# Patient Record
Sex: Male | Born: 1940 | Race: White | Hispanic: No | Smoking: Never smoker
Health system: Southern US, Community
[De-identification: ages and names within clinical notes are randomized; demographics above are authoritative.]

## PROBLEM LIST (undated history)

## (undated) DIAGNOSIS — I4891 Unspecified atrial fibrillation: Secondary | ICD-10-CM

## (undated) DIAGNOSIS — G629 Polyneuropathy, unspecified: Secondary | ICD-10-CM

## (undated) DIAGNOSIS — E119 Type 2 diabetes mellitus without complications: Secondary | ICD-10-CM

## (undated) DIAGNOSIS — Z8719 Personal history of other diseases of the digestive system: Secondary | ICD-10-CM

## (undated) DIAGNOSIS — I10 Essential (primary) hypertension: Secondary | ICD-10-CM

## (undated) DIAGNOSIS — I429 Cardiomyopathy, unspecified: Secondary | ICD-10-CM

## (undated) DIAGNOSIS — F419 Anxiety disorder, unspecified: Secondary | ICD-10-CM

## (undated) DIAGNOSIS — Z7901 Long term (current) use of anticoagulants: Secondary | ICD-10-CM

## (undated) HISTORY — DX: Type 2 diabetes mellitus without complications: E11.9

## (undated) HISTORY — DX: Unspecified atrial fibrillation: I48.91

## (undated) HISTORY — DX: Cardiomyopathy, unspecified: I42.9

## (undated) HISTORY — PX: COLONOSCOPY W/ POLYPECTOMY: SHX1380

## (undated) HISTORY — DX: Long term (current) use of anticoagulants: Z79.01

## (undated) HISTORY — DX: Essential (primary) hypertension: I10

## (undated) HISTORY — DX: Personal history of other diseases of the digestive system: Z87.19

## (undated) HISTORY — PX: TUMOR EXCISION: SHX421

## (undated) HISTORY — DX: Anxiety disorder, unspecified: F41.9

---
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
## 1969-03-11 HISTORY — PX: BACK SURGERY: SHX140

## 2008-12-13 ENCOUNTER — Ambulatory Visit (HOSPITAL_COMMUNITY): Admission: RE | Admit: 2008-12-13 | Discharge: 2008-12-13 | Payer: Self-pay | Admitting: Family Medicine

## 2009-03-11 DIAGNOSIS — Z8719 Personal history of other diseases of the digestive system: Secondary | ICD-10-CM

## 2009-03-11 HISTORY — DX: Personal history of other diseases of the digestive system: Z87.19

## 2009-08-18 ENCOUNTER — Ambulatory Visit (HOSPITAL_COMMUNITY): Admission: RE | Admit: 2009-08-18 | Discharge: 2009-08-18 | Payer: Self-pay | Admitting: Family Medicine

## 2009-08-24 ENCOUNTER — Ambulatory Visit (HOSPITAL_COMMUNITY): Admission: RE | Admit: 2009-08-24 | Discharge: 2009-08-24 | Payer: Self-pay | Admitting: Family Medicine

## 2009-09-10 ENCOUNTER — Ambulatory Visit: Payer: Self-pay | Admitting: Cardiovascular Disease

## 2009-09-10 ENCOUNTER — Inpatient Hospital Stay (HOSPITAL_COMMUNITY): Admission: EM | Admit: 2009-09-10 | Discharge: 2009-09-15 | Payer: Self-pay | Admitting: Emergency Medicine

## 2009-09-12 ENCOUNTER — Encounter (INDEPENDENT_AMBULATORY_CARE_PROVIDER_SITE_OTHER): Payer: Self-pay | Admitting: Family Medicine

## 2009-09-15 ENCOUNTER — Encounter (INDEPENDENT_AMBULATORY_CARE_PROVIDER_SITE_OTHER): Payer: Self-pay | Admitting: *Deleted

## 2009-09-15 LAB — CONVERTED CEMR LAB
ALT: 15 units/L
AST: 21 units/L
Alkaline Phosphatase: 63 units/L
BUN: 18 mg/dL
Basophils Absolute: 0.1 10*3/uL
Basophils Relative: 1 %
Brain Natriuretic Peptide: 106
Eosinophils Absolute: 0.3 10*3/uL
Eosinophils Relative: 4 %
GFR calc non Af Amer: >60 mL/min
Glomerular Filtration Rate, Af Am: >60 mL/min/{1.73_m2}
Glucose, Bld: 102 mg/dL
HDL: 35 mg/dL
Hemoglobin: 15.4 g/dL
LDL (calc): 52 mg/dL
MCHC: 32.3 g/dL
MCV: 83.8 fL
Monocytes Absolute: 1.3 10*3/uL
RBC: 5.7 M/uL

## 2009-10-12 ENCOUNTER — Encounter (INDEPENDENT_AMBULATORY_CARE_PROVIDER_SITE_OTHER): Payer: Self-pay | Admitting: *Deleted

## 2009-10-19 ENCOUNTER — Ambulatory Visit: Payer: Self-pay | Admitting: Cardiology

## 2009-10-19 DIAGNOSIS — F411 Generalized anxiety disorder: Secondary | ICD-10-CM | POA: Insufficient documentation

## 2009-10-20 ENCOUNTER — Encounter: Payer: Self-pay | Admitting: Cardiology

## 2009-11-09 ENCOUNTER — Ambulatory Visit: Payer: Self-pay | Admitting: Cardiology

## 2009-11-30 LAB — CONVERTED CEMR LAB: Prothrombin Time: 24.2 s

## 2009-12-01 LAB — CONVERTED CEMR LAB
Calcium: 9.3 mg/dL
Potassium: 4.5 meq/L
Sodium: 142 meq/L

## 2009-12-18 ENCOUNTER — Ambulatory Visit: Payer: Self-pay | Admitting: Cardiology

## 2009-12-18 ENCOUNTER — Encounter (INDEPENDENT_AMBULATORY_CARE_PROVIDER_SITE_OTHER): Payer: Self-pay | Admitting: *Deleted

## 2009-12-18 LAB — CONVERTED CEMR LAB
ALT: 19 units/L (ref 0–53)
AST: 24 units/L (ref 0–37)
Albumin: 4.7 g/dL (ref 3.5–5.2)
Alkaline Phosphatase: 62 units/L (ref 39–117)
Potassium: 4.5 meq/L (ref 3.5–5.3)
Sodium: 142 meq/L (ref 135–145)
Total Protein: 6.9 g/dL (ref 6.0–8.3)

## 2009-12-20 ENCOUNTER — Observation Stay (HOSPITAL_COMMUNITY): Admission: AD | Admit: 2009-12-20 | Discharge: 2009-12-20 | Payer: Self-pay | Admitting: Cardiology

## 2009-12-20 ENCOUNTER — Encounter: Payer: Self-pay | Admitting: Cardiology

## 2009-12-20 ENCOUNTER — Telehealth (INDEPENDENT_AMBULATORY_CARE_PROVIDER_SITE_OTHER): Payer: Self-pay | Admitting: *Deleted

## 2009-12-21 ENCOUNTER — Encounter (INDEPENDENT_AMBULATORY_CARE_PROVIDER_SITE_OTHER): Payer: Self-pay | Admitting: *Deleted

## 2009-12-25 ENCOUNTER — Encounter (INDEPENDENT_AMBULATORY_CARE_PROVIDER_SITE_OTHER): Payer: Self-pay | Admitting: *Deleted

## 2009-12-27 ENCOUNTER — Encounter (INDEPENDENT_AMBULATORY_CARE_PROVIDER_SITE_OTHER): Payer: Self-pay | Admitting: *Deleted

## 2009-12-27 ENCOUNTER — Ambulatory Visit: Payer: Self-pay | Admitting: Cardiology

## 2009-12-28 ENCOUNTER — Encounter (INDEPENDENT_AMBULATORY_CARE_PROVIDER_SITE_OTHER): Payer: Self-pay | Admitting: *Deleted

## 2009-12-29 ENCOUNTER — Telehealth (INDEPENDENT_AMBULATORY_CARE_PROVIDER_SITE_OTHER): Payer: Self-pay | Admitting: *Deleted

## 2010-01-02 ENCOUNTER — Encounter: Payer: Self-pay | Admitting: Cardiology

## 2010-01-02 ENCOUNTER — Encounter (INDEPENDENT_AMBULATORY_CARE_PROVIDER_SITE_OTHER): Payer: Self-pay | Admitting: *Deleted

## 2010-01-02 LAB — CONVERTED CEMR LAB
Eosinophils Absolute: 0.3 10*3/uL
Eosinophils Relative: 0.3 %
Hemoglobin: 15.2 g/dL
Lymphs Abs: 2.9 10*3/uL
MCHC: 32.8 g/dL
RBC: 5.57 M/uL
UIBC: 340 ug/dL

## 2010-01-05 ENCOUNTER — Encounter (INDEPENDENT_AMBULATORY_CARE_PROVIDER_SITE_OTHER): Payer: Self-pay | Admitting: *Deleted

## 2010-01-05 LAB — CONVERTED CEMR LAB
Basophils Absolute: 0 10*3/uL (ref 0.0–0.1)
Hemoglobin: 15.2 g/dL (ref 13.0–17.0)
Lymphocytes Relative: 35 % (ref 12–46)
Lymphs Abs: 2.9 10*3/uL (ref 0.7–4.0)
Monocytes Absolute: 0.9 10*3/uL (ref 0.1–1.0)
Monocytes Relative: 10 % (ref 3–12)
Neutro Abs: 4.2 10*3/uL (ref 1.7–7.7)
RBC: 5.57 M/uL (ref 4.22–5.81)
RDW: 15.2 % (ref 11.5–15.5)
Saturation Ratios: 16 % — ABNORMAL LOW (ref 20–55)
TIBC: 404 ug/dL (ref 215–435)
WBC: 8.3 10*3/uL (ref 4.0–10.5)

## 2010-01-11 ENCOUNTER — Ambulatory Visit: Payer: Self-pay | Admitting: Cardiology

## 2010-01-11 ENCOUNTER — Ambulatory Visit (HOSPITAL_COMMUNITY): Admission: RE | Admit: 2010-01-11 | Discharge: 2010-01-11 | Payer: Self-pay | Admitting: Cardiology

## 2010-01-12 ENCOUNTER — Telehealth (INDEPENDENT_AMBULATORY_CARE_PROVIDER_SITE_OTHER): Payer: Self-pay

## 2010-01-23 ENCOUNTER — Ambulatory Visit: Payer: Self-pay | Admitting: Internal Medicine

## 2010-01-23 ENCOUNTER — Encounter: Payer: Self-pay | Admitting: Cardiology

## 2010-01-25 ENCOUNTER — Encounter (INDEPENDENT_AMBULATORY_CARE_PROVIDER_SITE_OTHER): Payer: Self-pay | Admitting: *Deleted

## 2010-01-25 ENCOUNTER — Ambulatory Visit: Payer: Self-pay | Admitting: Cardiology

## 2010-02-09 ENCOUNTER — Encounter: Payer: Self-pay | Admitting: Cardiology

## 2010-02-09 ENCOUNTER — Ambulatory Visit: Payer: Self-pay | Admitting: Cardiology

## 2010-02-12 LAB — CONVERTED CEMR LAB
ALT: 18 U/L
AST: 21 U/L
Albumin: 4.7 g/dL
Alkaline Phosphatase: 61 U/L
BUN: 12 mg/dL
Basophils Absolute: 0 K/uL
Basophils Relative: 1 %
CO2: 30 meq/L
Calcium: 9.5 mg/dL
Chloride: 101 meq/L
Creatinine, Ser: 0.93 mg/dL
Eosinophils Absolute: 0.2 K/uL
Eosinophils Relative: 3 %
Glucose, Bld: 103 mg/dL — ABNORMAL HIGH
HCT: 48.5 %
Hemoglobin: 15.7 g/dL
Lymphocytes Relative: 28 %
Lymphs Abs: 1.9 K/uL
MCHC: 32.4 g/dL
MCV: 86.3 fL
Monocytes Absolute: 0.6 K/uL
Monocytes Relative: 9 %
Neutro Abs: 3.9 K/uL
Neutrophils Relative %: 59 %
Platelets: 209 K/uL
Potassium: 4.6 meq/L
RBC: 5.62 M/uL
RDW: 14.1 %
Sodium: 141 meq/L
Total Bilirubin: 0.7 mg/dL
Total Protein: 6.6 g/dL
WBC: 6.7 10*3/microliter

## 2010-02-15 ENCOUNTER — Ambulatory Visit: Payer: Self-pay | Admitting: Internal Medicine

## 2010-02-15 ENCOUNTER — Ambulatory Visit (HOSPITAL_COMMUNITY)
Admission: RE | Admit: 2010-02-15 | Discharge: 2010-02-15 | Payer: Self-pay | Source: Home / Self Care | Attending: Internal Medicine | Admitting: Internal Medicine

## 2010-02-22 ENCOUNTER — Inpatient Hospital Stay (HOSPITAL_COMMUNITY)
Admission: EM | Admit: 2010-02-22 | Discharge: 2010-02-24 | Payer: Self-pay | Source: Home / Self Care | Attending: Family Medicine | Admitting: Family Medicine

## 2010-03-07 ENCOUNTER — Encounter (INDEPENDENT_AMBULATORY_CARE_PROVIDER_SITE_OTHER): Payer: Self-pay | Admitting: *Deleted

## 2010-03-14 ENCOUNTER — Ambulatory Visit
Admission: RE | Admit: 2010-03-14 | Discharge: 2010-03-14 | Payer: Self-pay | Source: Home / Self Care | Attending: Cardiology | Admitting: Cardiology

## 2010-03-14 ENCOUNTER — Encounter (INDEPENDENT_AMBULATORY_CARE_PROVIDER_SITE_OTHER): Payer: Self-pay | Admitting: *Deleted

## 2010-03-15 ENCOUNTER — Ambulatory Visit (HOSPITAL_COMMUNITY)
Admission: RE | Admit: 2010-03-15 | Discharge: 2010-03-15 | Payer: Self-pay | Source: Home / Self Care | Attending: Cardiology | Admitting: Cardiology

## 2010-03-15 ENCOUNTER — Encounter: Payer: Self-pay | Admitting: Cardiology

## 2010-04-10 NOTE — Letter (Signed)
Summary: [place] [facility] Work Engineer, agricultural at [employer]  [address], [place] [address]   Phone: [phone]  Fax: [phone]     January 25, 2010 MRN: [id number]   Wayne Middleton [address] [place], [place]  [address]      YOUR LAB WORK IS DUE   February 12, 2010  Please go to [facility], located across the street from [facility] on the second floor.  Hours are Monday - Friday 7am until 7:30pm         Saturday 8am until 12noon    __  DO NOT EAT OR DRINK AFTER MIDNIGHT EVENING PRIOR TO LABWORK  _X_ YOUR LABWORK IS NOT FASTING --YOU MAY EAT PRIOR TO LABWORK

## 2010-04-10 NOTE — Letter (Signed)
Summary: Cardioversion/TEE Catering manager at [place]  [address], [place] [address]   Phone: [phone]  Fax: [phone]    Cardioversion / TEE Cardioversion Instructions  12/18/2009 MRN: [id number]  Wayne Middleton [address] [place], [place]  [address]  Dear Wayne Middleton, You are scheduled for a Cardioversion on December 20, 2009 with Dr. Dietrich Pates.   Please arrive at the [facility] at 8:30 a.m.  on the day of your procedure.  1)   DIET:  A)   Nothing to eat or drink after midnight except your medications with a sip of water.         2)   MAKE SURE YOU TAKE YOUR COUMADIN.    B)   YOU MAY TAKE ALL of your remaining medications with a small amount of water.    C)   START NEW medications:       TAKE AN EXTRA DOSE WARFARIN ON MONDAY NIGHT 12/18/09  5)  Must have a responsible person to drive you home.  6)   Bring a current list of your medications and current insurance cards.   * Special Note:  Every effort is made to have your procedure done on time. Occasionally there are emergencies that present themselves at the hospital that may cause delays. Please be patient if a delay does occur.  * If you have any questions after you get home, please call the office at [phone].

## 2010-04-10 NOTE — Letter (Signed)
Summary: CARDIOVERSION ORDERS  CARDIOVERSION ORDERS   Imported By: Faythe Ghee 12/20/2009 10:49:57  _____________________________________________________________________  External Attachment:    Type:   Image     Comment:   External Document

## 2010-04-10 NOTE — Assessment & Plan Note (Signed)
Summary: 2 WEEK RHYTHM STRIP  Nurse Visit   Vital Signs:  Patient profile:   70 year old male Height:      70 inches Weight:      201 pounds O2 Sat:      98 % on Room air Temp:     97.1 degrees F oral Pulse rate:   76 / minute Pulse rhythm:   irregular BP sitting:   152 / 97  (left arm)  Vitals Entered By: Teressa Lower RN (January 25, 2010 9:26 AM)  O2 Flow:  Room air  Current Medications (verified): 1)  Klor-Con 20 Meq Pack (Potassium Chloride) .... Take 1 Tab Daily 2)  Diazepam 10 Mg Tabs (Diazepam) .... Take Prn 3)  Furosemide 20 Mg Tabs (Furosemide) .... Take 1 Tab Daily 4)  Zolpidem Tartrate 10 Mg Tabs (Zolpidem Tartrate) .... Take 1 Tab At Bedtime 5)  Warfarin Sodium 5 Mg Tabs (Warfarin Sodium) .... Take As Directed Per [facility] 6)  Atenolol 100 Mg Tabs (Atenolol) .... Take 1 Tablet By Mouth Once Daily 7)  Cozaar 100 Mg Tabs (Losartan Potassium) .... Take 1 Tablet By Mouth Once Daily  Allergies (verified): No Known Drug Allergies  Visit Type:  2 week visit Primary Provider:  Dr.McInnis   History of Present Illness: S: 2 week nurse visit B: ov on 01/11/2010, stopped metoprolol and lisinopril, started atenolol  and diovan and cxr A: pt denies complaints, rhythm strip obtained irregular rhythm, started all new meds without problems     pt scheduled for colonsocopy and egd on 02/15/2010 R:  01/26/10 Increase atenolol to 150 mg q.d. Amlodipine 5 mg p.o. q.d. Home BP determinations Blood-pressure check in 2 weeks  [place] Bing, M.D.   I spoke with pt, verbalized understanding of meds and nurse visit   Teressa Lower RN  January 26, 2010 11:28 AM   Prescriptions: AMLODIPINE BESYLATE 5 MG TABS (AMLODIPINE BESYLATE) Take one tablet by mouth daily  #30 x 3   Entered by:   Teressa Lower RN   Authorized by:   Kathlen Brunswick, MD, [facility]   Signed by:   Teressa Lower RN on 01/26/2010   Method used:   [facility]        [phone]  [facility]retail[facility]       [address]       [place], [place]  [address]       Ph: [phone] or [phone]       Fax: [phone]   RxID:   [id number] ATENOLOL 100 MG TABS (ATENOLOL) take 1 1/2 tablets by mouth daily  #45 x 3   Entered by:   Teressa Lower RN   Authorized by:   Kathlen Brunswick, MD, [facility]   Signed by:   Teressa Lower RN on 01/26/2010   Method used:   Electronically to        [facility] [phone]* (retail)       [address]       [place], [place]  [address]       Ph: [phone] or [phone]       Fax: [phone]   RxID:   [id number]

## 2010-04-10 NOTE — Miscellaneous (Signed)
Summary: labs iron,ibc,cbcd,ferritin,01/02/2010  Clinical Lists Changes  Observations: Added new observation of IRON SATUR %: 16 % (01/02/2010 10:11) Added new observation of TIBC: 404 mcg/dL (16/12/9602 54:09) Added new observation of UIBC: 340 mcg/dL (81/19/1478 29:56) Added new observation of IRON: 64 mcg/dL (21/30/8657 84:69) Added new observation of BASOPHIL %: 0.0 % (01/02/2010 10:11) Added new observation of EOS ABSLT: 0.3 K/uL (01/02/2010 10:11) Added new observation of % EOS AUTO: 0.3 % (01/02/2010 10:11) Added new observation of ABSOLUTE MON: 0.9 K/uL (01/02/2010 10:11) Added new observation of MONOCYTE %: 10 % (01/02/2010 10:11) Added new observation of ABS LYMPHOCY: 2.9 K/uL (01/02/2010 10:11) Added new observation of LYMPHS %: 35 % (01/02/2010 10:11) Added new observation of PLATELETK/UL: 184 K/uL (01/02/2010 10:11) Added new observation of RDW: 15.2 % (01/02/2010 10:11) Added new observation of MCHC RBC: 32.8 g/dL (62/95/2841 32:44) Added new observation of MCV: 83.3 fL (01/02/2010 10:11) Added new observation of HCT: 46.4 % (01/02/2010 10:11) Added new observation of HGB: 15.2 g/dL (03/13/7251 66:44) Added new observation of RBC M/UL: 5.57 M/uL (01/02/2010 10:11) Added new observation of WBC COUNT: 8.3 10*3/microliter (01/02/2010 10:11)

## 2010-04-10 NOTE — Letter (Signed)
Summary: BP READING  BP READING   Imported By: Faythe Ghee 02/09/2010 13:01:17  _____________________________________________________________________  External Attachment:    Type:   Image     Comment:   External Document

## 2010-04-10 NOTE — Assessment & Plan Note (Signed)
Summary: 3WK/RM  Nurse Visit   Vital Signs:  Patient profile:   70 year old male Height:      70 inches Weight:      204 pounds O2 Sat:      96 % on Room air Temp:     97.2 degrees F Pulse rate:   96 / minute BP sitting:   146 / 96  (left arm)  Vitals Entered By: Teressa Lower RN (November 09, 2009 9:14 AM)  O2 Flow:  Room air  Visit Type:  3 week nurse visit Primary Provider:  Dr.McInnis   History of Present Illness: S: 3 week nurse visit B: ov on 10/19/09, stopped dig, increased lisinopril 20mg  daily, increased metoprolol 50mg  three times a day started on 10/26/09 A: no c/o, pt has white coat syndrone, did not take meds this am, pt takes valium, averages 2 a week, walked 1 1/2 miles yesterday bp 136/85, hr 89 after walk.bp diary scanned into chart. R:  11/14/09  Continue current medication; followup as planned.  [place] Bing, M.D.   Pt. advised.       Larita Fife Via LPN  November 14, 2009 4:54 PM     Current Medications (verified): 1)  Lisinopril 20 Mg Tabs (Lisinopril) .... Take One Tablet By Mouth Daily 2)  Metoprolol Tartrate 50 Mg Tabs (Metoprolol Tartrate) .... Take 1 Tablet By Mouth Three Times A Day 3)  Klor-Con 20 Meq Pack (Potassium Chloride) .... Take 1 Tab Two Times A Day 4)  Diazepam 10 Mg Tabs (Diazepam) .... Take Prn 5)  Furosemide 40 Mg Tabs (Furosemide) .... Take One Tablet By Mouth Daily. 6)  Zolpidem Tartrate 10 Mg Tabs (Zolpidem Tartrate) .... Take 1 Tab At Bedtime 7)  Warfarin Sodium 5 Mg Tabs (Warfarin Sodium) .... Take As Directed Per [facility]  Allergies (verified): No Known Drug Allergies

## 2010-04-10 NOTE — Assessment & Plan Note (Signed)
Summary: Post-hospital; new-onset atrial fibrillation and CHF   Visit Type:  Initial Office Visit Primary Provider:  Dr.Mcinnis   History of Present Illness: Wayne Middleton is seen in the office following a recent hospital admission for atrial fibrillation and congestive heart failure.  Patient had not previously been evaluated by a cardiologist nor had he undergone any significant cardiac testing.   Echocardiography showed moderately impaired left ventricular systolic function with an apical wall motion abnormality.  A stress nuclear study showed possible scar at the apex and at the base of the inferior wall.  He was treated with digoxin and metoprolol for control of heart rate and diuresis with resolution of his symptoms.  He is walking 1 mile a day, which is decreased from his previous level of 4 miles per day, and doing strenuous yard work without difficulty.  He denies edema, orthopnea, PND, lightheadedness or syncope.  He notes no palpitations.  He reports a rare episode of very localized left chest discomfort that is mild and transient, not associated with exertion nor accompanied by other symptoms.  Hospital records and Dr. Lorenso Courier records were obtained and reviewed.    EKG  Procedure date:  11/04/2009  Findings:      Atrial fibrillation with controlled ventricular response. Indeterminant axis Delayed R-wave progression consistent with previous ASMI Nonspecific ST-T wave abnormality Comparison to prior tracing of 09/04/09: AF now present; no change in evidence for prior ASM I  -  Date:  09/15/2009    SGOT (AST): 21    SGPT (ALT): 15    T. Bilirubin: 1.1    Alk Phos: 63    Total Protein: 6.4    Albumin: 3.8    Cholesterol: 113    LDL-calculated: 52    HDL: 35    Triglycerides: 562    BNP: 106   Current Medications (verified): 1)  Lisinopril 10 Mg Tabs (Lisinopril) .... Take 2 Tablets By Mouth Daily 2)  Metoprolol Tartrate 25 Mg Tabs (Metoprolol Tartrate) .... Take2   Tablets   Three Times A Day Until Gone Start 10/26/09 3)  Klor-Con 20 Meq Pack (Potassium Chloride) .... Take 1 Tab Two Times A Day 4)  Diazepam 10 Mg Tabs (Diazepam) .... Take Prn 5)  Furosemide 20 Mg Tabs (Furosemide) .... Take 2 Tablets By Orbie Hurst Daily 6)  Zolpidem Tartrate 10 Mg Tabs (Zolpidem Tartrate) .... Take 1 Tab At Bedtime 7)  Warfarin Sodium 5 Mg Tabs (Warfarin Sodium) .... Take As Directed Per [facility]mad[facility] Clinic  Allergies (verified): No Known Drug Allergies  Past History:  Family History: Last updated: 11-04-2009 Father died at age 24 as the result of myocardial infarction Mother died at age 32 following a CVA 8 siblings: 3 of 5 brothers deceased due to carcinoma of the lung; one of 3 sisters died at age 21, also with neoplastic disease  Social History: Last updated: 11/04/2009 Retired from [employer] Tobacco Use - No.  Alcohol Use - no Regular Exercise -previously walked 4 miles daily Drug Use - no  PMH, FH, and Social History reviewed and updated.  Past Medical History: Atrial fibrillation; onset after 08/2009; associated with CHF; EF of 35% by echo and 23% by nuclear      techniques; poss. 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  Alcohol Use - no Regular Exercise -previously walked 4 miles daily Drug Use - no  Review of Systems        few palpitations; occasional diarrhea; urinary frequency; arthritic discomfort of the knees.  All other  systems reviewed and are negative.  Vital Signs:  Patient profile:   70 year old male Height:      70 inches Weight:      205 pounds BMI:     29.52 Pulse rate:   85 / minute BP sitting:   160 / 97  (right arm)  Vitals Entered By: Dreama Saa, CNA (October 19, 2009 1:42 PM)  Physical Exam  General:  Well developed; no acute distress; portionate weight and height Neck-No JVD; no carotid bruits: Lungs-No tachypnea, no rales; no rhonchi; no wheezes Cardiovascular-irregular rhythm; normal PMI; normal S1 and S2; minimal systolic murmur Abdomen-BS normal; soft and non-tender without masses or organomegaly:  Musculoskeletal-No deformities, no cyanosis or clubbing: Neurologic-Normal cranial nerves; symmetric strength and tone:  Skin-Warm, no significant lesions: Extremities-Nl distal pulses; no edema:     Impression & Recommendations:  Problem # 1:  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   Problem # 2:  HYPERTENSION (ICD-401.1) Due to continuing hypertension, lisinopril will be increased to 20 mg q.d.   Problem # 3:  ATRIAL FIBRILLATION (ICD-427.31) Digoxin is likely superfluous and will be discontinued.  Beta blocker will be increased to 300 mg q.d. and switched to a long-acting preparation taken once a day.  Blood pressure and heart rate will be checked in 3 weeks.  I will plan to see this nice gentleman again in 2 months.  Patient Instructions: 1)  Your physician recommends that you schedule a follow-up appointment in: 2 months 2)  Your physician has recommended you make the following change in your medication: stop digoxin,increase lisinopril to 20mg  daily increase  metoprolol to 2 tablets by mouth three times a day 3)  start on 10/26/09 4)  You have been referred to nurse visit in 3 weeks bring bp diary to nurse visit 5)  Your physician has requested that you regularly monitor and record your blood pressure  readings at home.  Please use the same machine at the same time of day to check your readings and record them to bring to your follow-up visit.

## 2010-04-10 NOTE — Progress Notes (Signed)
Summary: Requesting different medication  Phone Note Call from Patient Call back at Home Phone [phone]   Caller: pt Reason for Call: Talk to Nurse Summary of Call: S: Pt was given new BP pils that he cannot afford them, would like to know if he could be switched to cozaar or norvasc to be able to afford them. [facility] [place] [phone]. Initial call taken by: Faythe Ghee,  January 12, 2010 10:06 AM  Follow-up for Phone Call        B: on last OV with Dr. Dietrich Pates on 11-3, pt. was advised to stop taking Metoprolol and Lisinopril and to start taking Atenolol 100mg   and Diovan 320mg  once daily.  A: Pt. wants "cheaper medications" if possible. R: Will call with Dr. Marvel Plan recommendatons.  Follow-up by: Larita Fife Via, LPN  Additional Follow-up for Phone Call Additional follow up Details #1::        Atenolol should be a $4 drug. Diovan can be changed to Cozaar 100 mg q.d. Additional Follow-up by: Kathlen Brunswick, MD, [facility],  January 14, 2010 8:58 PM    Additional Follow-up for Phone Call Additional follow up Details #2::    Pt. advised[facility] (see OV from 11-3). Follow-up by: Larita Fife Via LPN,  January 15, 2010 11:23 AM

## 2010-04-10 NOTE — Progress Notes (Signed)
Summary: updated med list  Phone Note Call from Patient Call back at Home Phone [phone]   Caller: pt Reason for Call: Talk to Nurse Summary of Call: pt needs his records updated that he takes metoprolol 50mg  twice a day and a copy sent to him of med list. Initial call taken by: Faythe Ghee,  December 20, 2009 3:04 PM    New/Updated Medications: METOPROLOL TARTRATE 50 MG TABS (METOPROLOL TARTRATE) Take one tablet by mouth twice a day

## 2010-04-10 NOTE — Letter (Signed)
Summary: BP LOG  BP LOG   Imported By: Faythe Ghee 11/09/2009 10:53:56  _____________________________________________________________________  External Attachment:    Type:   Image     Comment:   External Document

## 2010-04-10 NOTE — Assessment & Plan Note (Addendum)
Summary: .RM   Visit Type:  Follow-up Primary Provider:  Dr.McInnis   History of Present Illness: [name] returns to the office as scheduled for continued assessment and treatment of newly diagnosed atrial fibrillation and cardiomyopathy with congestive heart failure.  Since his last office visit, he has done superbly.  He has chosen to limit activity, but is able to walk 2-3 miles without difficulty.  He denies orthopnea, PND, lightheadedness, syncope and peripheral edema.  Furosemide dosage was decreased at a recent office visit with Dr. Renard Matter.  Patient has been monitoring his weight and notes no change.  Current Medications (verified): 1)  Metoprolol Tartrate 100 Mg Tabs (Metoprolol Tartrate) .... Take One Tablet By Mouth Twice A Day 2)  Klor-Con 20 Meq Pack (Potassium Chloride) .... Take 1 Tab Daily 3)  Diazepam 10 Mg Tabs (Diazepam) .... Take Prn 4)  Furosemide 40 Mg Tabs (Furosemide) .... Take One Tablet By Mouth Daily. 5)  Zolpidem Tartrate 10 Mg Tabs (Zolpidem Tartrate) .... Take 1 Tab At Bedtime 6)  Warfarin Sodium 5 Mg Tabs (Warfarin Sodium) .... Take As Directed Per [facility]madin Clinic 7)  Lisinopril 40 Mg Tabs (Lisinopril) .... Take One Tablet By Mouth Daily  Allergies (verified): No Known Drug Allergies  Comments:  Nurse/Medical Assistant: patient stated Dr.Mcinnis has took him off of furosemide 40 mg two times a day and told him to take it only 1 time daily also cut back on his potassium to 1 tab daily had lads done at Dr.Mcinnis last week called for labs   Past History:  PMH, FH, and Social History reviewed and updated.  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  Vital Signs:  Patient profile:   70 year old male Weight:      203 pounds BMI:     29.23 Pulse rate:   99 / minute BP sitting:    146 / 86  (right arm)  Vitals Entered By: Dreama Saa, CNA (December 18, 2009 1:15 PM)  Physical Exam  General:  Well developed; no acute distress; proportionate weight and height; weight of 203 represents a 1 pound decrease since his last visit Neck-No JVD; no carotid bruits: Lungs-No tachypnea, no rales; no rhonchi; no wheezes Cardiovascular-irregular rhythm; normal PMI; normal S1 and S2; minimal systolic murmur Abdomen-BS normal; soft and non-tender without masses or organomegaly:  Musculoskeletal-No deformities, no cyanosis or clubbing: Neurologic-Normal cranial nerves; symmetric strength and tone:  Skin-Warm, no significant lesions: Extremities-Nl distal pulses; no edema:     Impression & Recommendations:  Problem # 1:  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  Nuclear stress test indicated the presence of scarring, which suggests that cardiomyopathy preceded atrial fibrillation and that a tachycardia-mediated myocardial process is less likely.  Ejection fraction on echo was borderline to suggest the need for an AICD.  Medical therapy will be optimized and an echocardiogram repeated before that decision is finalized.  Problem # 2:  HYPERTENSION (ICD-401.1) Blood pressure is on the high side, particularly in light of the patient's cardiomyopathic process.  Metoprolol will be increased to achieve better control of heart rate, and lisinopril dosage will be doubled.  Problem # 3:  ATRIAL FIBRILLATION (ICD-427.31) Cardioversion will be planned for later this week.  Records obtained from Dr. Renard Matter' office indicates stable and therapeutic anticoagulation.  INR today was 2.3.  His  dose of warfarin will be slightly increased, as an INR of 2.5 is more desirable for the day of cardioversion.  Hemoccult testing will be performed in light of his chronic anticoagulation.  I will reassess this nice gentleman 3 weeks following his procedure, if sinus rhythm is successfully  restored.  Other Orders: Hemoccult Cards (Take Home) (Hemoccult Cards) Cardioversion (Cardioversion) T-Comprehensive Metabolic Panel [phone])  Patient Instructions: 1)  Your physician recommends that you schedule a follow-up appointment in: 3 weeks after cardioversion 2)  Your physician has recommended you make the following change in your medication: increase metoprolol to 100mg  two times a day, increase lisinopril to 40mg  daily 3)  Your physician has requested that you have a Cardioversion.  During a TEE, sound waves are used to create images of your heart. It provides your doctor with information about the size and shape of your heart and how well your heart's chambers and valves are working. In this test, a transducer is attached to the end of a flexible tube that is guided down your throat and into your esophagus (the tube leading from your mouth to your stomach) to get a more detailed image of your heart. Once the TEE has determined that a blood clot is not present, the cardioversion begins.  Electrical cardioversion uses a jolt of electricity to your heart either through paddles or wired patches attached to your chest. This is a controlled, usually prescheduled, procedure. This procedure is done at the hospital and you are not awake during the procedure.  You usually go home the day of the procedure. Please see the instruction sheet given to you today for more information. 4)  Your physician has asked that you test your stool for blood. It is necessary to test 3 different stool specimens for accuracy. You will be given 3 hemoccult cards for specimen collection. For each stool specimen, place a small portion of stool sample (from 2 different areas of the stool) into the 2 squares on the card. Close card. Repeat with 2 more stool specimens. Bring the cards back to the office for testing. Prescriptions: LISINOPRIL 40 MG TABS (LISINOPRIL) Take one tablet by mouth daily  #30 x 3   Entered by:    Teressa Lower RN   Authorized by:   Kathlen Brunswick, MD, [facility]   Signed by:   Teressa Lower RN on 12/18/2009   Method used:   Electronically to        [facility] [phone][facility] (retail)       [address]       [place], [place]  [address]       Ph: [phone] or [phone]       Fax: [phone]   RxID:   [id number] METOPROLOL TARTRATE 100 MG TABS (METOPROLOL TARTRATE) Take one tablet by mouth twice a day  #60 x 3   Entered by:   Teressa Lower RN   Authorized by:   Kathlen Brunswick, MD, [facility]   Signed by:   Teressa Lower RN on 12/18/2009   Method used:   Electronically to        [facility] [phone]* (retail)       [address]       [place], [place]  [address]       Ph: [phone] or [phone]       Fax: [phone]   RxID:  [id number]   Appended Document: Records - Dr. Renard Matter    Clinical Lists Changes  Problems: Assessed CARDIOMYOPATHY as comment only - CT Scan of the chest reviewed with the radiologist, Dr. Tyron Russell.  There is minimal calcification in the thoracic aorta and no appreciable calcification of the coronary arteries.  This reduces the likelihood of an ischemic cardiomyopathy and it decreases the likelihood that cardiac catheterization with coronary angiography will be of benefit. 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 Tyron Russell.  There is minimal calcification in the thoracic aorta and no appreciable calcification of the coronary arteries.  This reduces the likelihood of an ischemic cardiomyopathy and it decreases the likelihood that cardiac catheterization with coronary angiography will be of benefit.

## 2010-04-10 NOTE — Letter (Signed)
Summary: Generic Letter  Architectural technologist at [facility]  [address] [place] [address]   Phone: [phone]  Fax: [phone]        December 21, 2009 MRN: [id number]    FARDEEN STEINBERGER [address] [place], [place]  [address]    Dear Mr. BASNETT,   This is the requested list of your medications.  Please look over this list and advise our office of any changes that we need to make in the medications.        Sincerely, Teressa Lower RN  This letter has been electronically signed by your physician.

## 2010-04-10 NOTE — Letter (Signed)
Summary: [place] Results Engineer, agricultural at [facility]  [address], [place] [address]   Phone: [phone]  Fax: [phone]      December 25, 2009 MRN: [id number]   Wayne Middleton [address] [place], [place]  [address]   Dear Mr. SWIM,  Your test ordered by Selena Batten has been reviewed by your physician (or physician assistant) and was found to be normal or stable. Your physician (or physician assistant) felt no changes were needed at this time.  ____ Echocardiogram  ____ Cardiac Stress Test  __x__ Lab Work  ____ Peripheral vascular study of arms, legs or neck  ____ CT scan or X-ray  ____ Lung or Breathing test  ____ Other:  No change in medical treatment at this time, per Dr. Dietrich Pates.  Enclosed is a copy of your labwork for your records.  Thank you, [name] RN    [place] Bing, MD, Lenise Arena.C.Gaylord Shih, MD, F.A.C.C Lewayne Bunting, MD, F.A.C.C Nona Dell, MD, F.A.C.C Charlton Haws, MD, Lenise Arena.C.C

## 2010-04-10 NOTE — Miscellaneous (Signed)
Summary: hemoccult cards 12/27/2009  Clinical Lists Changes  Problems: Added new problem of HEMOCCULT POSITIVE STOOL (ICD-578.1) Orders: Added new Test order of T-CBC w/Diff [phone]) - Signed Added new Test order of T-Iron Binding Capacity (TIBC) ([id number]) - Signed Added new Test order of T-Ferritin [phone]) - Signed Added new Test order of T-Iron ([id number]) - Signed Added new Referral order of Gastroenterology Referral (GI) - Signed Observations: Added new observation of CARDIO HPI: 01/01/2010  Hemoccult + stool X 3  Lab-CBC, iron, iron-binding, ferritin Determine whether Dr. Renard Matter would like to see in office or refer to GI  [place] Bing, M.D.  (12/28/2009 10:29) Added new observation of PRIMARY MD: Dr.McInnis (12/28/2009 10:29) Added new observation of HEMOCCULT 3: pos (12/27/2009 10:29) Added new observation of HEMOCCULT 2: pos (12/27/2009 10:29) Added new observation of HEMOCCULT 1: pos (12/27/2009 10:29)    Faxed results to Dr. Renard Matter and spoke with Nurse in the office to make staff aware, they manage his coumadin also.   Teressa Lower RN  December 29, 2009 9:25 AM Per pt and Dr. Renard Matter pt to see DR. Rehman Teressa Lower RN  January 02, 2010 3:11 PM'  Primary Provider:  Dr.McInnis   History of Present Illness: 01/01/2010  Hemoccult + stool X 3  Lab-CBC, iron, iron-binding, ferritin Determine whether Dr. Renard Matter would like to see in office or refer to GI  [place] Bing, M.D.

## 2010-04-10 NOTE — Assessment & Plan Note (Signed)
Summary: nurse visit per Tammy/tg  Nurse Visit   Vital Signs:  Patient profile:   70 year old male Weight:      197 pounds O2 Sat:      97 % on Room air BP sitting:   146 / 98  (left arm)  Vitals Entered By: Larita Fife Via LPN (February 09, 2010 9:37 AM)  O2 Flow:  Room air   Primary Johanna Stafford:  Dr.McInnis   History of Present Illness: S: Pt. returns to office for a 2 week BP check with nurse. B: On nurse visit on 11-17 pt. was advised to Increase Atenolol to 150mg  once daily, Amlodipine to 5mg  once daily, to monitor BP daily and bring diary to this nurse visit.  A: Pt. has no cardiac complaints at this time. He insists that his BP is not as high as his reading is this morning, BP=146-98. He brought in his BP diary with BP readings lower then this mornings BP (diary in chart). Pt. states he has not taken his BP meds this morning due to he normally take around 9:00 to 9:30, offered to reschedule BP check for afternoon but pt. refused. When he left office, he went by Dr. Renard Matter' office and had BP checked there, BP=110/70 (see document in chart). Pt. states he has been told in the past by previous MD's that he has "white coat HTN". BP at last BP check was 152/97 on 11-17 R: Pt. advised we will contact him with Dr. Marvel Plan recommendations.   Allergies: No Known Drug Allergies No changes. He still has hypertension at times (even if white coat) that needs to be controlled.  I Joni Reining NP  Appended Document: nurse visit per Tammy/tg Pt. advised.  Appended Document: Home BP readings Pt. insisted that I record his home BP readings from 12-2 until present: 12-2=110/70  HR: 77 12-3=121/74  HR: 70 12-4=116/78  HR:85 12-5=108/65  HR: 76 12-6=121/74  HR: 71  Appended Document: nurse visit per Tammy/tg DR Rothbart pt...no change for now but keep BP log and share with [place] in 2 weeks by phone.  Reviewed Juanito Doom, MD

## 2010-04-10 NOTE — Progress Notes (Signed)
Summary: hemocult card results  Phone Note Call from Patient Call back at Home Phone [phone]   Caller: pt Reason for Call: Talk to Nurse Summary of Call: pt want to know about stool cards. please leave on him answering machine he will not be home. Initial call taken by: Faythe Ghee,  December 29, 2009 12:14 PM  Follow-up for Phone Call        lmom Follow-up by: Teressa Lower RN,  December 29, 2009 1:41 PM

## 2010-04-12 NOTE — Letter (Signed)
Summary: [place] [facility] Lab Work Engineer, agricultural at [facility]  [address] [place] [address]   Phone: [phone]  Fax: [phone]     March 14, 2010 MRN: [id number]   FITZPATRICK ALBERICO [address] [place], [place]  [address]      YOUR LAB WORK IS DUE   May 14, 2010  Please go to [facility], located across the street from [facility] on the second floor.  Hours are Monday - Friday 7am until 7:30pm         Saturday 8am until 12noon     _X_ YOUR LABWORK IS NOT FASTING --YOU MAY EAT PRIOR TO LABWORK

## 2010-04-12 NOTE — Assessment & Plan Note (Signed)
Summary: F2M   Visit Type:  Follow-up Primary Provider:  Dr.McInnis   History of Present Illness: Return visit for this pleasant gentleman currently being treated for atrial fibrillation and cardiomyopathy, presumed to be secondary to tachycardia.  Since his last visit, he has done well from a cardiac standpoint.  He denies dyspnea, palpitations, chest discomfort or pedal edema.  Unfortunately, he suffered a significant GI bleed following colonoscopic polypectomy, requiring hospitalization.  Although initially hypotensive, he did not require transfusion, and hemoglobin has reportedly recovered to normal.  [facility] from [facility] were obtained and reviewed.  Rectal bleeding with nausea and abdominal discomfort was first noted on 12/15, one week following colonoscopy with polypectomy.  Hemoglobin decreased from an admission value of 12.6 to a discharge value of 10.3.  INR was 1.36 at the time of admission, only slightly greater than the value immediately preceding his colonoscopy.  Coumadin was held for 2 weeks following his bleed.  Current Medications (verified): 1)  Furosemide 20 Mg Tabs (Furosemide) .... Take 1 Tab Daily 2)  Zolpidem Tartrate 10 Mg Tabs (Zolpidem Tartrate) .... Take 1 Tab At Bedtime 3)  Warfarin Sodium 5 Mg Tabs (Warfarin Sodium) .... Take As Directed Per Coumadin Clinic 4)  Atenolol 100 Mg Tabs (Atenolol) .... Take 50 Mg  Two Times A Day 5)  Klor-Con 20 Meq Pack (Potassium Chloride) .... Take 1 Tab Daily 6)  Diazepam 10 Mg Tabs (Diazepam) .... Take Prn  Allergies (verified): No Known Drug Allergies  Comments:  Nurse/Medical Assistant: patient stopped amlodipine and cozaar on his own patient stopped metoprolol  and diovan on his own.  Past History:  PMH, FH, and Social History reviewed and updated.  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  Vital Signs:  Patient profile:   70 year old male Weight:      195 pounds BMI:     28.08 O2 Sat:      94 % on Room air Pulse rate:   105 / minute BP sitting:   162 / 99  (right arm)  Vitals Entered By: Dreama Saa, CNA (March 14, 2010 11:22 AM)  O2 Flow:  Room air  Physical Exam  General:  Well developed; no acute distress; proportionate weight and height;  Neck-No JVD; no carotid bruits: Lungs-No tachypnea, no rales; no rhonchi; no wheezes Cardiovascular-rapid irregular rhythm; normal PMI; normal S1 and S2; minimal systolic murmur Abdomen-BS normal; soft and non-tender without masses or organomegaly:  Musculoskeletal-No deformities, no cyanosis or clubbing: Neurologic-Normal cranial nerves; symmetric strength and tone:  Skin-Warm, no significant lesions: Extremities-Nl distal pulses; no edema:     Impression & Recommendations:  Problem # 1:  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  Although control of heart rate is slightly suboptimal, it should be adequate to allow resolution of tachycardia-induced cardiomyopathy, if present.  An echocardiogram will be repeated to reassess LV systolic function.  Problem # 2:  LOWER GI BLEED (ICD-578.9) Multiple factors contributed to his lower GI bleeding following polypectomy including treatment with warfarin.  Significant bleeding one week out from the procedure is unusual.  Patient is now stable with appropriate therapy.  Other Orders: 2-D Echocardiogram (2D Echo) Future Orders: T-Basic Metabolic Panel [phone]) ... 05/14/2010  EKG  Procedure date:  03/14/2010  Findings:      Rhythm Strip  Atrial fibrillation with a heart rate of 90 bpm. IVCD.   Patient Instructions: 1)  Your physician recommends that you schedule a follow-up appointment  in: 6 months 2)  Your physician recommends that you return for lab work in:2 months 3)  Your physician has requested that you have an echocardiogram.  Echocardiography is a painless test that uses sound waves to create images of your heart. It provides your doctor with information about the size and shape of your heart and how well your heart's chambers and valves are working.  This procedure takes approximately one hour. There are no restrictions for this procedure.

## 2010-04-12 NOTE — Assessment & Plan Note (Signed)
Summary: 3week followup after cardio/rm   Visit Type:  Follow-up Primary Provider:  Dr.McInnis   History of Present Illness: [name] returns to the office for continued assessment and treatment of atrial fibrillation and possible cardiomyopathy.  Since his last visit, he underwent uncomplicated DC cardioversion.  He noted no change in his sense of well being following that procedure.  He denies orthopnea, PND, chest discomfort, lightheadedness and syncope.  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  He suffered a fall the other day when he tripped, resulting in an abrasion of his left elbow and his shin.  He has been found to have Hemoccult-positive stools and has been referred to a gastroenterologist.  Current Medications (verified): 1)  Klor-Con 20 Meq Pack (Potassium Chloride) .... Take 1 Tab Daily 2)  Diazepam 10 Mg Tabs (Diazepam) .... Take Prn 3)  Furosemide 20 Mg Tabs (Furosemide) .... Take 1 Tab Daily 4)  Zolpidem Tartrate 10 Mg Tabs (Zolpidem Tartrate) .... Take 1 Tab At Bedtime 5)  Warfarin Sodium 5 Mg Tabs (Warfarin Sodium) .... Take As Directed Per Coumadin Clinic 6)  Atenolol 100 Mg Tabs (Atenolol) .... Take 1 Tablet By Mouth Once Daily 7)  Cozaar 100 Mg Tabs (Losartan Potassium) .... Take 1 Tablet By Mouth Once Daily  Allergies (verified): No Known Drug Allergies  Comments:  Nurse/Medical Assistant: patient states that the lisinopril or metoprolol one is making him have a bad cough  stated to him it could possibly be the lisinopril not metoprolol Dr.mcinnis changed furosemide from 40 mg to 20 mg daily patients metoprolol was changed to 50 mg two times a day when he left the hospital from his cardioversion by rothbart.  EKG  Procedure date:  01/11/2010  Findings:      Atrial fibrillation with a rapid ventricular response Heart rate is 115 bpm Right superior axis Borderline low voltage Delayed R-wave progression-cannot  exclude previous septal MI Minor nonspecific ST segment abnormality No previous tracing for comparison.   Past History:  PMH, FH, and Social History reviewed and updated.  Past Medical History: Atrial fibrillation; onset after 08/2009; associated with CHF; EF of 35% by echo and 23% by nuclear      techniques; poss. apical MI by echo and nuclear Chronic anxiety Hypertension Hemoccult-Positive stool-2011  Review of Systems       See history of present illness.  Vital Signs:  Patient profile:   70 year old male Weight:      204 pounds BMI:     29.38 Pulse rate:   70 / minute BP sitting:   148 / 93  (right arm)  Vitals Entered By: Dreama Saa, CNA (January 11, 2010 2:21 PM)  Physical Exam  General:  Well developed; no acute distress; proportionate weight and height;  Neck-No JVD; no carotid bruits: Lungs-No tachypnea, no rales; no rhonchi; no wheezes Cardiovascular-rapid irregular rhythm; normal PMI; normal S1 and S2; minimal systolic murmur Abdomen-BS normal; soft and non-tender without masses or organomegaly:  Musculoskeletal-No deformities, no cyanosis or clubbing: Neurologic-Normal cranial nerves; symmetric strength and tone:  Skin-Warm, no significant lesions: Extremities-Nl distal pulses; no edema:     Impression & Recommendations:  Problem # 1:  ATRIAL FIBRILLATION (ICD-427.31) Atrial fibrillation has recurred within weeks of cardioversion.  Consideration could be given to use of an antiarrhythmic, but this has not been shown to provide significant benefit for asymptomatic patients with AF.  We will pursue a rate control  strategy for the time being.  Patient is experiencing erectile dysfunction with metoprolol and wants to resume atenolol.  A dose of 100 mg q.d will be provided with a reassessment of heart rate during a nursing visit in 2 weeks.  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  Lisinopril is causing a troublesome cough and will be substituted with Diovan 320 mg q.d.  His last chest x-ray in July showed congestive heart failure.  A repeat study will be obtained.  Problem # 3:  HYPERTENSION (ICD-401.1) Hypertension has been somewhat suboptimally controlled.  We will adjust medications to achieve a systolic below 140 and the diastolic below 90.  I will plan to reassess this nice gentleman in 2 months.  Other Orders: T-Chest x-ray, 2 views ([id number]) Future Orders: T-Comprehensive Metabolic Panel ([id number]) ... 02/12/2010 T-CBC w/Diff ([id number]) ... 02/12/2010  Patient Instructions: 1)  Your physician recommends that you schedule a follow-up appointment in: 2 weeks for a rhythm strip with nurse and in 2 months 2)  Your physician recommends that you return for lab work in: 1 month 3)  Your physician has recommended you make the following change in your medication: Stop taking Metoprolol and Lisinopril, start taking Atenolol 100mg  by mouth once daily and Diovan 320mg  by mouth once daily  4)  A chest x-ray takes a picture of the organs and structures inside the chest, including the heart, lungs, and blood vessels. This test can show several things, including, whether the heart is enlarged; whether fluid is building up in the lungs; and whether pacemaker / defibrillator leads are still in place. Prescriptions: COZAAR 100 MG TABS (LOSARTAN POTASSIUM) Take 1 tablet by mouth once daily  #30 x 3   Entered by:   Larita Fife Via LPN   Authorized by:   Kathlen Brunswick, MD, [facility]   Signed by:   Larita Fife Via LPN on 13/10/6576   Method used:   Electronically to        [facility] [phone][facility] (retail)       [address]       [place], [place]  [address]       Ph: [phone] or [phone]       Fax: [phone]   RxID:   [id number] DIOVAN 320 MG TABS (VALSARTAN) take 1 tablet by  mouth once daily  #30 x 3   Entered by:   Larita Fife Via LPN   Authorized by:   Kathlen Brunswick, MD, [facility]   Signed by:   Larita Fife Via LPN on 09/08/1599   Method used:   Electronically to        [facility] [phone]* (retail)       [address]       [place], [place]  [address]       Ph: [phone] or [phone]       Fax: [phone]   RxID:   [id number] ATENOLOL 100 MG TABS (ATENOLOL) take 1 tablet by mouth once daily  #30 x 3   Entered by:   Larita Fife Via LPN   Authorized by:   Kathlen Brunswick, MD, [facility]   Signed by:   Larita Fife Via LPN on 93/81/8299   Method used:   [facility]  [address] [phone]* (retail)       [address]       [place], [place]  [address]       Ph: [phone] or [phone]       Fax: [phone]   RxID:   [id number]  See phone note from 11-4. Pt. cannot afford Diovan. Per Dr. Dietrich Pates, Pt. is advised to change to Cozaar 100mg  by mouth once daily.  Larita Fife Via LPN  January 15, 2010 11:18 AM   Appended Document: 3week followup after cardio/rm    Clinical Lists Changes  Problems: Added new problem of ANTICOAGULATION (ICD-V58.61) Observations: Added new observation of PAST MED HX: Atrial fibrillation-hospitalized in 09/2009; onset after 08/2009; associated with CHF; EF of 35% by echo and 23% by nuclear      ; poss. apical MI by echo and nuclear Anticoagulation-per Dr. Renard Matter; INR-2.8 on 5 mg once daily in 09/2009 Chronic anxiety Hypertension Hemoccult-Positive stool-2011 Cholelithiasis-2011     (03/02/2010 12:17) Added new observation of PRIMARY MD: Dr.McInnis (03/02/2010 12:17) Added new observation of INR POC: 2.8  (10/03/2009 12:17) Added new observation of INR POC: 4.1  (09/26/2009 12:17) Added new observation of INR POC: 3.5  (09/18/2009 12:17) Added new observation of BP DIASTOLIC: 80 mmHg (09/18/2009 38:75) Added new observation of BP SYSTOLIC: 120 mmHg (09/18/2009 12:17) Added new observation of  NUCST CONC: Pharmacologic Stress. Normal BP No significant stress-induced EKG changes; rhythm was AF Small mild anteroapical and basilar inferolateral defects       with no reversability Mild to moderate LV enlargement; diffuse hypokinesis; EF-.23  (09/14/2009 12:29)        Past History:  Past Medical History: Atrial fibrillation-hospitalized in 09/2009; onset after 08/2009; associated with CHF; EF of 35% by echo and 23% by nuclear      ; poss. apical MI by echo and nuclear Anticoagulation-per Dr. Renard Matter; INR-2.8 on 5 mg once daily in 09/2009 Chronic anxiety Hypertension Hemoccult-Positive stool-2011 Cholelithiasis-2011     Nuclear ETT  Procedure date:  09/14/2009  Findings:      Pharmacologic Stress. Normal BP No significant stress-induced EKG changes; rhythm was AF Small mild anteroapical and basilar inferolateral defects       with no reversability Mild to moderate LV enlargement; diffuse hypokinesis; EF-.23

## 2010-04-12 NOTE — Miscellaneous (Signed)
Summary: CONSULTATION  Clinical Lists Changes NAME:  Wayne Middleton, Wayne Middleton                 ACCOUNT NO.:  [id number]      MEDICAL RECORD NO.:  [id number]          PATIENT TYPE:  INP      LOCATION:  A231                          FACILITY:  [facility]      PHYSICIAN:  R. Roetta Sessions, M.D. DATE OF BIRTH:  21-Oct-1940      DATE OF CONSULTATION:  02/23/2010   DATE OF DISCHARGE:                                    CONSULTATION         GASTROENTEROLOGIST:  Jonathon Bellows, M.D.      REASON FOR CONSULTATION:  Rectal bleeding status post colonoscopy   December 8 with polypectomy.  Patient is on chronic Coumadin secondary   to A-Fib diagnosed in July.      HISTORY OF PRESENT ILLNESS:  [name] is a pleasant 70 year old   patient of Dr.  Karilyn Cota who underwent a colonoscopy 02/15/2010 secondary   to  heme-positive stools;  the results are a few diverticula at the   sigmoid and descending colon;  4 large polyps were snared, the largest   in the sigmoid colon,  average 3 cm covered with fresh blood.  There is   bleeding noted from the 4 polypectomy sites. Clips were placed.  He had   been on Coumadin since July and this was stopped 5 days prior.  He   restarted his Coumadin on Tuesday of this week.  He reports an acute   onset of bright red blood per rectum yesterday evening.  He had mild   abdominal cramping which has since resolved.  He  now reports 2   additional small amounts of  burgundy which was mixed with small amount   of fresh red blood this morning.  This was verified by nursing staff as   well.  He denies any abdominal pain or distention and denies any nausea   or vomiting.  His only complaint is  mild gas.  He wants to eat   something.  He was started on clear liquids but states that he cannot   stand the taste of this.      PAST MEDICAL HISTORY:  Hypertension, anxiety, atrial fibrillation   diagnosed in July, congestive heart failure.      PAST SURGICAL HISTORY:  Back surgery,  hemorrhoidectomy, history of   cardioversion in the past,  recent colonoscopy 02/15/2010 with removal   of polyps.      ALLERGIES:  He has no known drug allergies.      FAMILY HISTORY:  His mother deceased from natural causes at age 32 .   father deceased age 65 and has a history of coronary artery disease.   Siblings with diabetes.  Three brothers deceased which all had lung   cancer.  He is a widower.  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      SOCIAL HISTORY:  He denies use of alcohol, nicotine or any illicit drug   use.Marland Kitchen      MEDICATIONS PRIOR TO ADMISSION:  Losartan, atenolol, amlodipine,   Coumadin 5 mg daily that was started on Tuesday and Valium as well as   hydrocodone as needed for back pain.      REVIEW OF SYSTEMS:  Negative except as in the HPI.      PHYSICAL EXAMINATION:  VITAL SIGNS:  BP 105/71, pulse 64, respirations   20, temperature  98.  He is 97% on room air.   HEENT:  Sclerae without icterus.  No thyromegaly noted.  No   lymphadenopathy.   CARDIAC:  Irregular rhythm.   LUNGS:  Clear to auscultation bilaterally without any rales or rhonchi.   ABDOMEN:  Soft and nontender, nondistended.  No hepatomegaly noted.   Positive bowel sounds.   NEUROLOGICALLY:  Alert and oriented.   SKIN:  Without significant  rash or lesions.  It is warm and dry.      PERTINENT LABS FOR THIS ADMISSION:  White blood cells 12.4, hemoglobin   and hematocrit 12.6 and 37.6, INR 1.36.  CMP is essentially normal.   LFTs are normal.  Repeat hemoglobin and hematocrit this morning at  5   o'clock with 10.4 at 31.8.  as of note, his past H and H in July was14   and 45.  No radiological procedures performed for this admission.      ASSESSMENT AND PLAN:  [name] is a 70 year old male with a history of   A fib on chronic Coumadin who underwent a colonoscopy last week with   removal of polyps.  He has restarted  his Coumadin on Tuesday.  He   presented with an acute lower GI  bleed currently secondary to Coumadin   use.  It appears that he is stable at this time.  Two units have been   ordered and are on hold if necessary.  His INR is stable at 1.36.  We   will recheck his hemoglobin and hematocrit today at 1:00 p.m. and decide   need for any transfusion or continuing to monitor.   1. Will advance him to a full liquid diet.   2. Hold Coumadin for now.      We would like to thank Dr. Renard Matter for this referral.            ______________________________   Gerrit Halls, ANP-BC         ______________________________   R. Roetta Sessions, M.D.            AS/MEDQ  D:  02/23/2010  T:  02/23/2010  Job:  [id number]      Electronically Signed by Gerrit Halls  on 03/01/2010 03:53:04 PM   Electronically Signed by Lorrin Goodell M.D. on 03/03/2010 02:58:06 PM

## 2010-04-12 NOTE — Consult Note (Signed)
Summary: GI-Dr. Sharia Reeve  [facility]   Imported By: Roderic Ovens 03/09/2010 14:31:49  _____________________________________________________________________  External Attachment:    Type:   Image     Comment:   External Document

## 2010-04-12 NOTE — Progress Notes (Signed)
Summary: PROGRESS NOTES DR [facility] 09-04-09  PROGRESS NOTES DR [name] 09-04-09   Imported By: Faythe Ghee 10/20/2009 12:18:41  _____________________________________________________________________  External Attachment:    Type:   Image     Comment:   External Document

## 2010-05-14 LAB — CONVERTED CEMR LAB
BUN: 13 mg/dL (ref 6–23)
Creatinine, Ser: 0.86 mg/dL (ref 0.40–1.50)

## 2010-05-21 LAB — TYPE AND SCREEN
ABO/RH(D): O POS
Antibody Screen: NEGATIVE
Unit division: 0

## 2010-05-21 LAB — HEMOGLOBIN AND HEMATOCRIT, BLOOD
HCT: 31.2 % — ABNORMAL LOW (ref 39.0–52.0)
HCT: 31.8 % — ABNORMAL LOW (ref 39.0–52.0)
Hemoglobin: 10.4 g/dL — ABNORMAL LOW (ref 13.0–17.0)

## 2010-05-21 LAB — DIFFERENTIAL
Basophils Absolute: 0 10*3/uL (ref 0.0–0.1)
Basophils Relative: 0 % (ref 0–1)
Eosinophils Absolute: 0.2 10*3/uL (ref 0.0–0.7)
Neutro Abs: 7.3 10*3/uL (ref 1.7–7.7)
Neutrophils Relative %: 59 % (ref 43–77)

## 2010-05-21 LAB — COMPREHENSIVE METABOLIC PANEL
ALT: 16 U/L (ref 0–53)
Alkaline Phosphatase: 60 U/L (ref 39–117)
BUN: 19 mg/dL (ref 6–23)
CO2: 27 mEq/L (ref 19–32)
Chloride: 105 mEq/L (ref 96–112)
GFR calc non Af Amer: >60 mL/min (ref >60)
Glucose, Bld: 133 mg/dL — ABNORMAL HIGH (ref 70–99)
Potassium: 4.5 mEq/L (ref 3.5–5.1)
Sodium: 139 mEq/L (ref 135–145)
Total Bilirubin: 0.4 mg/dL (ref 0.3–1.2)
Total Protein: 6.3 g/dL (ref 6.0–8.3)

## 2010-05-21 LAB — PROTIME-INR: INR: 1.36 (ref 0.00–1.49)

## 2010-05-21 LAB — CBC
HCT: 37.6 % — ABNORMAL LOW (ref 39.0–52.0)
MCH: 28.3 pg (ref 26.0–34.0)
MCHC: 33.5 g/dL (ref 30.0–36.0)
RDW: 13 % (ref 11.5–15.5)

## 2010-05-21 LAB — CK TOTAL AND CKMB (NOT AT ARMC): Total CK: 70 U/L (ref 7–232)

## 2010-05-21 LAB — APTT: aPTT: 28 seconds (ref 24–37)

## 2010-05-21 LAB — TROPONIN I: Troponin I: 0.01 ng/mL (ref 0.00–0.06)

## 2010-05-21 LAB — PREPARE RBC (CROSSMATCH)

## 2010-05-22 LAB — PROTIME-INR
INR: 1.25 (ref 0.00–1.49)
Prothrombin Time: 15.9 seconds — ABNORMAL HIGH (ref 11.6–15.2)

## 2010-05-24 LAB — COMPREHENSIVE METABOLIC PANEL
ALT: 18 U/L (ref 0–53)
AST: 23 U/L (ref 0–37)
Albumin: 4 g/dL (ref 3.5–5.2)
Alkaline Phosphatase: 45 U/L (ref 39–117)
Calcium: 9.1 mg/dL (ref 8.4–10.5)
GFR calc Af Amer: >60 mL/min (ref >60)
Potassium: 4.4 mEq/L (ref 3.5–5.1)
Sodium: 142 mEq/L (ref 135–145)
Total Protein: 6.3 g/dL (ref 6.0–8.3)

## 2010-05-27 LAB — PROTIME-INR
INR: 1.18 (ref 0.00–1.49)
INR: 1.2 (ref 0.00–1.49)
INR: 1.36 (ref 0.00–1.49)
INR: 1.49 (ref 0.00–1.49)
INR: 1.53 — ABNORMAL HIGH (ref 0.00–1.49)
INR: 2.24 — ABNORMAL HIGH (ref 0.00–1.49)
Prothrombin Time: 14.9 seconds (ref 11.6–15.2)
Prothrombin Time: 15.5 seconds — ABNORMAL HIGH (ref 11.6–15.2)
Prothrombin Time: 16.7 seconds — ABNORMAL HIGH (ref 11.6–15.2)
Prothrombin Time: 18.3 seconds — ABNORMAL HIGH (ref 11.6–15.2)
Prothrombin Time: 18.7 seconds — ABNORMAL HIGH (ref 11.6–15.2)

## 2010-05-27 LAB — DIFFERENTIAL
Basophils Absolute: 0 10*3/uL (ref 0.0–0.1)
Basophils Absolute: 0.1 10*3/uL (ref 0.0–0.1)
Basophils Relative: 0 % (ref 0–1)
Basophils Relative: 1 % (ref 0–1)
Eosinophils Absolute: 0.1 10*3/uL (ref 0.0–0.7)
Eosinophils Absolute: 0.2 10*3/uL (ref 0.0–0.7)
Eosinophils Relative: 2 % (ref 0–5)
Lymphocytes Relative: 22 % (ref 12–46)
Lymphocytes Relative: 29 % (ref 12–46)
Lymphs Abs: 1.6 10*3/uL (ref 0.7–4.0)
Lymphs Abs: 1.6 10*3/uL (ref 0.7–4.0)
Monocytes Absolute: 1.2 10*3/uL — ABNORMAL HIGH (ref 0.1–1.0)
Monocytes Relative: 12 % (ref 3–12)
Monocytes Relative: 13 % — ABNORMAL HIGH (ref 3–12)
Monocytes Relative: 15 % — ABNORMAL HIGH (ref 3–12)
Neutro Abs: 3.7 10*3/uL (ref 1.7–7.7)
Neutro Abs: 4.2 10*3/uL (ref 1.7–7.7)
Neutro Abs: 4.6 10*3/uL (ref 1.7–7.7)
Neutrophils Relative %: 52 % (ref 43–77)
Neutrophils Relative %: 60 % (ref 43–77)
Neutrophils Relative %: 63 % (ref 43–77)

## 2010-05-27 LAB — COMPREHENSIVE METABOLIC PANEL
ALT: 17 U/L (ref 0–53)
BUN: 9 mg/dL (ref 6–23)
CO2: 28 mEq/L (ref 19–32)
Calcium: 9.4 mg/dL (ref 8.4–10.5)
Creatinine, Ser: 1.03 mg/dL (ref 0.4–1.5)
GFR calc non Af Amer: >60 mL/min (ref >60)
Glucose, Bld: 116 mg/dL — ABNORMAL HIGH (ref 70–99)
Sodium: 142 mEq/L (ref 135–145)

## 2010-05-27 LAB — BASIC METABOLIC PANEL
BUN: 10 mg/dL (ref 6–23)
Calcium: 8.7 mg/dL (ref 8.4–10.5)
Calcium: 9.1 mg/dL (ref 8.4–10.5)
Chloride: 106 mEq/L (ref 96–112)
GFR calc Af Amer: >60 mL/min (ref >60)
GFR calc Af Amer: >60 mL/min (ref >60)
GFR calc non Af Amer: >60 mL/min (ref >60)
GFR calc non Af Amer: >60 mL/min (ref >60)
GFR calc non Af Amer: >60 mL/min (ref >60)
Glucose, Bld: 102 mg/dL — ABNORMAL HIGH (ref 70–99)
Glucose, Bld: 132 mg/dL — ABNORMAL HIGH (ref 70–99)
Potassium: 3.7 mEq/L (ref 3.5–5.1)
Sodium: 138 mEq/L (ref 135–145)
Sodium: 139 mEq/L (ref 135–145)
Sodium: 139 mEq/L (ref 135–145)

## 2010-05-27 LAB — CBC
HCT: 44.2 % (ref 39.0–52.0)
HCT: 45 % (ref 39.0–52.0)
Hemoglobin: 13.6 g/dL (ref 13.0–17.0)
Hemoglobin: 14.3 g/dL (ref 13.0–17.0)
Hemoglobin: 14.5 g/dL (ref 13.0–17.0)
Hemoglobin: 15.4 g/dL (ref 13.0–17.0)
MCH: 26.9 pg (ref 26.0–34.0)
MCH: 27.1 pg (ref 26.0–34.0)
MCHC: 31.8 g/dL (ref 30.0–36.0)
MCHC: 32.3 g/dL (ref 30.0–36.0)
MCHC: 32.7 g/dL (ref 30.0–36.0)
MCV: 82.8 fL (ref 78.0–100.0)
RBC: 5 MIL/uL (ref 4.22–5.81)
RDW: 14.6 % (ref 11.5–15.5)
RDW: 14.6 % (ref 11.5–15.5)
WBC: 8.1 10*3/uL (ref 4.0–10.5)

## 2010-05-27 LAB — T4, FREE: Free T4: 1.2 ng/dL (ref 0.80–1.80)

## 2010-05-27 LAB — HEPATIC FUNCTION PANEL
Bilirubin, Direct: 0.2 mg/dL (ref 0.0–0.3)
Indirect Bilirubin: 0.9 mg/dL (ref 0.3–0.9)
Total Bilirubin: 1.1 mg/dL (ref 0.3–1.2)

## 2010-05-27 LAB — CARDIAC PANEL(CRET KIN+CKTOT+MB+TROPI)
CK, MB: 1.2 ng/mL (ref 0.3–4.0)
CK, MB: 1.4 ng/mL (ref 0.3–4.0)
Total CK: 49 U/L (ref 7–232)
Total CK: 50 U/L (ref 7–232)
Troponin I: 0.02 ng/mL (ref 0.00–0.06)
Troponin I: 0.02 ng/mL (ref 0.00–0.06)

## 2010-05-27 LAB — LIPID PANEL
Triglycerides: 128 mg/dL (ref <150)
VLDL: 26 mg/dL (ref 0–40)

## 2010-05-27 LAB — MAGNESIUM: Magnesium: 2.2 mg/dL (ref 1.5–2.5)

## 2010-05-27 LAB — TSH: TSH: 0.675 u[IU]/mL (ref 0.350–4.500)

## 2010-05-27 LAB — POCT CARDIAC MARKERS

## 2010-05-27 LAB — BRAIN NATRIURETIC PEPTIDE: Pro B Natriuretic peptide (BNP): 106 pg/mL — ABNORMAL HIGH (ref 0.0–100.0)

## 2010-08-22 ENCOUNTER — Encounter: Payer: Self-pay | Admitting: Cardiology

## 2010-09-20 ENCOUNTER — Encounter: Payer: Self-pay | Admitting: Cardiology

## 2010-09-26 ENCOUNTER — Ambulatory Visit (INDEPENDENT_AMBULATORY_CARE_PROVIDER_SITE_OTHER): Payer: Medicare Other | Admitting: Cardiology

## 2010-09-26 ENCOUNTER — Encounter: Payer: Self-pay | Admitting: Cardiology

## 2010-09-26 ENCOUNTER — Encounter: Payer: Self-pay | Admitting: *Deleted

## 2010-09-26 ENCOUNTER — Telehealth: Payer: Self-pay | Admitting: Cardiology

## 2010-09-26 DIAGNOSIS — K8020 Calculus of gallbladder without cholecystitis without obstruction: Secondary | ICD-10-CM | POA: Insufficient documentation

## 2010-09-26 DIAGNOSIS — Z7901 Long term (current) use of anticoagulants: Secondary | ICD-10-CM

## 2010-09-26 DIAGNOSIS — I4891 Unspecified atrial fibrillation: Secondary | ICD-10-CM

## 2010-09-26 DIAGNOSIS — I482 Chronic atrial fibrillation, unspecified: Secondary | ICD-10-CM | POA: Insufficient documentation

## 2010-09-26 DIAGNOSIS — I10 Essential (primary) hypertension: Secondary | ICD-10-CM

## 2010-09-26 DIAGNOSIS — R195 Other fecal abnormalities: Secondary | ICD-10-CM | POA: Insufficient documentation

## 2010-09-26 MED ORDER — CHLORTHALIDONE 25 MG PO TABS: 12.5000 mg | ORAL_TABLET | Freq: Every day | ORAL | Status: DC

## 2010-09-26 NOTE — Assessment & Plan Note (Addendum)
Patient monitors blood pressure at home and has a complete list.  Systolics are less than 135 and all diastolics well less than 80.  Despite systolic hypertension when he comes to see me in the office, blood pressure appears to be well-controlled.  He no longer requires furosemide for treatment of congestive heart failure and fluid overload.  That medication will be discontinued and chlorthalidone 12.5 mg q.d. substituted for control of hypertension.  A chemistry profile will be checked in 3 months.

## 2010-09-26 NOTE — Telephone Encounter (Signed)
Please take whole tablet of kcl

## 2010-09-26 NOTE — Assessment & Plan Note (Addendum)
Heart rate in atrial fibrillation is well controlled, and symptoms are nil.  He has a marginal indication for chronic anticoagulation, but is approaching the age where his indictation will be stronger.  As long as he has no further problems with bleeding, I would be inclined to continue treatment with warfarin.  I will reassess this nice gentleman in one year.  Dr. Renard Matter is adjusting warfarin dosage in the interim

## 2010-09-26 NOTE — Progress Notes (Signed)
HPI : Wayne Middleton returns to the office as scheduled for continuing assessment and treatment of atrial fibrillation.  He initially presented with congestive heart failure and left ventricular dysfunction, but repeat echocardiography in 03/2010 following control of heart rate showed a return of his ejection fraction to normal.  He has done extremely well since with no orthopnea, PND, edema, chest discomfort or dyspnea on exertion.  He is active, including walking 3 miles per day.  He did develop a multiunit lower GI bleed following polypectomy despite having held Coumadin and having an INR of 1.3 at the time.  Anemia had resolved as of today repeat CBC in 04/2010, and he did not require blood transfusion.  He has noted no melena or hematochezia.  He denies palpitations, lightheadedness nor syncope.  Current Outpatient Prescriptions on File Prior to Visit  Medication Sig Dispense Refill  . atenolol (TENORMIN) 100 MG tablet Take 50 mg by mouth 2 (two) times daily.        . diazepam (VALIUM) 10 MG tablet Take 10 mg by mouth every 6 (six) hours as needed.        . furosemide (LASIX) 20 MG tablet Take 20 mg by mouth daily.        . potassium chloride SA (K-DUR,KLOR-CON) 20 MEQ tablet Take 20 mEq by mouth daily.        Marland Kitchen warfarin (COUMADIN) 5 MG tablet Take as directed by the [facility]       . zolpidem (AMBIEN) 10 MG tablet Take 10 mg by mouth at bedtime as needed.           No Known Allergies    Past medical history, social history, and family history reviewed and updated.  ROS: See history of present illness.  PHYSICAL EXAM: BP 144/85  Pulse 74  Ht 5\' 9"  (1.753 m)  Wt 87.091 kg (192 lb)  BMI 28.35 kg/m2  SpO2 96%  General-Well developed; no acute distress Body habitus-proportionate weight and height Neck-No JVD; no carotid bruits Lungs-clear lung fields; resonant to percussion Cardiovascular-normal PMI; normal S1 and S2; irregular rhythm Abdomen-normal bowel sounds; soft and non-tender  without masses or organomegaly Musculoskeletal-No deformities, no cyanosis or clubbing Neurologic-Normal cranial nerves; symmetric strength and tone Skin-Warm, no significant lesions; insect puncture wound near the popliteal space of the right leg that appears to be healing normally (patient reported a tick bite). Extremities-distal pulses intact; no edema  EKG: (Rhythm Strip)-atrial fibrillation with controlled ventricular response; ventricular rate of 85 bpm.  ASSESSMENT AND PLAN:

## 2010-09-26 NOTE — Patient Instructions (Signed)
Your physician recommends that you schedule a follow-up appointment in: 1 year Your physician has recommended you make the following change in your medication: stop furosemide and begin chlorthalidone 25mg   Tablets take 1/2 tablet by mouth daily Stool cards- follow directions in packet

## 2010-09-26 NOTE — Telephone Encounter (Signed)
Patient has questions his medication instructions from visit this AM / tg

## 2010-10-02 ENCOUNTER — Other Ambulatory Visit: Payer: Self-pay | Admitting: *Deleted

## 2010-10-02 DIAGNOSIS — I10 Essential (primary) hypertension: Secondary | ICD-10-CM

## 2010-10-02 DIAGNOSIS — I4891 Unspecified atrial fibrillation: Secondary | ICD-10-CM

## 2010-10-15 ENCOUNTER — Ambulatory Visit (INDEPENDENT_AMBULATORY_CARE_PROVIDER_SITE_OTHER): Payer: Medicare Other

## 2010-10-15 DIAGNOSIS — R195 Other fecal abnormalities: Secondary | ICD-10-CM

## 2010-11-14 ENCOUNTER — Telehealth: Payer: Self-pay | Admitting: Cardiology

## 2010-11-14 NOTE — Telephone Encounter (Signed)
Patient would like to discuss future lab work and results of hemoccult cards / tg

## 2010-11-15 ENCOUNTER — Telehealth: Payer: Self-pay | Admitting: Cardiology

## 2010-11-15 ENCOUNTER — Other Ambulatory Visit: Payer: Self-pay

## 2010-11-15 DIAGNOSIS — Z7901 Long term (current) use of anticoagulants: Secondary | ICD-10-CM

## 2010-11-15 NOTE — Telephone Encounter (Signed)
PT WOULD LIKE LAB ORDERS MAILED TO HIM/TMJ

## 2010-12-12 ENCOUNTER — Other Ambulatory Visit: Payer: Self-pay | Admitting: Cardiology

## 2010-12-13 LAB — BASIC METABOLIC PANEL
BUN: 14 mg/dL (ref 6–23)
CO2: 29 mEq/L (ref 19–32)
Calcium: 9.6 mg/dL (ref 8.4–10.5)
Chloride: 101 mEq/L (ref 96–112)
Creat: 0.99 mg/dL (ref 0.50–1.35)

## 2010-12-20 ENCOUNTER — Telehealth: Payer: Self-pay | Admitting: Cardiology

## 2010-12-20 NOTE — Telephone Encounter (Signed)
Patient wants results of labwork mailed to him everything is okay. / tg

## 2011-04-18 IMAGING — CT CT CHEST W/ CM
2 of 4 series · 15 of 36 positions shown, 18 images · IV contrast (Omnipaque 300)
Comparison: None.

CLINICAL DATA: Question lesion in medial right upper lobe

CT CHEST WITH CONTRAST
TECHNIQUE: Multidetector CT imaging of the chest was performed
following the standard protocol during bolus administration of
intravenous contrast.
Contrast: 80 ml Pmnipaque-CVV IV.

[Series 2: chestroutine 5.0 b40f · axial · 0.79mm/px · z∈[-430,-106]mm · 12 of 71 slices shown, 15 images]
[im 3/71  mediastinal]
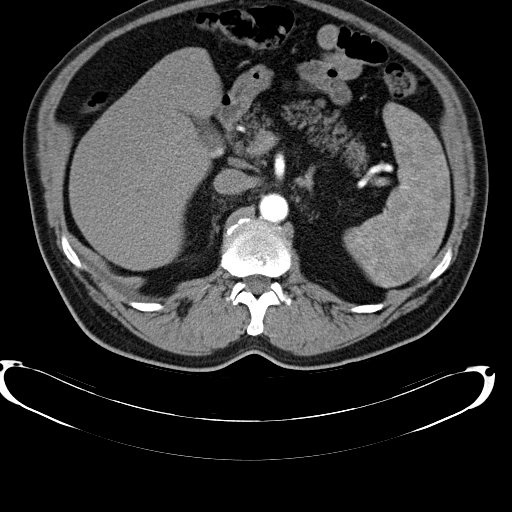
[im 3/71  lung]
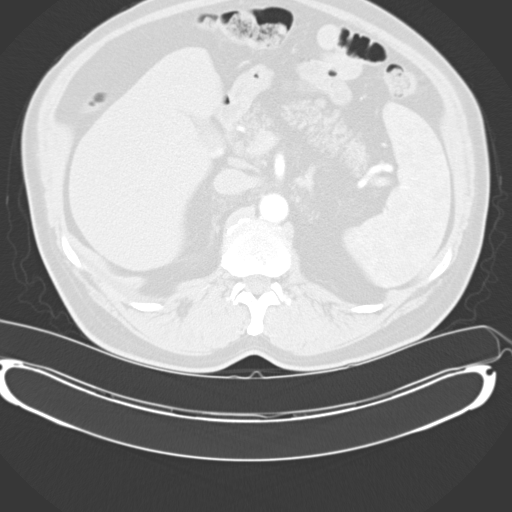
[im 9/71  lung]
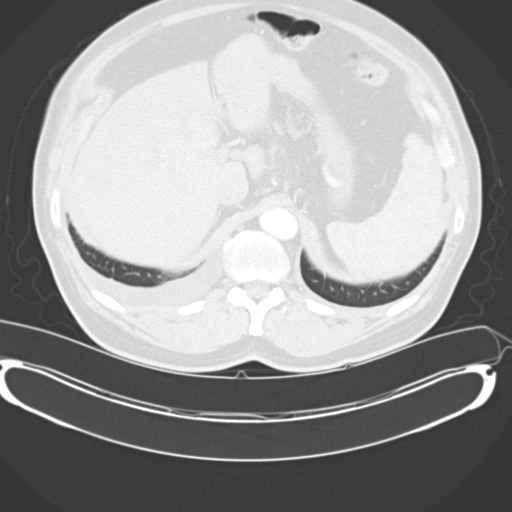
[im 15/71  lung]
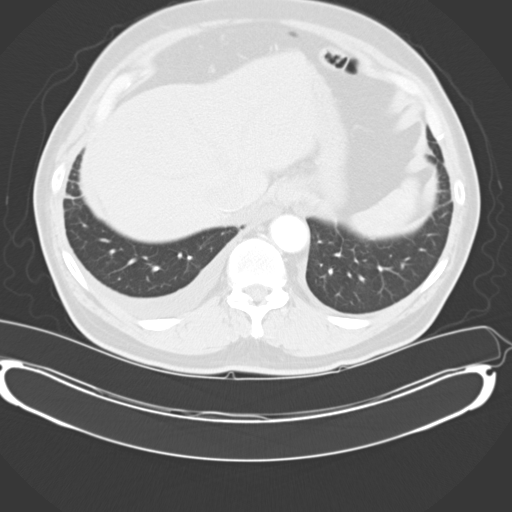
[im 21/71  lung]
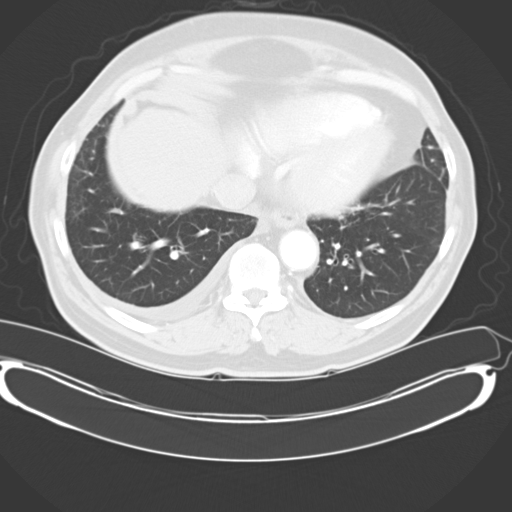
[im 27/71  mediastinal]
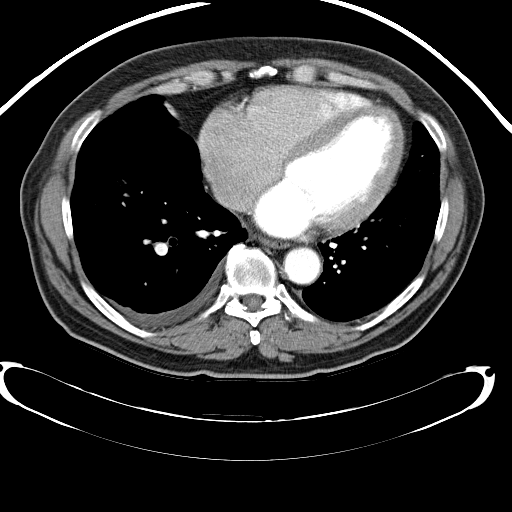
[im 27/71  lung]
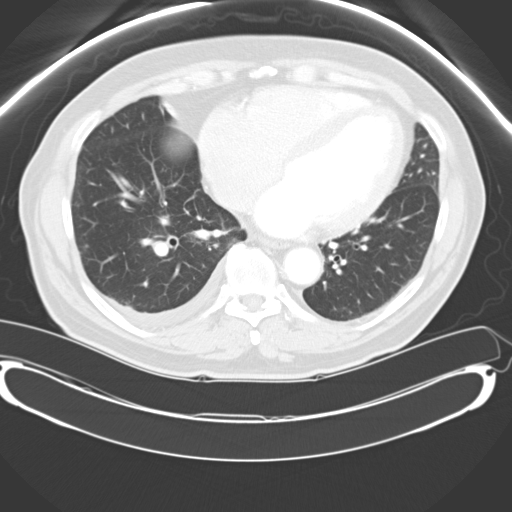
[im 33/71  lung]
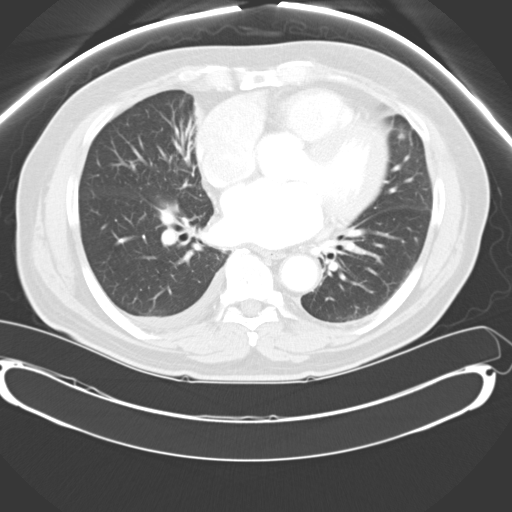
[im 38/71  lung]
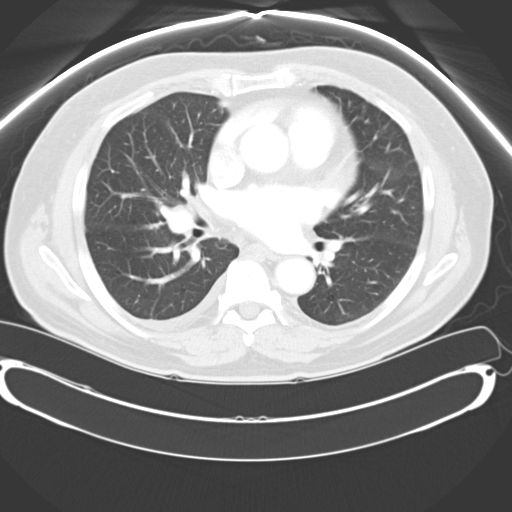
[im 44/71  lung]
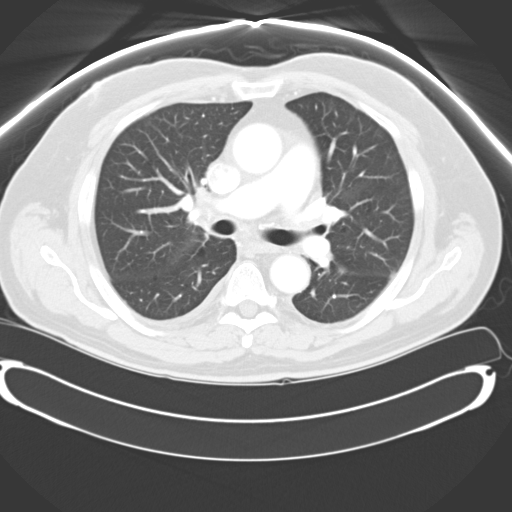
[im 50/71  mediastinal]
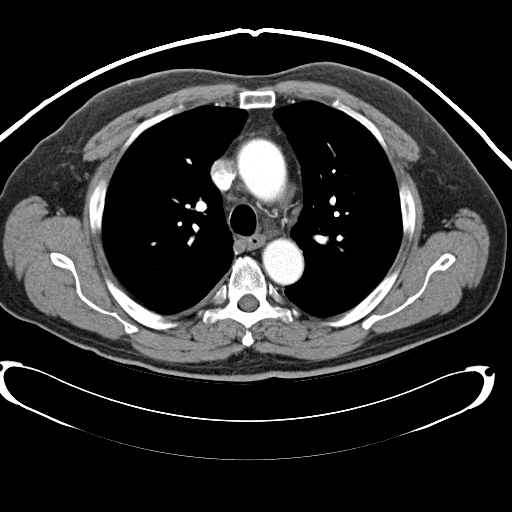
[im 50/71  lung]
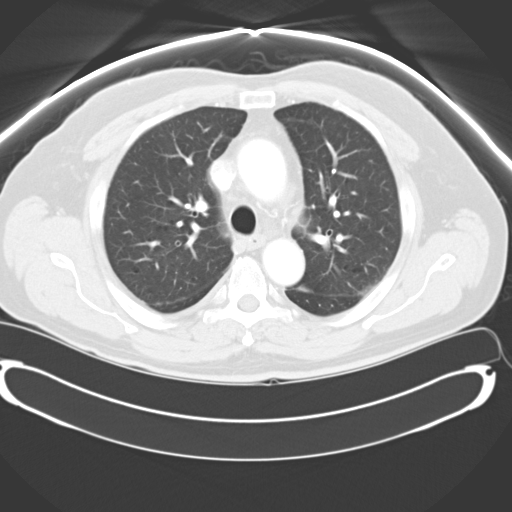
[im 56/71  lung]
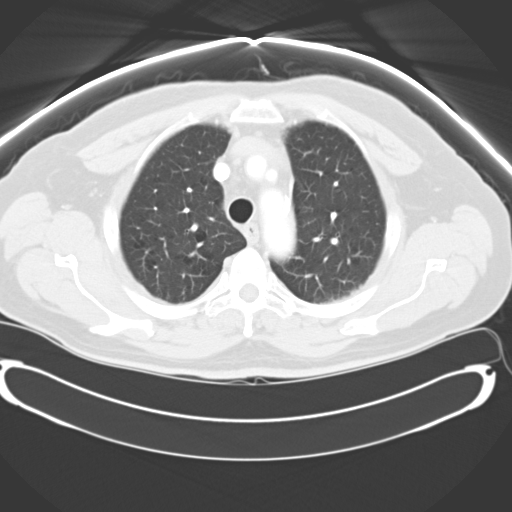
[im 62/71  lung]
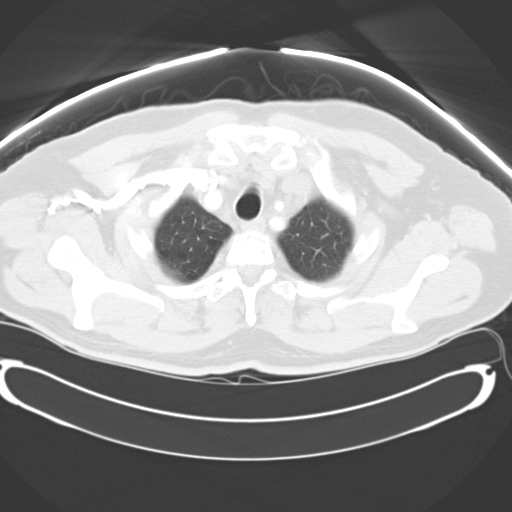
[im 68/71  lung]
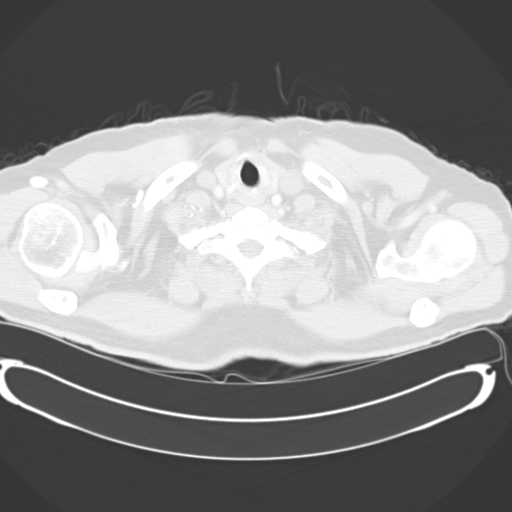

[Series 4: mpr coronal chest 3mm · coronal · 0.69mm/px · 3 of 93 slices shown]
[im 19/93  lung]
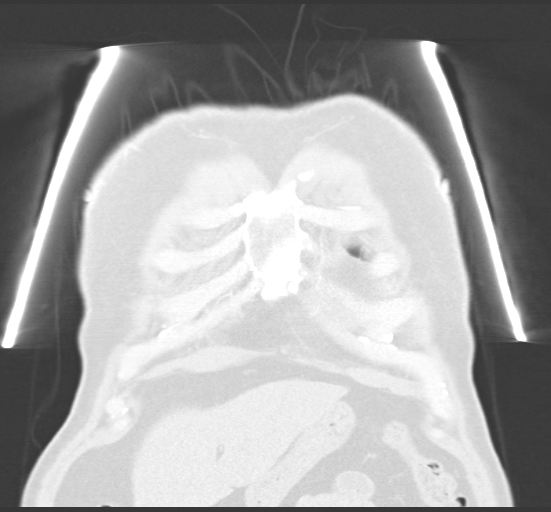
[im 37/93  lung]
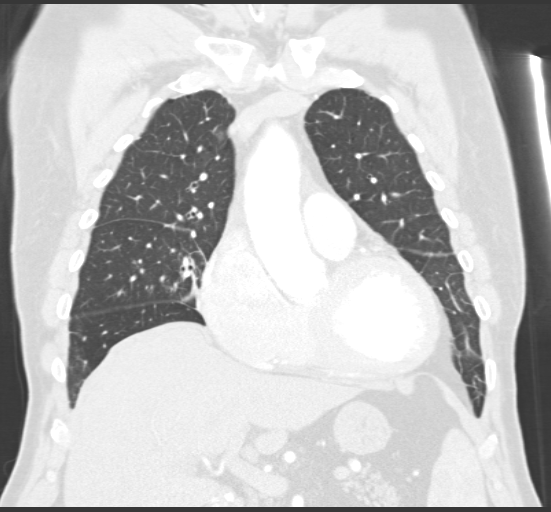
[im 56/93  lung]
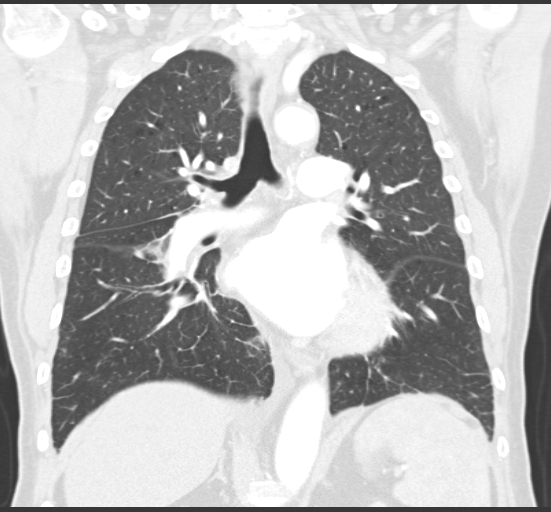

[15 of 36 positions shown; findings below may reference images not displayed]

FINDINGS: There are small pleural effusions.  There is no evidence
of a lesion or mass at the medial right apex.  There are no
pathologically enlarged lymph nodes.  Atelectasis versus scarring
in the medial aspect of the right middle lobe.  Gallstones are
incidentally noted.
IMPRESSION: Specifically, there is no evidence of a lesion in the medial aspect
of the right upper lobe.

## 2011-08-12 ENCOUNTER — Other Ambulatory Visit (HOSPITAL_COMMUNITY): Payer: Self-pay | Admitting: Family Medicine

## 2011-08-12 ENCOUNTER — Ambulatory Visit (HOSPITAL_COMMUNITY)
Admission: RE | Admit: 2011-08-12 | Discharge: 2011-08-12 | Disposition: A | Discharge status: Home / Self Care | Payer: Medicare Other | Source: Ambulatory Visit | Attending: Family Medicine | Admitting: Family Medicine

## 2011-08-12 DIAGNOSIS — R079 Chest pain, unspecified: Secondary | ICD-10-CM

## 2011-09-05 IMAGING — CR DG CHEST 2V
2 series · 2 of 2 positions shown · non-contrast
Comparison: Portable chest x-rays 09/10/2009.  Two-view chest x-ray
08/18/2009.

CLINICAL DATA: Follow up CHF.

CHEST - 2 VIEW 01/11/2010:

[view not recorded (1 of 2)]
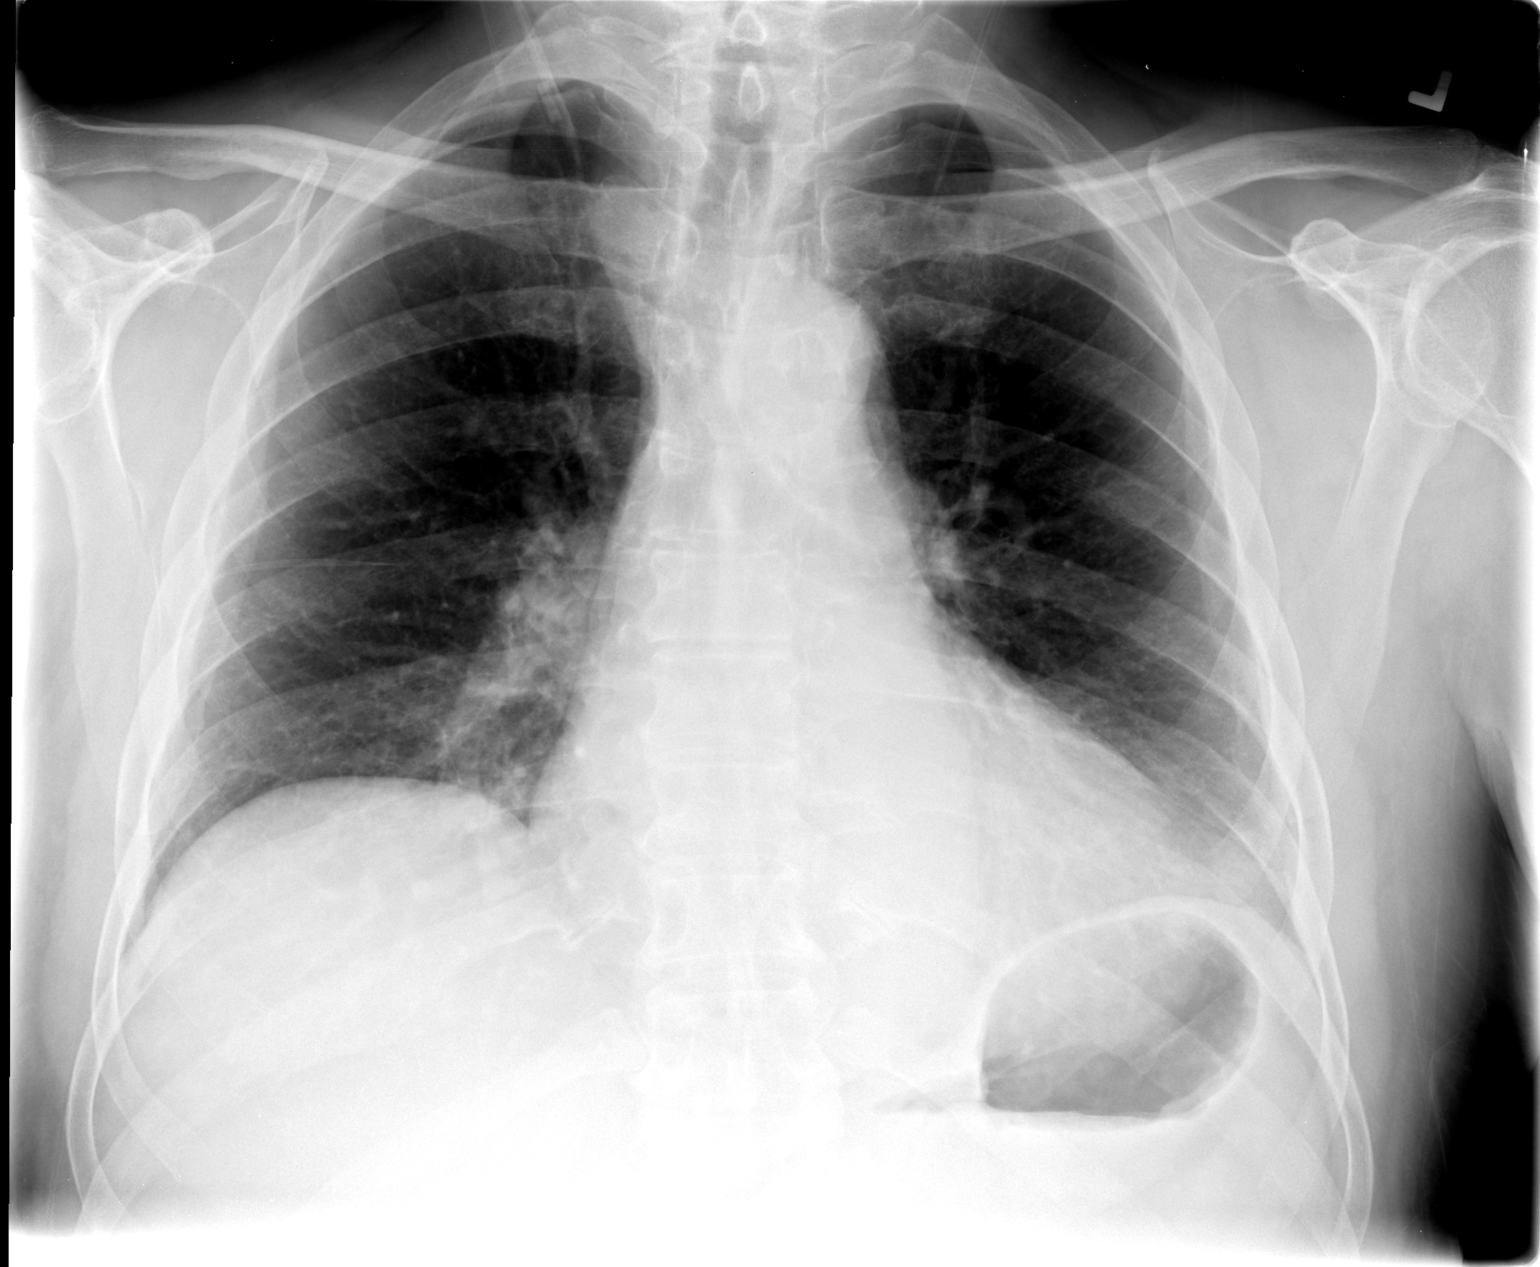

[view not recorded (2 of 2)]
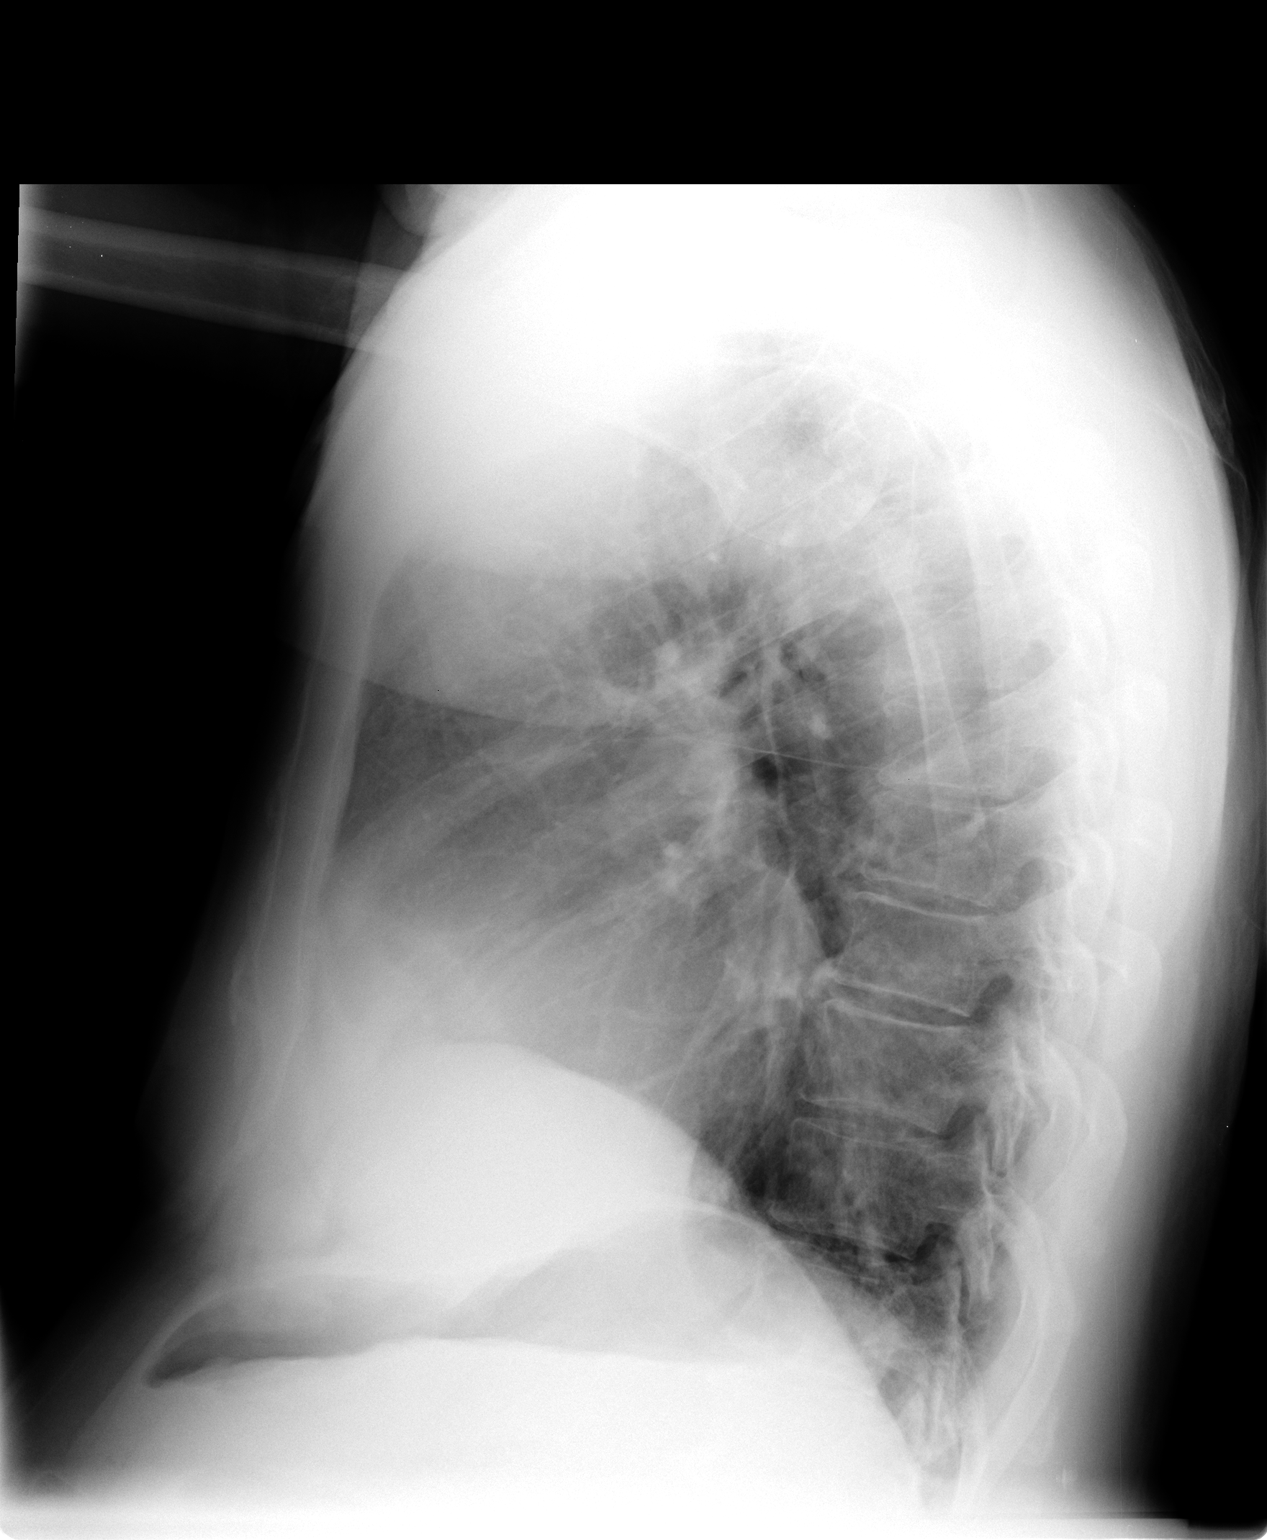

[2 of 2 positions shown; findings below may reference images not displayed]

FINDINGS: Cardiac silhouette moderately enlarged, unchanged.
Pulmonary vascularity normal without evidence of pulmonary edema.
Mild central peribronchial thickening, unchanged.  Lungs otherwise
clear.  No pleural effusions.  Thoracic  aorta mildly tortuous and
atherosclerotic, unchanged.  Hilar and mediastinal contours
otherwise unremarkable.  Visualized bony thorax intact.
IMPRESSION: Stable cardiomegaly and chronic bronchitis and/or asthma.  No acute
cardiopulmonary disease.

## 2011-10-02 ENCOUNTER — Encounter: Payer: Self-pay | Admitting: *Deleted

## 2011-10-02 ENCOUNTER — Ambulatory Visit (INDEPENDENT_AMBULATORY_CARE_PROVIDER_SITE_OTHER): Payer: Medicare Other | Admitting: Cardiology

## 2011-10-02 ENCOUNTER — Encounter: Payer: Self-pay | Admitting: Cardiology

## 2011-10-02 VITALS — BP 138/86 | HR 90 | Ht 74.0 in | Wt 202.0 lb

## 2011-10-02 DIAGNOSIS — I10 Essential (primary) hypertension: Secondary | ICD-10-CM

## 2011-10-02 DIAGNOSIS — I4891 Unspecified atrial fibrillation: Secondary | ICD-10-CM

## 2011-10-02 DIAGNOSIS — Z7901 Long term (current) use of anticoagulants: Secondary | ICD-10-CM

## 2011-10-02 DIAGNOSIS — F411 Generalized anxiety disorder: Secondary | ICD-10-CM

## 2011-10-02 NOTE — Patient Instructions (Addendum)
Your physician recommends that you schedule a follow-up appointment in: 1 year  Your physician has recommended you make the following change in your medication: STOP potassium  Your physician recommends that you return for lab work in: 1 month (you will receive a reminder letter)  Increase potassium in diet

## 2011-10-02 NOTE — Progress Notes (Deleted)
Name: Wayne Middleton    DOB: [birth year]  Age: 71 y.o.  MR#: [id number]       PCP:  Alice Reichert, MD      Insurance: @PAYORNAME @   CC:    Chief Complaint  Patient presents with  . Atrial Fib    No complaints - Med List - TC  . Hypertension    VS BP 138/86  Pulse 90  Ht 6\' 2"  (1.88 m)  Wt 202 lb (91.627 kg)  BMI 25.94 kg/m2  SpO2 98%  Weights Current Weight  10/02/11 202 lb (91.627 kg)  09/26/10 192 lb (87.091 kg)  03/14/10 195 lb (88.451 kg)    Blood Pressure  BP Readings from Last 3 Encounters:  10/02/11 138/86  09/26/10 144/85  03/14/10 162/99     Admit date:  (Not on file) Last encounter with RMR:  Visit date not found   Allergy No Known Allergies  Current Outpatient Prescriptions  Medication Sig Dispense Refill  . atenolol (TENORMIN) 25 MG tablet Take 25 mg by mouth 2 (two) times daily.      . chlorthalidone (HYGROTON) 25 MG tablet Take 12.5 mg by mouth daily.      . diazepam (VALIUM) 10 MG tablet Take 10 mg by mouth every 6 (six) hours as needed.        Marland Kitchen HYDROcodone-acetaminophen (LORCET) 10-650 MG per tablet       . potassium chloride SA (K-DUR,KLOR-CON) 20 MEQ tablet Take 20 mEq by mouth daily.        Marland Kitchen warfarin (COUMADIN) 5 MG tablet Take as directed by the [facility]       . zolpidem (AMBIEN) 10 MG tablet Take 10 mg by mouth at bedtime as needed.        . chlorthalidone (HYGROTON) 25 MG tablet Take 25 mg by mouth daily.        Discontinued Meds:    Medications Discontinued During This Encounter  Medication Reason  . atenolol (TENORMIN) 100 MG tablet Dose change  . furosemide (LASIX) 20 MG tablet Discontinued by provider  . chlorthalidone (HYGROTON) 25 MG tablet   . losartan (COZAAR) 100 MG tablet Discontinued by provider  . chlorthalidone (HYGROTON) 25 MG tablet     Patient Active Problem List  Diagnosis  . ANXIETY  . Atrial fibrillation  . Chronic anticoagulation  . Hypertension  . Cholelithiasis  . Occult GI bleeding    LABS No  visits with results within 3 Month(s) from this visit. Latest known visit with results is:  Orders Only on 12/12/2010  Component Date Value  . Sodium 12/12/2010 141   . Potassium 12/12/2010 4.0   . Chloride 12/12/2010 101   . CO2 12/12/2010 29   . Glucose, Bld 12/12/2010 122*  . BUN 12/12/2010 14   . Creat 12/12/2010 0.99   . Calcium 12/12/2010 9.6      Results for this Opt Visit:     Results for orders placed in visit on 12/12/10  BASIC METABOLIC PANEL      Component Value Range   Sodium 141  135 - 145 mEq/L   Potassium 4.0  3.5 - 5.3 mEq/L   Chloride 101  96 - 112 mEq/L   CO2 29  19 - 32 mEq/L   Glucose, Bld 122 (*) 70 - 99 mg/dL   BUN 14  6 - 23 mg/dL   Creat 4.09  8.11 - 9.14 mg/dL   Calcium 9.6  8.4 - 78.2 mg/dL  EKG Orders placed in visit on 03/15/10  . CONVERTED CEMR EKG     Prior Assessment and Plan Problem List as of 10/02/2011            Cardiology Problems   Atrial fibrillation   Last Assessment & Plan Note   09/26/2010 Office Visit Addendum 10/08/2010  7:53 AM by Kathlen Brunswick, MD    Heart rate in atrial fibrillation is well controlled, and symptoms are nil.  He has a marginal indication for chronic anticoagulation, but is approaching the age where his indictation will be stronger.  As long as he has no further problems with bleeding, I would be inclined to continue treatment with warfarin.  I will reassess this nice gentleman in one year.  Dr. Renard Matter is adjusting warfarin dosage in the interim    Hypertension   Last Assessment & Plan Note   09/26/2010 Office Visit Addendum 10/08/2010  7:54 AM by Kathlen Brunswick, MD    Patient monitors blood pressure at home and has a complete list.  Systolics are less than 135 and all diastolics well less than 80.  Despite systolic hypertension when he comes to see me in the office, blood pressure appears to be well-controlled.  He no longer requires furosemide for treatment of congestive heart failure and fluid  overload.  That medication will be discontinued and chlorthalidone 12.5 mg q.d. substituted for control of hypertension.  A chemistry profile will be checked in 3 months.       Other   ANXIETY   Chronic anticoagulation   Cholelithiasis   Occult GI bleeding       Imaging: No results found.   FRS Calculation: Score not calculated. Missing: Total Cholesterol

## 2011-10-02 NOTE — Assessment & Plan Note (Addendum)
Blood pressure control is good; current medications will be continued.  Patient wonders whether he can discontinue potassium supplementation.  He will stop for now, and a chemistry profile will be checked in one month.  Information was provided regarding foods rich in potassium.

## 2011-10-02 NOTE — Assessment & Plan Note (Signed)
Patient is asymptomatic with respect to arrhythmia.  Current strategy of heart rate controlled and anticoagulation appears optimal and will be continued.

## 2011-10-02 NOTE — Progress Notes (Signed)
Patient ID: Wayne Middleton, male   DOB: Oct 05, 1940, 71 y.o.   MRN: [id number]  HPI: Scheduled return visit for this very nice gentleman with chronic atrial fibrillation.  Since his last visit, he has done generally well.  Colonoscopy performed one year ago resulted in hospitalization for bleeding following polypectomy.  Other than that, he has not required urgent evaluation of any medical issue, been hospitalized, nor developed new medical problems.  He is fairly active, including working with a chain saw, with good exercise tolerance.  Prior to Admission medications   Medication Sig Start Date End Date Taking? Authorizing Provider  atenolol (TENORMIN) 25 MG tablet Take 25 mg by mouth 2 (two) times daily.   Yes Historical Provider, MD  chlorthalidone (HYGROTON) 25 MG tablet Take 12.5 mg by mouth daily.   Yes Historical Provider, MD  diazepam (VALIUM) 10 MG tablet Take 10 mg by mouth every 6 (six) hours as needed.     Yes Historical Provider, MD  HYDROcodone-acetaminophen (LORCET) 10-650 MG per tablet  09/14/10  Yes Historical Provider, MD  warfarin (COUMADIN) 5 MG tablet Take as directed by the [facility]    Yes Historical Provider, MD  zolpidem (AMBIEN) 10 MG tablet Take 10 mg by mouth at bedtime as needed.     Yes Historical Provider, MD  chlorthalidone (HYGROTON) 25 MG tablet Take 25 mg by mouth daily. 09/26/10 10/02/11  Kathlen Brunswick, MD  No Known Allergies    Past medical history, social history, and family history reviewed and updated.  ROS: Denies orthopnea, PND, dyspnea, chest discomfort, palpitations, lightheadedness or syncope.  PHYSICAL EXAM: BP 138/86  Pulse 90  Ht 6\' 2"  (1.88 m)  Wt 91.627 kg (202 lb)  BMI 25.94 kg/m2  SpO2 98%  General-Well developed; no acute distress Body habitus-proportionate weight and height Neck-No JVD; no carotid bruits Lungs-clear lung fields; resonant to percussion Cardiovascular-normal PMI; normal S1 and S2 Abdomen-normal bowel sounds; soft and  non-tender without masses or organomegaly Musculoskeletal-No deformities, no cyanosis or clubbing Neurologic-Normal cranial nerves; symmetric strength and tone Skin-Warm, no significant lesions Extremities-distal pulses intact; no edema  Rhythm Strip: Atrial fibrillation with controlled ventricular response.  ASSESSMENT AND PLAN:  [place] Bing, MD 10/02/2011 12:47 PM

## 2011-10-02 NOTE — Assessment & Plan Note (Signed)
Tolerated anticoagulation well with no spontaneous bleeding and negative testing for occult GI blood loss.

## 2011-10-04 ENCOUNTER — Encounter: Payer: Self-pay | Admitting: *Deleted

## 2011-10-14 ENCOUNTER — Other Ambulatory Visit: Payer: Self-pay | Admitting: Cardiology

## 2011-10-14 ENCOUNTER — Other Ambulatory Visit: Payer: Self-pay | Admitting: *Deleted

## 2011-10-14 DIAGNOSIS — I4891 Unspecified atrial fibrillation: Secondary | ICD-10-CM

## 2011-10-14 LAB — COMPREHENSIVE METABOLIC PANEL
ALT: 18 U/L (ref 0–53)
Albumin: 4.4 g/dL (ref 3.5–5.2)
Alkaline Phosphatase: 44 U/L (ref 39–117)
CO2: 32 mEq/L (ref 19–32)
Glucose, Bld: 142 mg/dL — ABNORMAL HIGH (ref 70–99)
Potassium: 3.9 mEq/L (ref 3.5–5.3)
Sodium: 142 mEq/L (ref 135–145)
Total Protein: 6.3 g/dL (ref 6.0–8.3)

## 2011-10-14 LAB — CBC
Hemoglobin: 16.3 g/dL (ref 13.0–17.0)
Platelets: 160 10*3/uL (ref 150–400)
RBC: 5.54 MIL/uL (ref 4.22–5.81)

## 2012-09-23 ENCOUNTER — Ambulatory Visit (INDEPENDENT_AMBULATORY_CARE_PROVIDER_SITE_OTHER): Payer: Medicare Other | Admitting: Cardiology

## 2012-09-23 ENCOUNTER — Encounter: Payer: Self-pay | Admitting: Cardiology

## 2012-09-23 VITALS — BP 124/80 | HR 82 | Ht 74.0 in | Wt 206.0 lb

## 2012-09-23 DIAGNOSIS — Z7901 Long term (current) use of anticoagulants: Secondary | ICD-10-CM

## 2012-09-23 DIAGNOSIS — I10 Essential (primary) hypertension: Secondary | ICD-10-CM

## 2012-09-23 DIAGNOSIS — I4891 Unspecified atrial fibrillation: Secondary | ICD-10-CM

## 2012-09-23 NOTE — Patient Instructions (Addendum)
Your physician recommends that you schedule a follow-up appointment in: ONE YEAR WITH DR MCDOWELL   .Your Physician recommends that you complete the Hemoccult package given to you today, instructions are enclosed in your packet. Once completed please return to this office.  Your physician recommends that you return for lab work in: TODAY (SLIPS GIVEN FOR CBC, CMET)

## 2012-09-23 NOTE — Progress Notes (Deleted)
Name: Wayne Middleton    DOB: Mar 10, 1941  Age: 72 y.o.  MR#: [id number]       PCP:  Alice Reichert, MD      Insurance: Payor: [employer] MEDICARE / Plan: AARP MEDICARE COMPLETE / Product Type: *No Product type* /   CC:    Chief Complaint  Patient presents with  . Appointment     brougth INR/PT results from PCP.    LIST  VS Filed Vitals:   09/23/12 1349  BP: 124/80  Pulse: 82  Height: 6\' 2"  (1.88 m)  Weight: 206 lb (93.441 kg)    Weights Current Weight  09/23/12 206 lb (93.441 kg)  10/02/11 202 lb (91.627 kg)  09/26/10 192 lb (87.091 kg)    Blood Pressure  BP Readings from Last 3 Encounters:  09/23/12 124/80  10/02/11 138/86  09/26/10 144/85     Admit date:  (Not on file) Last encounter with RMR:  Visit date not found   Allergy Review of patient's allergies indicates no known allergies.  Current Outpatient Prescriptions  Medication Sig Dispense Refill  . atenolol (TENORMIN) 25 MG tablet Take 25 mg by mouth 2 (two) times daily.      . chlorthalidone (HYGROTON) 25 MG tablet Take 12.5 mg by mouth daily.      . diazepam (VALIUM) 10 MG tablet Take 10 mg by mouth every 6 (six) hours as needed.        Marland Kitchen HYDROcodone-acetaminophen (LORCET) 10-650 MG per tablet       . warfarin (COUMADIN) 5 MG tablet Take as directed by the [facility]       . zolpidem (AMBIEN) 10 MG tablet Take 10 mg by mouth at bedtime as needed.         No current facility-administered medications for this visit.    Discontinued Meds:   There are no discontinued medications.  Patient Active Problem List   Diagnosis Date Noted  . Atrial fibrillation   . Chronic anticoagulation   . Hypertension   . Cholelithiasis   . Occult GI bleeding   . ANXIETY 10/19/2009    LABS    Component Value Date/Time   NA 142 10/14/2011 0835   NA 141 12/12/2010 1355   NA 141 05/14/2010 1740   K 3.9 10/14/2011 0835   K 4.0 12/12/2010 1355   K 4.2 05/14/2010 1740   CL 102 10/14/2011 0835   CL 101 12/12/2010 1355   CL 103 05/14/2010 1740   CO2 32 10/14/2011 0835   CO2 29 12/12/2010 1355   CO2 28 05/14/2010 1740   GLUCOSE 142* 10/14/2011 0835   GLUCOSE 122* 12/12/2010 1355   GLUCOSE 126* 05/14/2010 1740   BUN 16 10/14/2011 0835   BUN 14 12/12/2010 1355   BUN 13 05/14/2010 1740   CREATININE 0.96 10/14/2011 0835   CREATININE 0.99 12/12/2010 1355   CREATININE 0.86 05/14/2010 1740   CREATININE 1.15 02/22/2010 2303   CREATININE 0.93 02/12/2010 2006   CALCIUM 9.6 10/14/2011 0835   CALCIUM 9.6 12/12/2010 1355   CALCIUM 9.2 05/14/2010 1740   GFRNONAA >60 02/22/2010 2303   GFRNONAA >60 12/20/2009 0944   GFRNONAA >60 09/15/2009 0432   GFRAA  Value: >60        The eGFR has been calculated using the MDRD equation. This calculation has not been validated in all clinical situations. eGFR's persistently <60 mL/min signify possible Chronic Kidney Disease. 02/22/2010 2303   GFRAA  Value: >60  The eGFR has been calculated using the MDRD equation. This calculation has not been validated in all clinical situations. eGFR's persistently <60 mL/min signify possible Chronic Kidney Disease. 12/20/2009 0944   GFRAA  Value: >60        The eGFR has been calculated using the MDRD equation. This calculation has not been validated in all clinical situations. eGFR's persistently <60 mL/min signify possible Chronic Kidney Disease. 09/15/2009 0432   CMP     Component Value Date/Time   NA 142 10/14/2011 0835   K 3.9 10/14/2011 0835   CL 102 10/14/2011 0835   CO2 32 10/14/2011 0835   GLUCOSE 142* 10/14/2011 0835   BUN 16 10/14/2011 0835   CREATININE 0.96 10/14/2011 0835   CREATININE 0.86 05/14/2010 1740   CALCIUM 9.6 10/14/2011 0835   PROT 6.3 10/14/2011 0835   ALBUMIN 4.4 10/14/2011 0835   AST 25 10/14/2011 0835   ALT 18 10/14/2011 0835   ALKPHOS 44 10/14/2011 0835   BILITOT 0.8 10/14/2011 0835   GFRNONAA >60 02/22/2010 2303   GFRAA  Value: >60        The eGFR has been calculated using the MDRD equation. This calculation has not been validated in all clinical situations.  eGFR's persistently <60 mL/min signify possible Chronic Kidney Disease. 02/22/2010 2303       Component Value Date/Time   WBC 6.4 10/14/2011 0835   WBC 12.4* 02/22/2010 2303   WBC 6.7 02/12/2010 2006   HGB 16.3 10/14/2011 0835   HGB 10.3* 02/23/2010 1405   HGB 10.4 DELTA CHECK NOTED RESULT REPEATED AND VERIFIED* 02/23/2010 0534   HCT 47.4 10/14/2011 0835   HCT 31.2* 02/23/2010 1405   HCT 31.8* 02/23/2010 0534   MCV 85.6 10/14/2011 0835   MCV 84.5 02/22/2010 2303   MCV 86.3 02/12/2010 2006    Lipid Panel     Component Value Date/Time   CHOL 113 09/15/2009   TRIG 128 09/13/2009 0404   HDL 35 09/15/2009   CHOLHDL 3.2 09/13/2009 0404   VLDL 26 09/13/2009 0404   LDLCALC 52 09/15/2009   LDLCALC  Value: 52        Total Cholesterol/HDL:CHD Risk Coronary Heart Disease Risk Table                     Men   Women  1/2 Average Risk   3.4   3.3  Average Risk       5.0   4.4  2 X Average Risk   9.6   7.1  3 X Average Risk  23.4   11.0        Use the calculated Patient Ratio above and the CHD Risk Table to determine the patient's CHD Risk.        ATP III CLASSIFICATION (LDL):  <100     mg/dL   Optimal  454-098  mg/dL   Near or Above                    Optimal  130-159  mg/dL   Borderline  119-147  mg/dL   High  >829     mg/dL   Very High 07/14/2128 8657    ABG No results found for this basename: phart, pco2, pco2art, po2, po2art, hco3, tco2, acidbasedef, o2sat     Lab Results  Component Value Date   TSH 0.675 ***Test methodology is 3rd generation TSH**** 09/10/2009   BNP (last 3 results) No results found for this basename: PROBNP,  in the last 8760 hours Cardiac Panel (last 3 results) No results found for this basename: CKTOTAL, CKMB, TROPONINI, RELINDX,  in the last 72 hours  Iron/TIBC/Ferritin    Component Value Date/Time   IRON 64 01/02/2010 2056   TIBC 404 01/02/2010 2056   FERRITIN 23 01/02/2010 2056     EKG Orders placed in visit on 09/23/12  . EKG 12-LEAD     Prior Assessment and Plan Problem  List as of 09/23/2012   ANXIETY   Atrial fibrillation   Last Assessment & Plan   10/02/2011 Office Visit Written 10/02/2011 12:53 PM by Kathlen Brunswick, MD     Patient is asymptomatic with respect to arrhythmia.  Current strategy of heart rate controlled and anticoagulation appears optimal and will be continued.    Chronic anticoagulation   Last Assessment & Plan   10/02/2011 Office Visit Written 10/02/2011 12:52 PM by Kathlen Brunswick, MD     Tolerated anticoagulation well with no spontaneous bleeding and negative testing for occult GI blood loss.    Hypertension   Last Assessment & Plan   10/02/2011 Office Visit Edited 10/02/2011 12:54 PM by Kathlen Brunswick, MD     Blood pressure control is good; current medications will be continued.  Patient wonders whether he can discontinue potassium supplementation.  He will stop for now, and a chemistry profile will be checked in one month.  Information was provided regarding foods rich in potassium.    Cholelithiasis   Occult GI bleeding       Imaging: No results found.

## 2012-09-25 LAB — COMPREHENSIVE METABOLIC PANEL
BUN: 15 mg/dL (ref 6–23)
CO2: 31 mEq/L (ref 19–32)
Calcium: 9.2 mg/dL (ref 8.4–10.5)
Chloride: 102 mEq/L (ref 96–112)
Creat: 1.03 mg/dL (ref 0.50–1.35)

## 2012-09-25 LAB — CBC
HCT: 53.2 % — ABNORMAL HIGH (ref 39.0–52.0)
MCH: 28.8 pg (ref 26.0–34.0)
MCV: 85 fL (ref 78.0–100.0)
RBC: 6.26 MIL/uL — ABNORMAL HIGH (ref 4.22–5.81)
WBC: 5.8 10*3/uL (ref 4.0–10.5)

## 2012-09-26 ENCOUNTER — Encounter: Payer: Self-pay | Admitting: Cardiology

## 2012-09-28 ENCOUNTER — Encounter: Payer: Self-pay | Admitting: *Deleted

## 2012-10-06 ENCOUNTER — Ambulatory Visit (INDEPENDENT_AMBULATORY_CARE_PROVIDER_SITE_OTHER): Payer: Medicare Other | Admitting: *Deleted

## 2012-10-06 DIAGNOSIS — Z7901 Long term (current) use of anticoagulants: Secondary | ICD-10-CM

## 2012-10-06 LAB — POC HEMOCCULT BLD/STL (HOME/3-CARD/SCREEN)
Card #2 Fecal Occult Blod, POC: NEGATIVE
Fecal Occult Blood, POC: NEGATIVE

## 2012-10-06 NOTE — Progress Notes (Signed)
Patient ID: Wayne Middleton, male   DOB: 02-08-1941, 72 y.o.   MRN: [id number]  HPI: Scheduled return visit for evaluation and treatment of coronary artery disease, atrial fibrillation requiring anticoagulation and multiple cardiovascular risk factors.  Current Outpatient Prescriptions  Medication Sig Dispense Refill  . atenolol (TENORMIN) 25 MG tablet Take 25 mg by mouth 2 (two) times daily.      . chlorthalidone (HYGROTON) 25 MG tablet Take 12.5 mg by mouth daily.      . diazepam (VALIUM) 10 MG tablet Take 10 mg by mouth every 6 (six) hours as needed.        Marland Kitchen HYDROcodone-acetaminophen (LORCET) 10-650 MG per tablet       . warfarin (COUMADIN) 5 MG tablet Take as directed by the [facility]       . zolpidem (AMBIEN) 10 MG tablet Take 10 mg by mouth at bedtime as needed.         No current facility-administered medications for this visit.   No Known Allergies   Past medical history, social history, and family history reviewed and updated.  ROS:  Patient reports a high level of activity including walking 5 miles per day. He denies chest discomfort dyspnea lightheadedness or syncope. Colonoscopy plus  polypectomy performed within the past year. All other systems reviewed and are negative.  PHYSICAL EXAM: BP 124/80  Pulse 82  Ht 6\' 2"  (1.88 m)  Wt 93.441 kg (206 lb)  BMI 26.44 kg/m2;  Body mass index is 26.44 kg/(m^2). General-Well developed; no acute distress Body habitus-proportionate weight and height Neck-No JVD; no carotid bruits Lungs-clear lung fields; resonant to percussion Cardiovascular-normal PMI; normal S1 and S2; irregular rhythm; minimal systolic ejection murmur Abdomen-normal bowel sounds; soft and non-tender without masses or organomegaly Musculoskeletal-No deformities, no cyanosis or clubbing Neurologic-Normal cranial nerves; symmetric strength and tone Skin-Warm, no significant lesions Extremities-distal pulses intact; no edema  EKG: Atrial fibrillation with  controlled ventricular response; right axis deviation; low-voltage in limb leads; delayed R-wave progression-cannot exclude previous septal myocardial infarction; RSR'-possible RVH; no previous tracing for comparison.  [name], MD 10/06/2012  9:09 PM  ASSESSMENT AND PLAN

## 2012-10-07 ENCOUNTER — Encounter: Payer: Self-pay | Admitting: Cardiology

## 2012-10-07 ENCOUNTER — Encounter: Payer: Self-pay | Admitting: *Deleted

## 2012-10-07 NOTE — Assessment & Plan Note (Signed)
Patient has tolerated chronic anticoagulation well. We will continue to monitor for occult GI blood loss.

## 2012-10-07 NOTE — Assessment & Plan Note (Signed)
No symptoms referable to her atrial fibrillation. Strategy of rate control and anticoagulation will be continued.

## 2012-10-07 NOTE — Assessment & Plan Note (Addendum)
Control of hypertension has been good for at least the past 2 years. Current therapy will be continued with monitoring of electrolytes and renal function.

## 2013-11-03 ENCOUNTER — Ambulatory Visit (INDEPENDENT_AMBULATORY_CARE_PROVIDER_SITE_OTHER): Payer: Medicare Other | Admitting: Cardiology

## 2013-11-03 ENCOUNTER — Encounter: Payer: Self-pay | Admitting: Cardiology

## 2013-11-03 VITALS — BP 142/82 | HR 68 | Ht 74.0 in | Wt 209.0 lb

## 2013-11-03 DIAGNOSIS — Z8679 Personal history of other diseases of the circulatory system: Secondary | ICD-10-CM

## 2013-11-03 DIAGNOSIS — I4891 Unspecified atrial fibrillation: Secondary | ICD-10-CM

## 2013-11-03 DIAGNOSIS — I482 Chronic atrial fibrillation, unspecified: Secondary | ICD-10-CM

## 2013-11-03 DIAGNOSIS — I10 Essential (primary) hypertension: Secondary | ICD-10-CM

## 2013-11-03 NOTE — Assessment & Plan Note (Signed)
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 He remains symptomatically stable. No clear indication followup echocardiogram at this time.

## 2013-11-03 NOTE — Patient Instructions (Signed)
Your physician wants you to follow-up in: 1 year with Dr. Diona Browner. You will receive a reminder letter in the mail two months in advance. If you don't receive a letter, please call our office to schedule the follow-up appointment.  Your physician recommends that you return for lab work now for CBC/BMET  Thank you for choosing [facility]!!

## 2013-11-03 NOTE — Assessment & Plan Note (Signed)
Plan to continue atenolol, Coumadin is followed by Dr. Renard Matter. Continue regular exercise plan. Followup CBC and BMET.

## 2013-11-03 NOTE — Assessment & Plan Note (Signed)
Blood pressure is mildly elevated today. No change to current regimen. Continue exercise and keep followup with Dr. Renard Matter.

## 2013-11-03 NOTE — Progress Notes (Signed)
    Clinical Summary Wayne Middleton is a 73 y.o.male former patient of Dr. Dietrich Pates, last seen in July 2014. This is our first meeting in the office. He presents without any specific complaints, states that he walks 5 miles at a time, 3 days a week for exercise. He reports NYHA class II dyspnea, no sense of palpitations or chest pain. Coumadin is followed by Dr. Renard Matter. He reports no obvious bleeding problems. Reports recent INR of "2.8."  ECG today shows rate-controlled atrial fibrillation with rightward axis and nonspecific ST changes.  Echocardiogram from January 2012 showed mild to moderate LVH with LVEF 50-55%, sclerotic aortic valve with mild aortic regurgitation, mild to moderate left atrial enlargement, mild right atrial enlargement.  No Known Allergies  Current Outpatient Prescriptions  Medication Sig Dispense Refill  . atenolol (TENORMIN) 25 MG tablet Take 25 mg by mouth 2 (two) times daily.      . chlorthalidone (HYGROTON) 25 MG tablet Take 12.5 mg by mouth daily.      . diazepam (VALIUM) 10 MG tablet Take 10 mg by mouth daily. Patient states he takes 1-2 daily.      Marland Kitchen HYDROcodone-acetaminophen (LORCET) 10-650 MG per tablet Take 1 tablet by mouth every 4 (four) hours as needed.       . warfarin (COUMADIN) 4 MG tablet Take 4 mg by mouth as directed.      . zolpidem (AMBIEN) 10 MG tablet Take 10 mg by mouth at bedtime as needed.         No current facility-administered medications for this visit.    Past Medical History  Diagnosis Date  . Atrial fibrillation   . Chronic anticoagulation     Dr. Renard Matter  . Essential hypertension, benign   . Chronic anxiety   . Cholelithiasis   . History of GI bleed 2011    Hospitalized for lower GI bleed following polypectomy  . 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 Ristine reports that he has never smoked. He has never used smokeless tobacco. Mr. Raynor reports that he does not  drink alcohol.  Review of Systems Other systems reviewed and negative except as outlined.  Physical Examination Filed Vitals:   11/03/13 1327  BP: 142/82  Pulse: 68   Filed Weights   11/03/13 1327  Weight: 209 lb (94.802 kg)   Patient appears comfortable at rest. HEENT: Conjunctiva and lids normal, oropharynx clear. Neck: Supple, no elevated JVP or carotid bruits, no thyromegaly. Lungs: Clear to auscultation, nonlabored breathing at rest. Cardiac: Irregularly irregular, no S3 or significant systolic murmur, no pericardial rub. Abdomen: Soft, nontender, bowel sounds present. Extremities: No pitting edema, distal pulses 2+. Skin: Warm and dry. Musculoskeletal: No kyphosis. Neuropsychiatric: Alert and oriented x3, affect grossly appropriate.   Problem List and Plan   Chronic atrial fibrillation Plan to continue atenolol, Coumadin is followed by Dr. Renard Matter. Continue regular exercise plan. Followup CBC and BMET.  Essential hypertension, benign Blood pressure is mildly elevated today. No change to current regimen. Continue exercise and keep followup with Dr. Renard Matter.  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 He remains symptomatically stable. No clear indication followup echocardiogram at this time.    Wayne Middleton, M.D., F.A.C.C.

## 2013-11-04 ENCOUNTER — Telehealth: Payer: Self-pay | Admitting: *Deleted

## 2013-11-04 LAB — CBC WITH DIFFERENTIAL/PLATELET
BASOS PCT: 1 % (ref 0–1)
Basophils Absolute: 0.1 10*3/uL (ref 0.0–0.1)
EOS ABS: 0.2 10*3/uL (ref 0.0–0.7)
Eosinophils Relative: 4 % (ref 0–5)
HEMATOCRIT: 48 % (ref 39.0–52.0)
HEMOGLOBIN: 16.5 g/dL (ref 13.0–17.0)
LYMPHS ABS: 2.1 10*3/uL (ref 0.7–4.0)
Lymphocytes Relative: 34 % (ref 12–46)
MCH: 28.4 pg (ref 26.0–34.0)
MCHC: 34.4 g/dL (ref 30.0–36.0)
MCV: 82.8 fL (ref 78.0–100.0)
MONO ABS: 0.7 10*3/uL (ref 0.1–1.0)
MONOS PCT: 11 % (ref 3–12)
Neutro Abs: 3.1 10*3/uL (ref 1.7–7.7)
Neutrophils Relative %: 50 % (ref 43–77)
Platelets: 153 10*3/uL (ref 150–400)
RBC: 5.8 MIL/uL (ref 4.22–5.81)
RDW: 14.2 % (ref 11.5–15.5)
WBC: 6.1 10*3/uL (ref 4.0–10.5)

## 2013-11-04 LAB — BASIC METABOLIC PANEL
BUN: 14 mg/dL (ref 6–23)
CHLORIDE: 99 meq/L (ref 96–112)
CO2: 33 meq/L — AB (ref 19–32)
Calcium: 9.4 mg/dL (ref 8.4–10.5)
Creat: 1.07 mg/dL (ref 0.50–1.35)
Glucose, Bld: 122 mg/dL — ABNORMAL HIGH (ref 70–99)
POTASSIUM: 3.4 meq/L — AB (ref 3.5–5.3)
SODIUM: 139 meq/L (ref 135–145)

## 2013-11-04 NOTE — Telephone Encounter (Signed)
Notified pt of results forward to Dr. Renard Matter and pt wanted a copy mailed.

## 2013-11-04 NOTE — Telephone Encounter (Signed)
Message copied by Vernon Prey on Thu Nov 04, 2013  3:16 PM ------      Message from: MCDOWELL, Illene Bolus      Created: Thu Nov 04, 2013  3:00 PM       Reviewed. Creatinine normal at 1.0, hemoglobin normal at 16.5, platelets normal at 153. Please forward copy of labs to Dr. Renard Matter. ------

## 2014-10-18 ENCOUNTER — Encounter: Payer: Self-pay | Admitting: Cardiology

## 2014-11-15 ENCOUNTER — Ambulatory Visit: Payer: Self-pay | Admitting: Cardiology

## 2014-11-17 ENCOUNTER — Ambulatory Visit (INDEPENDENT_AMBULATORY_CARE_PROVIDER_SITE_OTHER): Payer: Medicare Other | Admitting: Cardiology

## 2014-11-17 ENCOUNTER — Encounter: Payer: Self-pay | Admitting: Cardiology

## 2014-11-17 VITALS — BP 118/72 | HR 85 | Ht 72.0 in | Wt 203.2 lb

## 2014-11-17 DIAGNOSIS — I482 Chronic atrial fibrillation, unspecified: Secondary | ICD-10-CM

## 2014-11-17 DIAGNOSIS — I429 Cardiomyopathy, unspecified: Secondary | ICD-10-CM | POA: Diagnosis not present

## 2014-11-17 DIAGNOSIS — I10 Essential (primary) hypertension: Secondary | ICD-10-CM | POA: Diagnosis not present

## 2014-11-17 NOTE — Patient Instructions (Signed)
Your physician wants you to follow-up in: 1 year Dr McDowell You will receive a reminder letter in the mail two months in advance. If you don't receive a letter, please call our office to schedule the follow-up appointment.   Your physician recommends that you continue on your current medications as directed. Please refer to the Current Medication list given to you today.    Your physician has requested that you have an echocardiogram. Echocardiography is a painless test that uses sound waves to create images of your heart. It provides your doctor with information about the size and shape of your heart and how well your heart's chambers and valves are working. This procedure takes approximately one hour. There are no restrictions for this procedure.     Thank you for choosing [facility] !        

## 2014-11-17 NOTE — Progress Notes (Signed)
Cardiology Office Note  Date: 11/17/2014   ID: Emmert, Nichter 07-12-1940, MRN [id number]  PCP: Alice Reichert, MD  Primary Cardiologist: Nona Dell, MD   Chief Complaint  Patient presents with  . Atrial Fibrillation  . History of cardiomyopathy    History of Present Illness: Wayne Middleton is a 74 y.o. male last seen in August 2015. He presents for a routine follow-up visit. Other than having some gout in his left foot recently, he has been stable overall. Complains of arthritic back pain and the mornings, but gets up and does his usual walk each day. He walks about 2 miles at a time. He reports NYHA class II dyspnea, no palpitations or chest pain.  We reviewed his medications which are outlined below. He reports no significant changes, continues on atenolol and Coumadin. No spontaneous bleeding problems.  Last echocardiogram was in 2012, we discussed updating evaluation of LVEF. His heart rate today at rest was in the 80s to 90s, may need further up titration of atenolol.   Past Medical History  Diagnosis Date  . Atrial fibrillation   . Chronic anticoagulation     Dr. Renard Matter  . Essential hypertension, benign   . Chronic anxiety   . Cholelithiasis   . History of GI bleed 2011    Hospitalized for lower GI bleed following polypectomy  . 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 Back surgery  1971    post-trauma  . Tumor excision  1960s    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  . Colonoscopy w/ polypectomy  2006, 2011    postprocedure bleed requiring hospital admission    Current Outpatient Prescriptions  Medication Sig Dispense Refill  . allopurinol (ZYLOPRIM) 100 MG tablet Take 100 mg by mouth daily.    Marland Kitchen atenolol (TENORMIN) 25 MG tablet Take 25 mg by mouth 2 (two) times daily.    . chlorthalidone (HYGROTON) 25 MG tablet Take 12.5 mg by mouth daily.    . diazepam  (VALIUM) 10 MG tablet Take 10 mg by mouth daily. Patient states he takes 1-2 daily.    Marland Kitchen warfarin (COUMADIN) 4 MG tablet Take 4 mg by mouth as directed.    . zolpidem (AMBIEN) 10 MG tablet Take 10 mg by mouth at bedtime as needed.       No current facility-administered medications for this visit.    Allergies:  Review of patient's allergies indicates no known allergies.   Social History: The patient  reports that he has never smoked. He has never used smokeless tobacco. He reports that he does not drink alcohol or use illicit drugs.   ROS:  Please see the history of present illness. Otherwise, complete review of systems is positive for none.  All other systems are reviewed and negative.   Physical Exam: VS:  BP 118/72 mmHg  Pulse 85  Ht 6' (1.829 m)  Wt 203 lb 3.2 oz (92.171 kg)  BMI 27.55 kg/m2  SpO2 97%, BMI Body mass index is 27.55 kg/(m^2).  Wt Readings from Last 3 Encounters:  11/17/14 203 lb 3.2 oz (92.171 kg)  11/03/13 209 lb (94.802 kg)  09/23/12 206 lb (93.441 kg)     Patient appears comfortable at rest. HEENT: Conjunctiva and lids normal, oropharynx clear. Neck: Supple, no elevated JVP or carotid bruits, no thyromegaly. Lungs: Clear to auscultation, nonlabored breathing at rest. Cardiac:  Irregularly irregular, no S3 or significant systolic murmur, no pericardial rub. Abdomen: Soft, nontender, bowel sounds present. Extremities: No pitting edema, distal pulses 2+. Skin: Warm and dry. Musculoskeletal: No kyphosis. Neuropsychiatric: Alert and oriented x3, affect grossly appropriate.   ECG: ECG is ordered today and shows atrial fibrillation with poor R-wave progression and nonspecific ST changes.  Other Studies Reviewed Today:  Echocardiogram from January 2012 showed mild to moderate LVH with LVEF 50-55%, sclerotic aortic valve with mild aortic regurgitation, mild to moderate left atrial enlargement, mild right atrial enlargement.  ASSESSMENT AND PLAN:  1. Chronic  atrial fibrillation. Continue atenolol and Coumadin. He has been relatively asymptomatic.  2. History of cardiomyopathy, tachycardia-mediated. LVEF was 50-55% in 2012. We will repeat echocardiogram to reassess LVEF. If there has been any decline, may need up-titrate beta blocker further.  3. Essential hypertension, blood pressure control is good today.  Current medicines were reviewed at length with the patient today.   Orders Placed This Encounter  Procedures  . Echocardiogram    Disposition: FU with me in 1 year.   Signed, Jonelle Sidle, MD, [facility] 11/17/2014 8:43 AM    [facility] at [facility] [address] [place], [place] [address] Phone: [phone]; Fax: [phone]

## 2014-11-21 ENCOUNTER — Ambulatory Visit (HOSPITAL_COMMUNITY)
Admission: RE | Admit: 2014-11-21 | Discharge: 2014-11-21 | Disposition: A | Discharge status: Home / Self Care | Payer: Medicare Other | Source: Ambulatory Visit | Attending: Cardiology | Admitting: Cardiology

## 2014-11-21 DIAGNOSIS — I482 Chronic atrial fibrillation, unspecified: Secondary | ICD-10-CM

## 2014-11-21 DIAGNOSIS — I10 Essential (primary) hypertension: Secondary | ICD-10-CM | POA: Diagnosis not present

## 2014-11-22 ENCOUNTER — Telehealth: Payer: Self-pay

## 2014-11-22 MED ORDER — ATENOLOL 25 MG PO TABS: 25.0000 mg | ORAL_TABLET | Freq: Three times a day (TID) | ORAL | Status: DC

## 2014-11-22 NOTE — Telephone Encounter (Signed)
PT wanted me to send him a copy of results. Sent copy.

## 2015-05-18 ENCOUNTER — Other Ambulatory Visit: Payer: Self-pay | Admitting: Cardiology

## 2016-01-01 NOTE — Progress Notes (Signed)
Cardiology Office Note  Date: 01/02/2016   ID: Wayne Middleton, DOB 05/03/1940, MRN [id number]  PCP: Milana ObeyStephen D Knowlton, MD  Primary Cardiologist: Nona DellSamuel Mana Haberl, MD   Chief Complaint  Patient presents with  . Atrial Fibrillation    History of Present Illness: Wayne Middleton is a 75 y.o. male last seen in September 2016. He presents for a routine follow-up visit. Reports no change in occasional sense of palpitations and stable NYHA class II dyspnea. He has had no exertional chest pain.  He continues on Coumadin with follow-up per PCP. He sees Dr. Sudie BaileyKnowlton about every month. He does not report any bleeding problems. No interval lab work for review, he states that he has not had a physical in the last year.  I reviewed his ECG today which shows rate-controlled atrial fibrillation with poorly progression, nonspecific T-wave changes, and low voltage in the limb leads.  We went over his medications, he takes atenolol 25 mg 3 times a day. He was late taking his dose today. Heart rate was in the 90s.  His last echocardiogram from September 2016 is outlined below.  Past Medical History:  Diagnosis Date  . Atrial fibrillation (HCC)   . 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 Cholelithiasis   . Chronic anticoagulation    Dr. Renard MatterMcInnis  . Chronic anxiety   . Essential hypertension, benign   . History of GI bleed 2011   Hospitalized for lower GI bleed following polypectomy    Past Surgical History:  Procedure Laterality Date  . BACK SURGERY  1971   post-trauma  . COLONOSCOPY W/ POLYPECTOMY  2006, 2011   postprocedure bleed requiring hospital admission  . TUMOR EXCISION  1960s   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    Current Outpatient Prescriptions  Medication Sig Dispense Refill  . allopurinol (ZYLOPRIM) 100 MG tablet Take 100 mg by mouth daily.    Marland Kitchen. atenolol (TENORMIN) 25 MG tablet TAKE 1 TABLET BY MOUTH 3 TIMES DAILY. TAKE 2  TABLETS IN THE MORNING & 1 TABLET IN THE EVENING 90 tablet 3  . chlorthalidone (HYGROTON) 25 MG tablet Take 12.5 mg by mouth daily.    Marland Kitchen. warfarin (COUMADIN) 4 MG tablet Take 4 mg by mouth as directed.    . zolpidem (AMBIEN) 10 MG tablet Take 10 mg by mouth at bedtime as needed.       No current facility-administered medications for this visit.    Allergies:  Review of patient's allergies indicates no known allergies.   Social History: The patient  reports that he has never smoked. He has never used smokeless tobacco. He reports that he does not drink alcohol or use drugs.   ROS:  Please see the history of present illness. Otherwise, complete review of systems is positive for occasional feeling of anxiety.  All other systems are reviewed and negative.   Physical Exam: VS:  BP (!) 150/92   Pulse 100   Ht 6\' 6"  (1.981 m)   Wt 212 lb (96.2 kg)   SpO2 94%   BMI 24.50 kg/m , BMI Body mass index is 24.5 kg/m.  Wt Readings from Last 3 Encounters:  01/02/16 212 lb (96.2 kg)  11/17/14 203 lb 3.2 oz (92.2 kg)  11/03/13 209 lb (94.8 kg)    Patient appears comfortable at rest. HEENT: Conjunctiva and lids normal, oropharynx clear. Neck: Supple, no elevated JVP or carotid bruits, no thyromegaly. Lungs: Clear  to auscultation, nonlabored breathing at rest. Cardiac: Irregularly irregular, no S3 or significant systolic murmur, no pericardial rub. Abdomen: Soft, nontender, bowel sounds present. Extremities: No pitting edema, distal pulses 2+. Skin: Warm and dry. Musculoskeletal: No kyphosis. Neuropsychiatric: Alert and oriented x3, affect grossly appropriate.  ECG: I personally reviewed the tracing from 11/17/2014 which showed atrial fibrillation with decreased R wave progression and nonspecific T-wave changes.  Other Studies Reviewed Today:  Echocardiogram 11/21/2014: Study Conclusions  - Left ventricle: Diffuse hypokinesis worse in the inferior wall   and septum. The cavity size was  moderately dilated. Wall   thickness was normal. The estimated ejection fraction was 40%. - Aortic valve: There was mild regurgitation. - Mitral valve: There was mild regurgitation. - Left atrium: The atrium was mildly dilated. - Right atrium: The atrium was moderately dilated. - Atrial septum: No defect or patent foramen ovale was identified.  Assessment and Plan:  1. Chronic atrial fibrillation. Continue Coumadin with follow-up per Dr. Sudie Bailey. Also on atenolol at 25 mg 3 times a day for now. We will obtain a follow-up CBC and BMET. Beginning heart rate control overall, we may need to go to 50 mg twice daily eventually.  2. History of nonischemic cardiomyopathy, tachycardia-mediated. LVEF was 40% by echocardiogram last year. We will obtain a follow-up study for his next visit.  3. Essential hypertension. He also continues on chlorthalidone. Keep follow-up with Dr. Sudie Bailey.  4. Prior history of lower GI bleed in 2011 following polypectomy. No recurring events.  Current medicines were reviewed with the patient today.   Orders Placed This Encounter  Procedures  . CBC  . Basic Metabolic Panel (BMET)  . EKG 12-Lead  . ECHOCARDIOGRAM COMPLETE    Disposition: Follow-up with me in one year.  Signed, Jonelle Sidle, MD, [facility] 01/02/2016 1:33 PM    [facility] at [facility] [address] [place], [place] [address] Phone: [phone]; Fax: [phone]

## 2016-01-02 ENCOUNTER — Ambulatory Visit (INDEPENDENT_AMBULATORY_CARE_PROVIDER_SITE_OTHER): Payer: Medicare Other | Admitting: Cardiology

## 2016-01-02 ENCOUNTER — Encounter: Payer: Self-pay | Admitting: Cardiology

## 2016-01-02 VITALS — BP 150/92 | HR 100 | Ht 78.0 in | Wt 212.0 lb

## 2016-01-02 DIAGNOSIS — I482 Chronic atrial fibrillation, unspecified: Secondary | ICD-10-CM

## 2016-01-02 DIAGNOSIS — I10 Essential (primary) hypertension: Secondary | ICD-10-CM | POA: Diagnosis not present

## 2016-01-02 DIAGNOSIS — I428 Other cardiomyopathies: Secondary | ICD-10-CM | POA: Diagnosis not present

## 2016-01-02 DIAGNOSIS — Z8719 Personal history of other diseases of the digestive system: Secondary | ICD-10-CM

## 2016-01-02 NOTE — Patient Instructions (Signed)
Your physician wants you to follow-up in: 1 year You will receive a reminder letter in the mail two months in advance. If you don't receive a letter, please call our office to schedule the follow-up appointment.    Get echocardiogram 1 week BEFORE next visit   Get FASTING lab work     Your physician recommends that you continue on your current medications as directed. Please refer to the Current Medication list given to you today.     Thank you for choosing [facility] !

## 2016-01-03 LAB — CBC
HEMATOCRIT: 48.9 % (ref 38.5–50.0)
HEMOGLOBIN: 16.3 g/dL (ref 13.2–17.1)
MCH: 28.6 pg (ref 27.0–33.0)
MCHC: 33.3 g/dL (ref 32.0–36.0)
MCV: 85.8 fL (ref 80.0–100.0)
MPV: 10.5 fL (ref 7.5–12.5)
Platelets: 184 10*3/uL (ref 140–400)
RBC: 5.7 MIL/uL (ref 4.20–5.80)
RDW: 15.6 % — AB (ref 11.0–15.0)
WBC: 6.6 10*3/uL (ref 3.8–10.8)

## 2016-01-03 LAB — BASIC METABOLIC PANEL
BUN: 10 mg/dL (ref 7–25)
CO2: 32 mmol/L — ABNORMAL HIGH (ref 20–31)
CREATININE: 1.04 mg/dL (ref 0.70–1.18)
Calcium: 9.3 mg/dL (ref 8.6–10.3)
Chloride: 101 mmol/L (ref 98–110)
GLUCOSE: 111 mg/dL — AB (ref 65–99)
POTASSIUM: 3.3 mmol/L — AB (ref 3.5–5.3)
Sodium: 142 mmol/L (ref 135–146)

## 2016-01-04 ENCOUNTER — Telehealth: Payer: Self-pay

## 2016-01-04 MED ORDER — POTASSIUM CHLORIDE ER 10 MEQ PO TBCR: 10.0000 meq | EXTENDED_RELEASE_TABLET | Freq: Every day | ORAL | 3 refills | Status: DC

## 2016-01-04 NOTE — Telephone Encounter (Signed)
-----   Message from Jonelle Sidle, MD sent at 01/04/2016  7:53 AM EDT ----- Results reviewed. Renal function normal but potassium was mildly low. He is on chlorthalidone and should probably also be taking a low-dose potassium supplement. If agreeable, would start KCl 10 mEq daily. Otherwise hemoglobin and platelet count are normal. A copy of this test should be forwarded to Milana Obey, MD.

## 2016-04-16 ENCOUNTER — Encounter: Payer: Self-pay | Admitting: Internal Medicine

## 2016-06-13 ENCOUNTER — Telehealth: Payer: Self-pay | Admitting: Cardiology

## 2016-06-13 NOTE — Telephone Encounter (Signed)
Patient states that for one month he has been experiencing a feeling of "needles sticking in the tips of his fingers and toes". States that his systolic BP has been running 90 + for a month as well. / tg

## 2016-06-13 NOTE — Telephone Encounter (Signed)
Patient will see pcp for f/u,states he was told it could be carpal tunnel.We had BP last fall with diastolic of 92

## 2016-08-12 ENCOUNTER — Ambulatory Visit (HOSPITAL_COMMUNITY)
Admission: RE | Admit: 2016-08-12 | Discharge: 2016-08-12 | Disposition: A | Discharge status: Home / Self Care | Payer: Medicare Other | Source: Ambulatory Visit | Attending: Family Medicine | Admitting: Family Medicine

## 2016-08-12 ENCOUNTER — Other Ambulatory Visit (HOSPITAL_COMMUNITY): Payer: Self-pay | Admitting: Family Medicine

## 2016-08-12 DIAGNOSIS — R202 Paresthesia of skin: Secondary | ICD-10-CM | POA: Insufficient documentation

## 2016-08-12 DIAGNOSIS — M47812 Spondylosis without myelopathy or radiculopathy, cervical region: Secondary | ICD-10-CM | POA: Diagnosis not present

## 2016-08-12 DIAGNOSIS — R200 Anesthesia of skin: Secondary | ICD-10-CM

## 2016-12-03 ENCOUNTER — Telehealth: Payer: Self-pay

## 2016-12-03 DIAGNOSIS — I482 Chronic atrial fibrillation, unspecified: Secondary | ICD-10-CM

## 2016-12-03 NOTE — Telephone Encounter (Signed)
New echo order placed

## 2016-12-03 NOTE — Telephone Encounter (Signed)
-----   Message from Vallarie Mare sent at 12/03/2016 11:27 AM EDT ----- The echo order in for pt expires on 01/02/17, he needed to schedule for Nov. Would you please put in a new order?   Thank you! :)

## 2017-01-14 ENCOUNTER — Ambulatory Visit: Payer: Medicare Other | Admitting: Cardiology

## 2017-01-19 NOTE — Progress Notes (Signed)
Cardiology Office Note  Date: 01/20/2017   ID: Delorise Jacksonaul M Corsino, DOB 02/24/1941, MRN [id number]  PCP: Gareth MorganKnowlton, Steve, MD  Primary Cardiologist: Nona DellSamuel Macarius Ruark, MD   Chief Complaint  Patient presents with  . Atrial Fibrillation     History of Present Illness: Wayne Middleton is a 76 y.o. male last seen in October 2017.  He presents today for a routine follow-up visit.  He does not report any palpitations or chest pain, no specific change in stamina since last visit.  He remains on Coumadin and follows with Dr. Sudie BaileyKnowlton.  He does not report any bleeding problems.  We went over his medications.  He continues on atenolol with good heart rate control.  I personally reviewed his ECG today which shows atrial fibrillation with low voltage and rightward axis, poor R wave progression and nonspecific ST-T changes.  Past Medical History:  Diagnosis Date  . Atrial fibrillation (HCC)   . 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 Cholelithiasis   . Chronic anticoagulation   . Chronic anxiety   . Essential hypertension, benign   . History of GI bleed 2011   Hospitalized for lower GI bleed following polypectomy    Past Surgical History:  Procedure Laterality Date  . BACK SURGERY  1971   post-trauma  . COLONOSCOPY W/ POLYPECTOMY  2006, 2011   postprocedure bleed requiring hospital admission  . 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 allopurinol (ZYLOPRIM) 100 MG tablet Take 100 mg by mouth daily.    Marland Kitchen. atenolol (TENORMIN) 100 MG tablet Take 100 mg 2 (two) times daily by mouth.     . chlorthalidone (HYGROTON) 25 MG tablet Take 25 mg daily by mouth.    . potassium chloride (K-DUR) 10 MEQ tablet Take 1 tablet (10 mEq total) by mouth daily. 90 tablet 3  . warfarin (COUMADIN) 4 MG tablet Take 4 mg by mouth as directed.    . zolpidem (AMBIEN) 10 MG tablet Take  10 mg by mouth at bedtime as needed.       No current facility-administered medications for this visit.    Allergies:  Patient has no known allergies.   Social History: The patient  reports that  has never smoked. he has never used smokeless tobacco. He reports that he does not drink alcohol or use drugs.   ROS:  Please see the history of present illness. Otherwise, complete review of systems is positive for tingling in his fingers and feet suggestive of neuropathy.  All other systems are reviewed and negative.   Physical Exam: VS:  BP (!) 142/84   Pulse 99   Ht 6\' 1"  (1.854 m)   Wt 219 lb (99.3 kg)   SpO2 97%   BMI 28.89 kg/m , BMI Body mass index is 28.89 kg/m.  Wt Readings from Last 3 Encounters:  01/20/17 219 lb (99.3 kg)  01/02/16 212 lb (96.2 kg)  11/17/14 203 lb 3.2 oz (92.2 kg)    General: Elderly male, appears comfortable at rest. HEENT: Conjunctiva and lids normal, oropharynx clear. Neck: Supple, no elevated JVP or carotid bruits, no thyromegaly. Lungs: Clear to auscultation, nonlabored breathing at rest. Cardiac: Irregularly irregular, no S3 or significant systolic murmur, no pericardial rub. Abdomen: Soft, nontender, bowel sounds present. Extremities: No pitting edema, distal pulses 2+. Skin: Warm and dry. Musculoskeletal: No  kyphosis. Neuropsychiatric: Alert and oriented x3, affect grossly appropriate.  ECG: I personally reviewed the tracing from 01/02/2016 which showed rate-controlled atrial fibrillation with poor R wave progression and nonspecific ST-T changes.  Recent Labwork:  October 2017: Potassium 3.3, BUN 10, creatinine 1.0, hemoglobin 16.3, platelets 194  Other Studies Reviewed Today:  Echocardiogram 11/21/2014: Study Conclusions  - Left ventricle: Diffuse hypokinesis worse in the inferior wall   and septum. The cavity size was moderately dilated. Wall   thickness was normal. The estimated ejection fraction was 40%. - Aortic valve: There was  mild regurgitation. - Mitral valve: There was mild regurgitation. - Left atrium: The atrium was mildly dilated. - Right atrium: The atrium was moderately dilated. - Atrial septum: No defect or patent foramen ovale was identified.  Assessment and Plan:  1.  Chronic atrial fibrillation, heart rate adequately controlled on current dose of atenolol and continues on Coumadin with follow-up per Dr. Sudie Bailey.  ECG reviewed.  2.  History of tachycardia mediated cardiomyopathy.  Follow-up echocardiogram ordered and pending.  3.  Essential hypertension.  Continues on chlorthalidone with follow-up per Dr. Sudie Bailey.  4.  Previous history of lower GI bleed following polypectomy in 2011.  He denies any stool changes, no obvious recurrent bleeding.  Current medicines were reviewed with the patient today.   Orders Placed This Encounter  Procedures  . EKG 12-Lead    Disposition: Follow-up in 1 year, sooner if needed.  Signed, Jonelle Sidle, MD, [facility] 01/20/2017 2:39 PM    [facility] at [facility] [address] [place], [place] [address] Phone: [phone]; Fax: [phone]

## 2017-01-20 ENCOUNTER — Ambulatory Visit (INDEPENDENT_AMBULATORY_CARE_PROVIDER_SITE_OTHER): Payer: Medicare Other | Admitting: Cardiology

## 2017-01-20 ENCOUNTER — Encounter: Payer: Self-pay | Admitting: Cardiology

## 2017-01-20 ENCOUNTER — Ambulatory Visit (HOSPITAL_COMMUNITY)
Admission: RE | Admit: 2017-01-20 | Discharge: 2017-01-20 | Disposition: A | Discharge status: Home / Self Care | Payer: Medicare Other | Source: Ambulatory Visit | Attending: Cardiology | Admitting: Cardiology

## 2017-01-20 VITALS — BP 142/84 | HR 99 | Ht 73.0 in | Wt 219.0 lb

## 2017-01-20 DIAGNOSIS — I428 Other cardiomyopathies: Secondary | ICD-10-CM

## 2017-01-20 DIAGNOSIS — I482 Chronic atrial fibrillation, unspecified: Secondary | ICD-10-CM

## 2017-01-20 DIAGNOSIS — I429 Cardiomyopathy, unspecified: Secondary | ICD-10-CM | POA: Insufficient documentation

## 2017-01-20 DIAGNOSIS — Z7901 Long term (current) use of anticoagulants: Secondary | ICD-10-CM | POA: Diagnosis not present

## 2017-01-20 DIAGNOSIS — I10 Essential (primary) hypertension: Secondary | ICD-10-CM | POA: Diagnosis not present

## 2017-01-20 DIAGNOSIS — I083 Combined rheumatic disorders of mitral, aortic and tricuspid valves: Secondary | ICD-10-CM | POA: Insufficient documentation

## 2017-01-20 DIAGNOSIS — Z8719 Personal history of other diseases of the digestive system: Secondary | ICD-10-CM

## 2017-01-20 MED ORDER — CHLORTHALIDONE 25 MG PO TABS: 25.0000 mg | ORAL_TABLET | Freq: Every day | ORAL | 3 refills | Status: DC

## 2017-01-20 NOTE — Patient Instructions (Signed)
Your physician wants you to follow-up in: 1 year with Dr.McDowell You will receive a reminder letter in the mail two months in advance. If you don't receive a letter, please call our office to schedule the follow-up appointment.    Your physician recommends that you continue on your current medications as directed. Please refer to the Current Medication list given to you today.    If you need a refill on your cardiac medications before your next appointment, please call your pharmacy.     No lab work or tests ordered today.      Thank you for choosing [facility] !        

## 2017-01-20 NOTE — Progress Notes (Signed)
*  PRELIMINARY RESULTS* Echocardiogram 2D Echocardiogram has been performed.  Wayne Middleton 01/20/2017, 1:56 PM

## 2017-01-20 NOTE — Addendum Note (Signed)
Addended by: Marlyn Corporal A on: 01/20/2017 02:46 PM   Modules accepted: Orders

## 2017-01-21 ENCOUNTER — Other Ambulatory Visit: Payer: Self-pay | Admitting: Cardiology

## 2018-03-20 ENCOUNTER — Other Ambulatory Visit: Payer: Self-pay | Admitting: Cardiology

## 2018-03-26 ENCOUNTER — Ambulatory Visit: Payer: Medicare HMO | Admitting: Cardiology

## 2018-03-26 ENCOUNTER — Encounter: Payer: Self-pay | Admitting: Cardiology

## 2018-03-26 VITALS — BP 130/84 | HR 101 | Ht 74.0 in | Wt 203.0 lb

## 2018-03-26 DIAGNOSIS — I428 Other cardiomyopathies: Secondary | ICD-10-CM | POA: Diagnosis not present

## 2018-03-26 DIAGNOSIS — I10 Essential (primary) hypertension: Secondary | ICD-10-CM | POA: Diagnosis not present

## 2018-03-26 DIAGNOSIS — I4821 Permanent atrial fibrillation: Secondary | ICD-10-CM

## 2018-03-26 MED ORDER — ATENOLOL 25 MG PO TABS: 25.0000 mg | ORAL_TABLET | Freq: Every day | ORAL | 3 refills | Status: DC

## 2018-03-26 MED ORDER — ATENOLOL 100 MG PO TABS: 100.0000 mg | ORAL_TABLET | Freq: Every day | ORAL | 3 refills | Status: DC

## 2018-03-26 NOTE — Progress Notes (Signed)
Cardiology Office Note  Date: 03/26/2018   ID: Gamble, Kniceley Apr 18, 1940, MRN [id number]  PCP: Gareth Morgan, MD  Primary Cardiologist: Nona Dell, MD   Chief Complaint  Patient presents with  . Atrial Fibrillation    History of Present Illness: Wayne Middleton is a 78 y.o. male last seen in November 2018.  He presents for a follow-up visit.  He does not report any sense of palpitations or progressive shortness of breath.  He tells me that he checks his blood pressure and heart rate regularly and has had systolics in the 100-115 range, but it does not sound like his heart rate has been optimally controlled.  On his own he cut atenolol back to 50 mg once a day.  He has continued on chlorthalidone with potassium supplements.  He is on Coumadin with follow-up per Dr. Sudie Bailey.  Does not report any bleeding problems.  I personally reviewed his ECG today which shows atrial fibrillation at 104 bpm with poor R wave progression and nonspecific ST changes.  Past Medical History:  Diagnosis Date  . Atrial fibrillation (HCC)   . 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 Cholelithiasis   . Chronic anticoagulation   . Chronic anxiety   . Essential hypertension, benign   . History of GI bleed 2011   Hospitalized for lower GI bleed following polypectomy    Past Surgical History:  Procedure Laterality Date  . BACK SURGERY  1971   post-trauma  . COLONOSCOPY W/ POLYPECTOMY  2006, 2011   postprocedure bleed requiring hospital admission  . 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 allopurinol (ZYLOPRIM) 100 MG tablet Take 100 mg by mouth daily.    Marland Kitchen warfarin (COUMADIN) 4 MG tablet Take 4 mg by mouth as directed.    . zolpidem (AMBIEN) 10 MG tablet Take 10 mg by mouth at bedtime as needed.      Marland Kitchen atenolol (TENORMIN) 100 MG tablet Take 1 tablet (100 mg  total) by mouth daily. 90 tablet 3   No current facility-administered medications for this visit.    Allergies:  Patient has no known allergies.   Social History: The patient  reports that he has never smoked. He has never used smokeless tobacco. He reports that he does not drink alcohol or use drugs.   ROS:  Please see the history of present illness. Otherwise, complete review of systems is positive for none.  All other systems are reviewed and negative.   Physical Exam: VS:  BP 130/84 (BP Location: Left Arm)   Pulse (!) 101   Ht 6\' 2"  (1.88 m)   Wt 203 lb (92.1 kg)   SpO2 98%   BMI 26.06 kg/m , BMI Body mass index is 26.06 kg/m.  Wt Readings from Last 3 Encounters:  03/26/18 203 lb (92.1 kg)  01/20/17 219 lb (99.3 kg)  01/02/16 212 lb (96.2 kg)    General: Elderly male, appears comfortable at rest. HEENT: Conjunctiva and lids normal, oropharynx clear. Neck: Supple, no elevated JVP or carotid bruits, no thyromegaly. Lungs: Clear to auscultation, nonlabored breathing at rest. Cardiac: Irregularly irregular, no S3 or significant systolic murmur. Abdomen: Soft, nontender, bowel sounds present. Extremities: No pitting edema, distal pulses 2+. Skin: Warm and dry. Musculoskeletal: No kyphosis. Neuropsychiatric: Alert and oriented x3, affect grossly appropriate.  ECG: I  personally reviewed the tracing from 01/20/2017 which showed atrial fibrillation with low voltage, poor progression, nonspecific ST-T changes.  Other Studies Reviewed Today:  Echocardiogram 01/20/2017: Study Conclusions  - Left ventricle: The cavity size was normal. Wall thickness was   increased in a pattern of mild LVH. Systolic function was mildly   reduced. The estimated ejection fraction was in the range of 45%   to 50%. Diffuse hypokinesis. The study was not technically   sufficient to allow evaluation of LV diastolic dysfunction due to   atrial fibrillation. - Aortic valve: Sclerosis without  stenosis. There was mild to   moderate eccentric regurgitation. - Mitral valve: There was mild regurgitation. - Right atrium: The atrium was moderately dilated. - Tricuspid valve: There was mild regurgitation. - Pulmonary arteries: PA peak pressure: 40 mm Hg (S).  Assessment and Plan:  1.  Permanent atrial fibrillation.  I recommended that he increase atenolol to 100 mg daily and concurrently stop chlorthalidone and potassium supplements.  He will continue to check blood pressure and heart rate and follow-up with Dr. Sudie Bailey.  Otherwise continue Coumadin for stroke prophylaxis.  2.  History of tachycardia-mediated cardiomyopathy.  LVEF 45 to 50% as of November 2018.  3.  Essential hypertension, stopping chlorthalidone with potassium supplements for now.  Hopefully this will allow further up titration of AV nodal blocker for heart rate control.  Current medicines were reviewed with the patient today.   Orders Placed This Encounter  Procedures  . EKG 12-Lead    Disposition: Follow-up in 1 year, sooner if needed.  Signed, Jonelle Sidle, MD, [facility] 03/26/2018 2:20 PM    [facility] at [facility] [address] [place], [place] [address] Phone: [phone]; Fax: [phone]

## 2018-03-26 NOTE — Patient Instructions (Signed)
Medication Instructions:  Your physician has recommended you make the following change in your medication:  Take Atenolol 100 mg Daily  Stop Taking Potassium  Stop Taking Chlorthalidone  If you need a refill on your cardiac medications before your next appointment, please call your pharmacy.   Lab work: NONE   If you have labs (blood work) drawn today and your tests are completely normal, you will receive your results only by: Marland Kitchen MyChart Message (if you have MyChart) OR . A paper copy in the mail If you have any lab test that is abnormal or we need to change your treatment, we will call you to review the results.  Testing/Procedures: NONE   Follow-Up: At [facility], you and your health needs are our priority.  As part of our continuing mission to provide you with exceptional heart care, we have created designated Provider Care Teams.  These Care Teams include your primary Cardiologist (physician) and Advanced Practice Providers (APPs -  Physician Assistants and Nurse Practitioners) who all work together to provide you with the care you need, when you need it. You will need a follow up appointment in 1 years.  Please call our office 2 months in advance to schedule this appointment.  You may see No primary care provider on file. or one of the following Advanced Practice Providers on your designated Care Team:   [place], PA-C [facility]) . Jacolyn Reedy, PA-C [facility] Office)  Any Other Special Instructions Will Be Listed Below (If Applicable). Thank you for choosing  HeartCare!

## 2018-04-29 ENCOUNTER — Other Ambulatory Visit (HOSPITAL_COMMUNITY): Payer: Self-pay | Admitting: Nurse Practitioner

## 2018-04-29 ENCOUNTER — Ambulatory Visit (HOSPITAL_COMMUNITY)
Admission: RE | Admit: 2018-04-29 | Discharge: 2018-04-29 | Disposition: A | Discharge status: Home / Self Care | Payer: Medicare HMO | Source: Ambulatory Visit | Attending: Nurse Practitioner | Admitting: Nurse Practitioner

## 2018-04-29 DIAGNOSIS — R200 Anesthesia of skin: Secondary | ICD-10-CM | POA: Diagnosis not present

## 2018-04-29 DIAGNOSIS — R202 Paresthesia of skin: Secondary | ICD-10-CM

## 2018-07-06 ENCOUNTER — Ambulatory Visit: Payer: Medicare HMO | Admitting: Neurology

## 2018-09-22 ENCOUNTER — Ambulatory Visit (INDEPENDENT_AMBULATORY_CARE_PROVIDER_SITE_OTHER): Payer: Medicare HMO | Admitting: Neurology

## 2018-09-22 ENCOUNTER — Encounter: Payer: Self-pay | Admitting: Neurology

## 2018-09-22 ENCOUNTER — Other Ambulatory Visit: Payer: Self-pay

## 2018-09-22 VITALS — BP 120/82 | HR 95 | Temp 98.7°F | Ht 74.0 in | Wt 211.1 lb

## 2018-09-22 DIAGNOSIS — R202 Paresthesia of skin: Secondary | ICD-10-CM | POA: Diagnosis not present

## 2018-09-22 MED ORDER — GABAPENTIN 100 MG PO CAPS: 100.0000 mg | ORAL_CAPSULE | Freq: Three times a day (TID) | ORAL | 3 refills | Status: DC

## 2018-09-22 NOTE — Progress Notes (Signed)
Reason for visit: Numbness of all 4 extremities  Referring physician: Dr. Legrand Como SUHAS Middleton is a 78 y.o. male  History of present illness:  Wayne Middleton is a 78 year old right-handed white male with an 50-month history of paresthesias that involve the hands and feet.  The patient believes that the numbness and paresthesias came on together in the arms and legs.  The patient has had some gradual worsening of the numbness over time, he does have some neck discomfort and some documented cervical spondylosis.  He has noted some mild changes in balance but he has not had any falls.  He does not use a cane for ambulation.  He reports no weakness of the extremities, he denies any shock sensations if he moves his head, he apparently has had blood work previously that did not show evidence of a B12 deficiency.  He notes that the discomfort is a bit more severe in the hands, the discomfort is present day and night.  He claims that he does sleep fairly well, however.  He reports no troubles controlling the bowels or the bladder.  He is sent to this office for further evaluation.  The patient indicates that he has had multiple low back surgical procedures.  Past Medical History:  Diagnosis Date  . Atrial fibrillation ([place])   . Cardiomyopathy ([place])    Tachycardia mediated, LVEF improved to 50-55% as of 2012  . Cholelithiasis   . Chronic anticoagulation   . Chronic anxiety   . Diabetes ([place])   . Essential hypertension, benign   . History of GI bleed 2011   Hospitalized for lower GI bleed following polypectomy    Past Surgical History:  Procedure Laterality Date  . BACK SURGERY  1971   post-trauma  . COLONOSCOPY W/ POLYPECTOMY  2006, 2011   postprocedure bleed requiring hospital admission  . 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 Stroke Mother 32  . Heart attack Father 24  . Lung cancer Brother        3 of 5  brothers deceased due to Carcinoma of the lung    Social history:  reports that he has never smoked. He has never used smokeless tobacco. He reports that he does not drink alcohol or use drugs.  Medications:  Prior to Admission medications   Medication Sig Start Date End Date Taking? Authorizing Provider  allopurinol (ZYLOPRIM) 100 MG tablet Take 100 mg by mouth daily.   Yes [provider]  chlorthalidone (HYGROTON) 25 MG tablet  09/17/18  Yes [provider]  diltiazem (TIAZAC) 180 MG 24 hr capsule  09/11/18  Yes [provider]  warfarin (COUMADIN) 4 MG tablet Take 4 mg by mouth as directed.   Yes [provider]  zolpidem (AMBIEN) 10 MG tablet Take 10 mg by mouth at bedtime as needed.     Yes [provider]  atenolol (TENORMIN) 100 MG tablet Take 1 tablet (100 mg total) by mouth daily. 03/26/18 06/24/18  Satira Sark, MD     No Known Allergies  ROS:  Out of a complete 14 system review of symptoms, the patient complains only of the following symptoms, and all other reviewed systems are negative.  Numbness of hands and feet Chronic low back pain  Blood pressure 120/82, pulse 95, temperature 98.7 F (37.1 C), temperature source Temporal, height 6\' 2"  (1.88 m), weight  211 lb 2 oz (95.8 kg), SpO2 97 %.  Physical Exam  General: The patient is alert and cooperative at the time of the examination.  Eyes: Pupils are equal, round, and reactive to light. Discs are flat bilaterally.  Neck: The neck is supple, no carotid bruits are noted.  Respiratory: The respiratory examination is clear.  Cardiovascular: The cardiovascular examination reveals a regular rate and rhythm, no obvious murmurs or rubs are noted.  Skin: Extremities are without significant edema.  Neurologic Exam  Mental status: The patient is alert and oriented x 3 at the time of the examination. The patient has apparent normal recent and remote memory, with an apparently  normal attention span and concentration ability.  Cranial nerves: Facial symmetry is present. There is good sensation of the face to pinprick and soft touch bilaterally. The strength of the facial muscles and the muscles to head turning and shoulder shrug are normal bilaterally. Speech is well enunciated, no aphasia or dysarthria is noted. Extraocular movements are full. Visual fields are full. The tongue is midline, and the patient has symmetric elevation of the soft palate. No obvious hearing deficits are noted.  Motor: The motor testing reveals 5 over 5 strength of all 4 extremities. Good symmetric motor tone is noted throughout.  Sensory: Sensory testing is intact to pinprick, soft touch, vibration sensation, and position sense on the upper extremities.  With the lower extremities, there is a stocking pattern pinprick sensory deficit 1/2 way up the legs bilaterally with significant impairment of vibration and position sense in both feet.  No evidence of extinction is noted.  Coordination: Cerebellar testing reveals good finger-nose-finger and heel-to-shin bilaterally.  Gait and station: Gait is normal. Tandem gait is unsteady. Romberg is negative. No drift is seen.  Reflexes: Deep tendon reflexes are symmetric and normal bilaterally, with exception of depressed ankle jerk reflexes bilaterally. Toes are downgoing bilaterally.   Assessment/Plan:  1.  Paresthesias, all 4 extremities, possible peripheral neuropathy  The patient will be set up for blood work today, he will have nerve conduction studies on both legs and one arm, EMG on one leg.  If a neuropathy is not found by nerve conduction studies, we will pursue further work-up to include MRI of the cervical spine.  The patient will follow-up for the EMG, he will be seen in 4 months otherwise, we will start gabapentin for the discomfort taking 100 mg 3 times daily.  He will call for any dose adjustments.  Marlan Palau MD 09/22/2018 3:03 PM   [facility] [address] [place], [place] [address]  Phone [phone] Fax [phone]

## 2018-09-22 NOTE — Patient Instructions (Signed)
We will start gabapentin 100 mg three times a day for the nerve pain, call for any dose adjustments.

## 2018-09-24 LAB — ANA W/REFLEX: Anti Nuclear Antibody (ANA): NEGATIVE

## 2018-09-24 LAB — MULTIPLE MYELOMA PANEL, SERUM
Albumin SerPl Elph-Mcnc: 4.1 g/dL (ref 2.9–4.4)
Albumin/Glob SerPl: 1.6 (ref 0.7–1.7)
Alpha 1: 0.2 g/dL (ref 0.0–0.4)
Alpha2 Glob SerPl Elph-Mcnc: 0.9 g/dL (ref 0.4–1.0)
B-Globulin SerPl Elph-Mcnc: 1 g/dL (ref 0.7–1.3)
Gamma Glob SerPl Elph-Mcnc: 0.6 g/dL (ref 0.4–1.8)
Globulin, Total: 2.7 g/dL (ref 2.2–3.9)
IgA/Immunoglobulin A, Serum: 255 mg/dL (ref 61–437)
IgG (Immunoglobin G), Serum: 807 mg/dL (ref 603–1613)
IgM (Immunoglobulin M), Srm: 38 mg/dL (ref 15–143)
Total Protein: 6.8 g/dL (ref 6.0–8.5)

## 2018-09-24 LAB — RHEUMATOID FACTOR: Rhuematoid fact SerPl-aCnc: <10 IU/mL (ref 0.0–13.9)

## 2018-09-24 LAB — B. BURGDORFI ANTIBODIES: Lyme IgG/IgM Ab: <0.91 {ISR} (ref 0.00–0.90)

## 2018-09-24 LAB — ANGIOTENSIN CONVERTING ENZYME: Angio Convert Enzyme: 47 U/L (ref 14–82)

## 2018-09-29 ENCOUNTER — Telehealth: Payer: Self-pay

## 2018-09-29 NOTE — Telephone Encounter (Signed)
I called the patient.  The patient is not getting benefit from his paresthesias with the gabapentin dose of 100 mg 3 times daily, he will double the dose to 200 mg 3 times daily, and we may in the future convert him to the 300 mg capsules.  He will contact our office.

## 2018-09-29 NOTE — Telephone Encounter (Signed)
-----   Message from Kathrynn Ducking, MD sent at 09/24/2018  6:12 PM EDT -----  The blood work results are unremarkable. Please call the patient. ----- Message ----- From: Lavone Neri Lab Results In Sent: 09/23/2018   7:38 AM EDT To: Kathrynn Ducking, MD

## 2018-09-29 NOTE — Telephone Encounter (Signed)
I reached out to the pt and advised on labs, he verbalized understanding.  Pt reported gabapentin has not been helping with the paresthesias sx and wanted MD to be aware. Pt wanted to know if any other recommendations could be made?

## 2018-10-15 ENCOUNTER — Telehealth: Payer: Self-pay | Admitting: Neurology

## 2018-10-15 MED ORDER — GABAPENTIN 100 MG PO CAPS: 200.0000 mg | ORAL_CAPSULE | Freq: Three times a day (TID) | ORAL | 3 refills | Status: DC

## 2018-10-15 NOTE — Telephone Encounter (Signed)
Pt's son called and stated that the gabapentin (NEURONTIN) 100 MG capsule was not working for the pt so Dr. Karie Kirks informed the pt to take 2 capsules TID and now the pt has run out of the medication and is needing a refill and for the prescription to be updated to match the new dosage. They would need it sent to [facility] in [place]. Please advise.

## 2018-10-15 NOTE — Telephone Encounter (Signed)
I called son, Marguerite Olea and asked what dose of gabapentin his father is currently taking. I advised him in a call to his father in July, Dr Jannifer Franklin had told patient he could take 200 mg three x a day. The son stated that is his current dose, but when he ran out the pharmacy wouldn't refill. I advised will let Dr Jannifer Franklin know the patient needs a new Rx to reflect higher dose. He  verbalized understanding, appreciation.

## 2018-10-15 NOTE — Telephone Encounter (Signed)
I will send in the prescription for the gabapentin.

## 2018-11-11 ENCOUNTER — Other Ambulatory Visit: Payer: Self-pay

## 2018-11-11 ENCOUNTER — Encounter: Payer: Self-pay | Admitting: Neurology

## 2018-11-11 ENCOUNTER — Ambulatory Visit (INDEPENDENT_AMBULATORY_CARE_PROVIDER_SITE_OTHER): Payer: Medicare HMO | Admitting: Neurology

## 2018-11-11 DIAGNOSIS — G609 Hereditary and idiopathic neuropathy, unspecified: Secondary | ICD-10-CM

## 2018-11-11 DIAGNOSIS — G629 Polyneuropathy, unspecified: Secondary | ICD-10-CM

## 2018-11-11 DIAGNOSIS — R202 Paresthesia of skin: Secondary | ICD-10-CM | POA: Diagnosis not present

## 2018-11-11 HISTORY — DX: Polyneuropathy, unspecified: G62.9

## 2018-11-11 MED ORDER — GABAPENTIN 300 MG PO CAPS: ORAL_CAPSULE | ORAL | 3 refills | Status: DC

## 2018-11-11 NOTE — Progress Notes (Signed)
Please refer to EMG and nerve conduction procedure note.  

## 2018-11-11 NOTE — Progress Notes (Signed)
The patient comes in for EMG nerve conduction studies today.  Nerve conductions and EMG reveal evidence of a peripheral neuropathy.  There is some acute and chronic denervation below the knee on the EMG study.  The patient is on gabapentin taking 200 mg 3 times daily, he is tolerating the drug but is not getting good pain relief.  We will go up on the gabapentin taking 300 mg 3 times daily for a week and then go to 300 mg twice during the day and 600 milligrams at night.

## 2018-11-11 NOTE — Procedures (Signed)
     HISTORY:  Wayne Middleton is a 78 year old gentleman with onset of numbness and tingling sensations all 4 extremities.  The patient reports some discomfort associated with the numbness.  He does have some mild gait instability.  He is being evaluated for possible peripheral neuropathy or a radiculopathy.  NERVE CONDUCTION STUDIES:  Nerve conduction studies were performed on the left upper extremity.  The distal motor latencies for the left median nerve was prolonged with a low motor amplitude.  The distal motor latency of the left ulnar nerve was normal with a low motor amplitude.  Slowing was seen above and below the elbow for the left ulnar nerve, normal for the left median nerve.  Left radial sensory latency was normal, and the sensory latencies for the left median and ulnar nerves were prolonged.  The F-wave latency for the left ulnar nerve was prolonged.  Nerve conduction studies were performed on both lower extremities.  The distal motor latency for the right peroneal nerve was prolonged with a low motor amplitude.  No response was seen for the left peroneal nerve.  The right posterior tibial nerve reveals a normal distal motor latency with a low motor amplitude.  No response was seen for the left posterior tibial nerve.  The nerve conduction velocities for the right peroneal nerve and for the right posterior tibial nerves were significantly slowed.  The sensory latency for the right sural nerve was normal but was unobtainable on the left and unobtainable for the peroneal nerves bilaterally.  The F-wave latency for the right posterior tibial nerve was unobtainable.  EMG STUDIES:  EMG study was performed on the right lower extremity:  The tibialis anterior muscle reveals 2 to 4K motor units with decreased recruitment. No fibrillations or positive waves were seen. The peroneus tertius muscle reveals 2 to 4K motor units with decreased recruitment. 1+ positive waves were seen. The medial  gastrocnemius muscle reveals 1 to 4K motor units with slightly decreased recruitment. 1+ positive waves were seen. The vastus lateralis muscle reveals 2 to 4K motor units with full recruitment. No fibrillations or positive waves were seen. The iliopsoas muscle reveals 2 to 4K motor units with full recruitment. No fibrillations or positive waves were seen. The biceps femoris muscle (long head) reveals 2 to 4K motor units with full recruitment. No fibrillations or positive waves were seen. The lumbosacral paraspinal muscles were tested at 3 levels, and revealed no abnormalities of insertional activity at all 3 levels tested. There was good relaxation.   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  EMG of the right lower extremity shows chronic and acute distal denervation consistent with the diagnosis of peripheral neuropathy.  There is no evidence of an overlying lumbosacral radiculopathy.   Jill Alexanders MD 11/11/2018 1:50 PM  [facility] [address] [place], [place] [address]  Phone [phone] Fax [phone]

## 2018-12-07 ENCOUNTER — Telehealth: Payer: Self-pay

## 2018-12-07 NOTE — Telephone Encounter (Signed)
Left vm asking the pt to call me back so we could reschedule his 02/16/2019 appt due to Dr. Jannifer Franklin schedule changing. Please reschedule with MD.

## 2019-02-16 ENCOUNTER — Ambulatory Visit: Payer: Medicare HMO | Admitting: Neurology

## 2019-03-30 ENCOUNTER — Ambulatory Visit: Payer: Medicare HMO | Admitting: Neurology

## 2019-05-12 ENCOUNTER — Telehealth: Payer: Self-pay

## 2019-05-12 NOTE — Telephone Encounter (Signed)

## 2019-05-13 ENCOUNTER — Encounter: Payer: Self-pay | Admitting: Cardiology

## 2019-05-13 NOTE — Progress Notes (Signed)
Virtual Visit via Telephone Note   This visit type was conducted due to national recommendations for restrictions regarding the COVID-19 Pandemic (e.g. social distancing) in an effort to limit this patient's exposure and mitigate transmission in our community.  Due to his co-morbid illnesses, this patient is at least at moderate risk for complications without adequate follow up.  This format is felt to be most appropriate for this patient at this time.  The patient did not have access to video technology/had technical difficulties with video requiring transitioning to audio format only (telephone).  All issues noted in this document were discussed and addressed.  No physical exam could be performed with this format.  Please refer to the patient's chart for his  consent to telehealth for [facility].   The patient was identified using 2 identifiers.  Date:  05/14/2019   ID:  Wayne Middleton 06/01/1940, MRN [id number]  Patient Location: Home Provider Location: Home  PCP:  Lemmie Evens, MD  Cardiologist:  Satira Sark, MD Electrophysiologist:  None   Evaluation Performed:  Follow-Up Visit  Chief Complaint:  Cardiac follow-up  History of Present Illness:    Wayne Middleton is a 79 y.o. male last seen in January 2020.  We spoke by phone today.  He states that he has felt a jitteriness in his chest, recent heart rates in the 80-100 range based on home check.  He is in permanent atrial fibrillation and has been switched from atenolol to Cardizem CD by Dr. Karie Kirks over the last year.  He has been on Coumadin with follow-up by Dr. Karie Kirks.  He does not report any bleeding problems or changes in stool.  He tells me that recently both his sister and his adopted daughter died of COVID-54.  He got his first dose of vaccine.  I reviewed his medications which are outlined below.  Past Medical History:  Diagnosis Date  . Atrial fibrillation ([place])   . 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 Cholelithiasis   . Chronic anticoagulation   . Chronic anxiety   . Diabetes ([place])   . Essential hypertension   . History of GI bleed 2011   Hospitalized for lower GI bleed following polypectomy  . Peripheral neuropathy 11/11/2018   Past Surgical History:  Procedure Laterality Date  . BACK SURGERY  1971   post-trauma  . COLONOSCOPY W/ POLYPECTOMY  2006, 2011   postprocedure bleed requiring hospital admission  . 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 allopurinol (ZYLOPRIM) 100 MG tablet Take 100 mg by mouth daily.  Marland Kitchen gabapentin (NEURONTIN) 300 MG capsule Take 300 mg by mouth 3 (three) times daily.  . potassium chloride (MICRO-K) 10 MEQ CR capsule Take 10 mEq by mouth daily.   . pregabalin (LYRICA) 50 MG capsule Take 50 mg by mouth 3 (three) times daily.  Marland Kitchen warfarin (COUMADIN) 4 MG tablet Take 4 mg by mouth as directed.  . zolpidem (AMBIEN) 10 MG tablet Take 10 mg by mouth at bedtime as needed.    . [DISCONTINUED] diltiazem (TIAZAC) 180 MG 24 hr capsule Take 180 mg by mouth daily.      Allergies:   Patient has no known allergies.   ROS:   No dizziness or syncope.  Prior CV studies:   The following studies were reviewed today:  Echocardiogram 01/20/2017: Study Conclusions  -  Left ventricle: The cavity size was normal. Wall thickness was increased in a pattern of mild LVH. Systolic function was mildly reduced. The estimated ejection fraction was in the range of 45% to 50%. Diffuse hypokinesis. The study was not technically sufficient to allow evaluation of LV diastolic dysfunction due to atrial fibrillation. - Aortic valve: Sclerosis without stenosis. There was mild to moderate eccentric regurgitation. - Mitral valve: There was mild regurgitation. - Right atrium: The atrium was moderately dilated. - Tricuspid valve: There was mild  regurgitation. - Pulmonary arteries: PA peak pressure: 40 mm Hg (S).  Labs/Other Tests and Data Reviewed:    EKG:  An ECG dated 03/26/2018 was personally reviewed today and demonstrated:  Atrial fibrillation with poor R wave progression and nonspecific ST changes.  Recent Labs:  No interval lab work for review today.  Wt Readings from Last 3 Encounters:  05/14/19 200 lb (90.7 kg)  09/22/18 211 lb 2 oz (95.8 kg)  03/26/18 203 lb (92.1 kg)     Objective:    Vital Signs:  BP (!) 168/95   Pulse 87   Ht 6\' 1"  (1.854 m)   Wt 200 lb (90.7 kg)   BMI 26.39 kg/m    Patient spoke in full sentences, not short of breath. No audible wheezing or coughing. Speech pattern normal.  ASSESSMENT & PLAN:    1.  Permanent atrial fibrillation.  He has been switched from atenolol to Cardizem CD by Dr. .  Increase Cardizem CD to 240 mg daily for better heart rate control and reduction in sense of palpitations.  Otherwise he continues on Coumadin with follow-up by Dr. Sudie Bailey.  2.  History of tachycardia-mediated cardiomyopathy.  Last LVEF was 45 to 50%, no new heart failure symptoms reported.  Continue observation.  Time:   Today, I have spent 8 minutes with the patient with telehealth technology discussing the above problems.     Medication Adjustments/Labs and Tests Ordered: Current medicines are reviewed at length with the patient today.  Concerns regarding medicines are outlined above.   Tests Ordered: No orders of the defined types were placed in this encounter.   Medication Changes: Meds ordered this encounter  Medications  . diltiazem (CARDIZEM CD) 240 MG 24 hr capsule    Sig: Take 1 capsule (240 mg total) by mouth daily.    Dispense:  90 capsule    Refill:  3    Follow Up:  In Person 6 months in the [place] office.  Signed, Garrison, MD  05/14/2019 10:56 AM    [facility]

## 2019-05-14 ENCOUNTER — Encounter: Payer: Self-pay | Admitting: Cardiology

## 2019-05-14 ENCOUNTER — Telehealth (INDEPENDENT_AMBULATORY_CARE_PROVIDER_SITE_OTHER): Payer: Medicare HMO | Admitting: Cardiology

## 2019-05-14 VITALS — BP 168/95 | HR 87 | Ht 73.0 in | Wt 200.0 lb

## 2019-05-14 DIAGNOSIS — I4821 Permanent atrial fibrillation: Secondary | ICD-10-CM | POA: Diagnosis not present

## 2019-05-14 DIAGNOSIS — Z8679 Personal history of other diseases of the circulatory system: Secondary | ICD-10-CM | POA: Diagnosis not present

## 2019-05-14 MED ORDER — DILTIAZEM HCL ER COATED BEADS 240 MG PO CP24: 240.0000 mg | ORAL_CAPSULE | Freq: Every day | ORAL | 3 refills | Status: DC

## 2019-05-14 NOTE — Patient Instructions (Signed)
Medication Instructions:  INCREASE Cardizem CD to 240 mg daily   *If you need a refill on your cardiac medications before your next appointment, please call your pharmacy*   Lab Work: None today If you have labs (blood work) drawn today and your tests are completely normal, you will receive your results only by: Marland Kitchen MyChart Message (if you have MyChart) OR . A paper copy in the mail If you have any lab test that is abnormal or we need to change your treatment, we will call you to review the results.   Testing/Procedures: None today   Follow-Up: At [facility], you and your health needs are our priority.  As part of our continuing mission to provide you with exceptional heart care, we have created designated Provider Care Teams.  These Care Teams include your primary Cardiologist (physician) and Advanced Practice Providers (APPs -  Physician Assistants and Nurse Practitioners) who all work together to provide you with the care you need, when you need it.  We recommend signing up for the patient portal called "MyChart".  Sign up information is provided on this After Visit Summary.  MyChart is used to connect with patients for Virtual Visits (Telemedicine).  Patients are able to view lab/test results, encounter notes, upcoming appointments, etc.  Non-urgent messages can be sent to your provider as well.   To learn more about what you can do with MyChart, go to [email or web address].    Your next appointment:   6 month(s)  The format for your next appointment:   In Person  Provider:   Dr.McDowell in the [place] office   Other Instructions None        Thank you for choosing [facility] !

## 2019-12-01 ENCOUNTER — Encounter: Payer: Self-pay | Admitting: Cardiology

## 2019-12-01 ENCOUNTER — Other Ambulatory Visit: Payer: Self-pay

## 2019-12-01 ENCOUNTER — Ambulatory Visit: Payer: Medicare HMO | Admitting: Cardiology

## 2019-12-01 VITALS — BP 144/86 | HR 78 | Ht 74.0 in | Wt 222.8 lb

## 2019-12-01 DIAGNOSIS — Z8679 Personal history of other diseases of the circulatory system: Secondary | ICD-10-CM | POA: Diagnosis not present

## 2019-12-01 DIAGNOSIS — I4821 Permanent atrial fibrillation: Secondary | ICD-10-CM | POA: Diagnosis not present

## 2019-12-01 NOTE — Patient Instructions (Signed)
Medication Instructions:  °Your physician recommends that you continue on your current medications as directed. Please refer to the Current Medication list given to you today. ° °*If you need a refill on your cardiac medications before your next appointment, please call your pharmacy* ° ° °Lab Work: °None today °If you have labs (blood work) drawn today and your tests are completely normal, you will receive your results only by: °• MyChart Message (if you have MyChart) OR °• A paper copy in the mail °If you have any lab test that is abnormal or we need to change your treatment, we will call you to review the results. ° ° °Testing/Procedures: °None today ° ° °Follow-Up: °At [facility], you and your health needs are our priority.  As part of our continuing mission to provide you with exceptional heart care, we have created designated Provider Care Teams.  These Care Teams include your primary Cardiologist (physician) and Advanced Practice Providers (APPs -  Physician Assistants and Nurse Practitioners) who all work together to provide you with the care you need, when you need it. ° °We recommend signing up for the patient portal called "MyChart".  Sign up information is provided on this After Visit Summary.  MyChart is used to connect with patients for Virtual Visits (Telemedicine).  Patients are able to view lab/test results, encounter notes, upcoming appointments, etc.  Non-urgent messages can be sent to your provider as well.   °To learn more about what you can do with MyChart, go to [email or web address].   ° °Your next appointment:   °6 month(s) ° °The format for your next appointment:   °In Person ° °Provider:   °Samuel McDowell, MD ° ° °Other Instructions °None ° ° ° ° °Thank you for choosing [facility] ! ° ° ° ° ° ° ° ° °

## 2019-12-01 NOTE — Progress Notes (Signed)
Cardiology Office Note  Date: 12/01/2019   ID: Wayne Middleton, Wayne Middleton Jan 08, 1941, MRN [id number]  PCP:  Gareth Morgan, MD  Cardiologist:  Nona Dell, MD Electrophysiologist:  None   Chief Complaint  Patient presents with  . Cardiac follow-up    History of Present Illness: Wayne Middleton is an 79 y.o. male last via telehealth encounter in March. He presents for a routine visit.  He does not report any active sense of palpitations at this time on Cardizem CD.  Remains functional with ADLs.  He does not report any exertional chest pain or worsening shortness of breath.  He is on Coumadin with follow-up by Dr. Sudie Bailey.  Denies any spontaneous bleeding problems or changes in stool.  We are requesting his most recent lab work for review.  I reviewed his medications which are outlined below.  Past Medical History:  Diagnosis Date  . Atrial fibrillation ([facility])   . 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 Cholelithiasis   . Chronic anticoagulation   . Chronic anxiety   . Diabetes ([facility])   . Essential hypertension   . History of GI bleed 2011   Hospitalized for lower GI bleed following polypectomy  . Peripheral neuropathy 11/11/2018    Past Surgical History:  Procedure Laterality Date  . BACK SURGERY  1971   post-trauma  . COLONOSCOPY W/ POLYPECTOMY  2006, 2011   postprocedure bleed requiring hospital admission  . 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 allopurinol (ZYLOPRIM) 100 MG tablet Take 100 mg by mouth daily.    Marland Kitchen diltiazem (CARDIZEM CD) 240 MG 24 hr capsule Take 1 capsule (240 mg total) by mouth daily. 90 capsule 3  . gabapentin (NEURONTIN) 300 MG capsule Take 300 mg by mouth 3 (three) times daily.    . potassium chloride (MICRO-K) 10 MEQ CR capsule Take 10 mEq by mouth daily.     . pregabalin (LYRICA) 50 MG capsule Take 50 mg by  mouth 3 (three) times daily.    Marland Kitchen warfarin (COUMADIN) 4 MG tablet Take 4 mg by mouth as directed.    . zolpidem (AMBIEN) 10 MG tablet Take 10 mg by mouth at bedtime as needed.       No current facility-administered medications for this visit.   Allergies:  Patient has no known allergies.   ROS:   No syncope.  Neuropathy.  Physical Exam: VS:  BP (!) 144/86   Pulse 78   Ht 6\' 2"  (1.88 m)   Wt 222 lb 12.8 oz (101.1 kg)   SpO2 94%   BMI 28.61 kg/m , BMI Body mass index is 28.61 kg/m.  Wt Readings from Last 3 Encounters:  12/01/19 222 lb 12.8 oz (101.1 kg)  05/14/19 200 lb (90.7 kg)  09/22/18 211 lb 2 oz (95.8 kg)    General: Patient appears comfortable at rest. HEENT: Conjunctiva and lids normal, wearing a mask. Neck: Supple, no elevated JVP or carotid bruits, no thyromegaly. Lungs: Clear to auscultation, nonlabored breathing at rest. Cardiac: Irregularly irregular, no S3 or significant systolic murmur. Extremities: No pitting edema, distal pulses 2+.  ECG:  An ECG dated 03/26/2018 was personally reviewed today and demonstrated:  Atrial fibrillation with poor R wave progression and nonspecific ST changes.  Recent Labwork: No results found for requested labs within last 8760 hours.  Component Value Date/Time   CHOL 113 09/15/2009 0000   TRIG 128 09/15/2009 0000   HDL 35 09/15/2009 0000   CHOLHDL 3.2 09/13/2009 0404   VLDL 26 09/13/2009 0404   LDLCALC 52 09/15/2009 0000    Other Studies Reviewed Today:  Echocardiogram 01/20/2017: Study Conclusions  - Left ventricle: The cavity size was normal. Wall thickness was increased in a pattern of mild LVH. Systolic function was mildly reduced. The estimated ejection fraction was in the range of 45% to 50%. Diffuse hypokinesis. The study was not technically sufficient to allow evaluation of LV diastolic dysfunction due to atrial fibrillation. - Aortic valve: Sclerosis without stenosis. There was mild  to moderate eccentric regurgitation. - Mitral valve: There was mild regurgitation. - Right atrium: The atrium was moderately dilated. - Tricuspid valve: There was mild regurgitation. - Pulmonary arteries: PA peak pressure: 40 mm Hg (S).  Assessment and Plan:  1.  Permanent atrial fibrillation.  CHA2DS2-VASc score is 4.  He is on Coumadin for stroke prophylaxis with follow-up by Dr. Sudie Bailey.  No sense of palpitations at this point on Cardizem CD 240 mg daily.  Continue observation.  2.  History of tachycardia-mediated cardiomyopathy with LVEF 45 to 50% by last assessment, improved with heart rate control.  Medication Adjustments/Labs and Tests Ordered: Current medicines are reviewed at length with the patient today.  Concerns regarding medicines are outlined above.   Tests Ordered: Orders Placed This Encounter  Procedures  . EKG 12-Lead    Medication Changes: No orders of the defined types were placed in this encounter.   Disposition:  Follow up 6 months in the [place] office.  Signed, Jonelle Sidle, MD, [facility] 12/01/2019 11:25 AM    [facility] at [facility] [address], [place], [place] [address] Phone: [phone]; Fax: [phone]

## 2020-06-19 ENCOUNTER — Other Ambulatory Visit: Payer: Self-pay | Admitting: Cardiology

## 2020-09-10 ENCOUNTER — Other Ambulatory Visit: Payer: Self-pay

## 2020-09-10 ENCOUNTER — Ambulatory Visit
Admission: EM | Admit: 2020-09-10 | Discharge: 2020-09-10 | Disposition: A | Discharge status: Home / Self Care | Payer: Medicare HMO | Attending: Family Medicine | Admitting: Family Medicine

## 2020-09-10 DIAGNOSIS — M5432 Sciatica, left side: Secondary | ICD-10-CM

## 2020-09-10 DIAGNOSIS — M5431 Sciatica, right side: Secondary | ICD-10-CM

## 2020-09-10 MED ORDER — DEXAMETHASONE SODIUM PHOSPHATE 10 MG/ML IJ SOLN
10.0000 mg | Freq: Once | INTRAMUSCULAR | Status: AC
  Administered 2020-09-10: 10 mg via INTRAMUSCULAR

## 2020-09-10 NOTE — Discharge Instructions (Addendum)
Follow-up with your PCP regarding long-term treatment of sciatica related pain.

## 2020-09-13 ENCOUNTER — Other Ambulatory Visit: Payer: Self-pay | Admitting: Nurse Practitioner

## 2020-09-13 ENCOUNTER — Other Ambulatory Visit (HOSPITAL_COMMUNITY): Payer: Self-pay | Admitting: Nurse Practitioner

## 2020-09-13 DIAGNOSIS — G8929 Other chronic pain: Secondary | ICD-10-CM

## 2020-09-14 ENCOUNTER — Ambulatory Visit (HOSPITAL_COMMUNITY)
Admission: RE | Admit: 2020-09-14 | Discharge: 2020-09-14 | Disposition: A | Discharge status: Home / Self Care | Payer: Medicare HMO | Source: Ambulatory Visit | Attending: Nurse Practitioner | Admitting: Nurse Practitioner

## 2020-09-14 ENCOUNTER — Other Ambulatory Visit: Payer: Self-pay

## 2020-09-14 DIAGNOSIS — M5442 Lumbago with sciatica, left side: Secondary | ICD-10-CM | POA: Insufficient documentation

## 2020-09-14 DIAGNOSIS — G8929 Other chronic pain: Secondary | ICD-10-CM

## 2020-09-19 ENCOUNTER — Other Ambulatory Visit: Payer: Self-pay | Admitting: Cardiology

## 2020-09-21 NOTE — ED Provider Notes (Signed)
[facility]    CSN: [id number] Arrival date & time: 09/10/20  1345      History   Chief Complaint No chief complaint on file.   HPI Wayne Middleton is a 80 y.o. male.   HPI Patient presents today with back pain. No injury. History of chronic back pain . Denies fall. He is chronically anticoagulated and is unable to take NSAIDS. Denies focal neurological  Past Medical History:  Diagnosis Date   Atrial fibrillation ([facility])    Cardiomyopathy ([facility])    Tachycardia mediated, LVEF improved to 50-55% as of 2012   Cholelithiasis    Chronic anticoagulation    Chronic anxiety    Diabetes ([facility])    Essential hypertension    History of GI bleed 2011   Hospitalized for lower GI bleed following polypectomy   Peripheral neuropathy 11/11/2018    Patient Active Problem List   Diagnosis Date Noted   Peripheral neuropathy 11/11/2018   History of cardiomyopathy 11/03/2013   Chronic atrial fibrillation ([facility])    Chronic anticoagulation    Essential hypertension, benign     Past Surgical History:  Procedure Laterality Date   BACK SURGERY  1971   post-trauma   COLONOSCOPY W/ POLYPECTOMY  2006, 2011   postprocedure bleed requiring hospital admission   TUMOR EXCISION  1960s   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 Authorizing Provider  allopurinol (ZYLOPRIM) 100 MG tablet Take 100 mg by mouth daily.    [provider]  diltiazem (CARDIZEM CD) 240 MG 24 hr capsule TAKE ONE CAPSULE BY MOUTH ONCE DAILY. 09/19/20   Jonelle Sidle, MD  gabapentin (NEURONTIN) 300 MG capsule Take 300 mg by mouth 3 (three) times daily.    [provider]  potassium chloride (MICRO-K) 10 MEQ CR capsule Take 10 mEq by mouth daily.  04/14/19   [provider]  pregabalin (LYRICA) 50 MG capsule Take 50 mg by mouth 3 (three) times daily.    [provider]  warfarin  (COUMADIN) 4 MG tablet Take 4 mg by mouth as directed.    [provider]  zolpidem (AMBIEN) 10 MG tablet Take 10 mg by mouth at bedtime as needed.      [provider]    Family History Family History  Problem Relation Age of Onset   Stroke Mother 70   Heart attack Father 46   Lung cancer Brother        3 of 5 brothers deceased due to Carcinoma of the lung    Social History Social History   Tobacco Use   Smoking status: Never   Smokeless tobacco: Never  Vaping Use   Vaping Use: Never used  Substance Use Topics   Alcohol use: No    Alcohol/week: 0.0 standard drinks   Drug use: No     Allergies   Patient has no known allergies.   Review of Systems Review of Systems Pertinent negatives listed in HPI   Physical Exam Triage Vital Signs ED Triage Vitals  Enc Vitals Group     BP 09/10/20 1526 (!) 169/96     Pulse Rate 09/10/20 1526 88     Resp --      Temp 09/10/20 1526 97.6 F (36.4 C)     Temp Source 09/10/20 1526 Oral     SpO2 09/10/20 1526 95 %  Weight --      Height --      Head Circumference --      Peak Flow --      Pain Score 09/10/20 1614 10     Pain Loc --      Pain Edu? --      Excl. in GC? --    No data found.  Updated Vital Signs BP (!) 169/96 (BP Location: Right Arm)   Pulse 88   Temp 97.6 F (36.4 C) (Oral)   SpO2 95%   Visual Acuity Right Eye Distance:   Left Eye Distance:   Bilateral Distance:    Right Eye Near:   Left Eye Near:    Bilateral Near:     Physical Exam General appearance: Alert, chronically ill appearing, no acute distress Head: Normocephalic, without obvious abnormality, atraumatic Respiratory: Respirations even and unlabored, normal respiratory rate Heart: Rate normal with irregular rhythm normal. No gallop or murmurs noted on exam  Back: Mild kyphosis present. Antalgic gait. Full ROM Extremities: No gross deformities Skin: Skin color, texture, turgor normal. No rashes seen  Psych:  Appropriate mood and affect. Neurologic:  No focal neurological deficits  UC Treatments / Results  Labs (all labs ordered are listed, but only abnormal results are displayed) Labs Reviewed - No data to display  EKG   Radiology No results found.  Procedures Procedures (including critical care time)  Medications Ordered in UC Medications  dexamethasone (DECADRON) injection 10 mg (10 mg Intramuscular Given 09/10/20 1612)    Initial Impression / Assessment and Plan / UC Course  I have reviewed the triage vital signs and the nursing notes.  Pertinent labs & imaging results that were available during my care of the patient were reviewed by me and considered in my medical decision making (see chart for details).     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 Treatment today with decadron 10 mg IM. Recommended OTC lidocaine patches and continue tylenol. Follow-up with PCP as needed. Final Clinical Impressions(s) / UC Diagnoses   Final diagnoses:  Bilateral sciatica     Discharge Instructions      Follow-up with your PCP regarding long-term treatment of sciatica related pain.   ED Prescriptions   None    PDMP not reviewed this encounter.   Bing Neighbors, FNP 09/21/20 [phone]

## 2020-10-25 ENCOUNTER — Other Ambulatory Visit: Payer: Self-pay | Admitting: Cardiology

## 2021-01-22 NOTE — Progress Notes (Addendum)
Cardiology Office Note   Date:  01/23/2021   ID:  Garney, Minion [birth year], MRN [id number]  PCP:  Lemmie Evens, MD  Cardiologist:  Dr. Domenic Polite    Chief Complaint  Patient presents with   Atrial Fibrillation      History of Present Illness: Wayne Middleton is a 80 y.o. male who presents for Atrial fib.    He is on Coumadin with follow-up by Dr. Karie Kirks.    Last seen 2021 by Dr. Domenic Polite.  Pemrnent a fib CHA2DS2VASc of 4 on coumadin.  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  Today denies chest pain, his sone is with him for visit.  Pt with more congestion than usual. Some SOB.  + edema.  Occ racing heart rate about 3 times per month.  His BP is elevated here and his son notes at home freq 167/117.  He is tired freq.  The pt does not want to talk about much. He states he is hungry and wants to leave.   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     atenolol (TENORMIN) 100 MG tablet Take 100 mg by mouth daily.     cetirizine (ZYRTEC) 10 MG tablet Take 10 mg by mouth daily.     diazepam (VALIUM) 10 MG tablet SMARTSIG:0.5 Tablet(s) By Mouth 1-2 Times Daily     diltiazem  (CARDIZEM CD) 240 MG 24 hr capsule TAKE ONE CAPSULE BY MOUTH ONCE DAILY. 15 capsule 0   gabapentin (NEURONTIN) 300 MG capsule Take 300 mg by mouth 3 (three) times daily.     methocarbamol (ROBAXIN) 500 MG tablet SMARTSIG:2 Tablet(s) By Mouth 1-4 Times Daily     oxyCODONE (ROXICODONE) 15 MG immediate release tablet Take 15 mg by mouth 3 (three) times daily.     potassium chloride (MICRO-K) 10 MEQ CR capsule Take 10 mEq by mouth daily.      pregabalin (LYRICA) 50 MG capsule Take 50 mg by mouth 3 (three) times daily.     warfarin (COUMADIN) 4 MG tablet Take 4 mg by mouth as directed.     zolpidem (AMBIEN) 10 MG tablet Take 10 mg by mouth at bedtime as needed.       No current facility-administered medications for this visit.    Allergies:   Patient has no known allergies.    Social History:  The patient  reports that he has never smoked. He has never used smokeless tobacco. He reports that he does not drink alcohol and does not use drugs.   Family History:  The patient's family history includes Heart attack (age of  onset: 23) in his father; Lung cancer in his brother; Stroke (age of onset: 61) in his mother.    ROS:  General:no colds or fevers, + weight increase since 05/2019  Skin:no rashes or ulcers HEENT:no blurred vision, no congestion CV:see HPI PUL:see HPI GI:no diarrhea constipation or melena, no indigestion GU:no hematuria, no dysuria MS:no joint pain, no claudication Neuro:no syncope, no lightheadedness Endo:+DM , no thyroid disease  Wt Readings from Last 3 Encounters:  01/23/21 232 lb (105.2 kg)  12/01/19 222 lb 12.8 oz (101.1 kg)  05/14/19 200 lb (90.7 kg)     PHYSICAL EXAM: VS:  BP (!) 142/82   Pulse (!) 58   Ht 6\' 2"  (1.88 m)   Wt 232 lb (105.2 kg)   SpO2 94%   BMI 29.79 kg/m  , BMI Body mass index is 29.79 kg/m. General:Pleasant affect, NAD Skin:Warm and dry, brisk capillary refill HEENT:normocephalic, sclera clear, mucus membranes moist Neck:supple, no  JVD, no bruits  Heart:irreg irreg without murmur, gallup, rub or click Lungs:+ rales and rhonchi and occ wheezes [id number], non tender, + BS, do not palpate liver spleen or masses Ext:2+ lower ext edema, 2+ pedal pulses, 2+ radial pulses Neuro:alert and oriented X 3, MAE, follows commands, + facial symmetry    EKG:  EKG is ordered today. The ekg ordered today demonstrates atrial fib HR 58 no ST changes.     Recent Labs: No results found for requested labs within last 8760 hours.    Lipid Panel    Component Value Date/Time   CHOL 113 09/15/2009 0000   TRIG 128 09/15/2009 0000   HDL 35 09/15/2009 0000   CHOLHDL 3.2 09/13/2009 0404   VLDL 26 09/13/2009 0404   LDLCALC 52 09/15/2009 0000       Other studies Reviewed: Additional studies/ records that were reviewed today include:  Echo 2018  .Study Conclusions   - Left ventricle: The cavity size was normal. Wall thickness was    increased in a pattern of mild LVH. Systolic function was mildly    reduced. The estimated ejection fraction was in the range of 45%    to 50%. Diffuse hypokinesis. The study was not technically    sufficient to allow evaluation of LV diastolic dysfunction due to    atrial fibrillation.  - Aortic valve: Sclerosis without stenosis. There was mild to    moderate eccentric regurgitation.  - Mitral valve: There was mild regurgitation.  - Right atrium: The atrium was moderately dilated.  - Tricuspid valve: There was mild regurgitation.  - Pulmonary arteries: PA peak pressure: 40 mm Hg (S).   Impressions:   - Rapid atrial fibrillation noted throughout the study.    ASSESSMENT AND PLAN:  1.  Permanent Atrial fib rate controlled, continue BB and dilt.  Has episodes of rapid HR about 3 times per month for about an hour, we discussed event monitor but pt not wanting to do currently though if symptoms increase then will consider.  He is on coumadin and followed by Dr. Karie Kirks.  Review of old notes.  No  bleeding issues on coumadin.   2.  Congestion has increased recently - not on lasix will and he does have lower ext edema.  Will check BMP and BNP for SOB and check CXR - may need to add lasix. He denies any fever or colds.   Will arrange follow up once we know labs and CXR.  If + CHF and I suspect that is issue will recheck  echo.   3.  HTN is elevated and has been higher frequently at home.  Will ad cozaar 50 mg daily, do not wish to go higher on BB or dilt due to [place] today.  If Cr is elevated will need to rethink.          Current medicines are reviewed with the patient today.  The patient Has no concerns regarding medicines.  The following changes have been made:  See above Labs/ tests ordered today include:see above  Disposition:   FU:  see above  Signed, Nada Boozer, NP  01/23/2021 2:21 PM    [facility] [address] [place], [place], [place]  [address]/ [address] [place], [place] Phone: [phone]; Fax: [phone]  [phone]

## 2021-01-23 ENCOUNTER — Other Ambulatory Visit (HOSPITAL_COMMUNITY)
Admission: RE | Admit: 2021-01-23 | Discharge: 2021-01-23 | Disposition: A | Discharge status: Home / Self Care | Payer: Medicare HMO | Source: Ambulatory Visit | Attending: Cardiology | Admitting: Cardiology

## 2021-01-23 ENCOUNTER — Ambulatory Visit: Payer: Medicare HMO | Admitting: Cardiology

## 2021-01-23 ENCOUNTER — Encounter: Payer: Self-pay | Admitting: Cardiology

## 2021-01-23 ENCOUNTER — Ambulatory Visit (HOSPITAL_COMMUNITY)
Admission: RE | Admit: 2021-01-23 | Discharge: 2021-01-23 | Disposition: A | Discharge status: Home / Self Care | Payer: Medicare HMO | Source: Ambulatory Visit | Attending: Cardiology | Admitting: Cardiology

## 2021-01-23 ENCOUNTER — Other Ambulatory Visit: Payer: Self-pay

## 2021-01-23 VITALS — BP 142/82 | HR 58 | Ht 74.0 in | Wt 232.0 lb

## 2021-01-23 DIAGNOSIS — R0602 Shortness of breath: Secondary | ICD-10-CM | POA: Insufficient documentation

## 2021-01-23 DIAGNOSIS — Z7901 Long term (current) use of anticoagulants: Secondary | ICD-10-CM

## 2021-01-23 DIAGNOSIS — I482 Chronic atrial fibrillation, unspecified: Secondary | ICD-10-CM | POA: Insufficient documentation

## 2021-01-23 DIAGNOSIS — Z8679 Personal history of other diseases of the circulatory system: Secondary | ICD-10-CM | POA: Diagnosis not present

## 2021-01-23 DIAGNOSIS — I10 Essential (primary) hypertension: Secondary | ICD-10-CM

## 2021-01-23 LAB — BASIC METABOLIC PANEL
Anion gap: 6 (ref 5–15)
BUN: 14 mg/dL (ref 8–23)
CO2: 29 mmol/L (ref 22–32)
Calcium: 8.9 mg/dL (ref 8.9–10.3)
Chloride: 102 mmol/L (ref 98–111)
Creatinine, Ser: 0.88 mg/dL (ref 0.61–1.24)
GFR, Estimated: >60 mL/min (ref >60)
Glucose, Bld: 186 mg/dL — ABNORMAL HIGH (ref 70–99)
Potassium: 4.5 mmol/L (ref 3.5–5.1)
Sodium: 137 mmol/L (ref 135–145)

## 2021-01-23 LAB — BRAIN NATRIURETIC PEPTIDE: B Natriuretic Peptide: 153 pg/mL — ABNORMAL HIGH (ref 0.0–100.0)

## 2021-01-23 MED ORDER — LOSARTAN POTASSIUM 50 MG PO TABS: 50.0000 mg | ORAL_TABLET | Freq: Every day | ORAL | 3 refills | Status: DC

## 2021-01-23 NOTE — Patient Instructions (Signed)
Medication Instructions:  START Cozaar 50 mg daily for blood pressure control.    Labwork: BMET,BNP now   Testing/Procedures: Chest X-ray NOW   Follow-Up: To be determined after x-ray and lab work  Any Other Special Instructions Will Be Listed Below (If Applicable).  If you need a refill on your cardiac medications before your next appointment, please call your pharmacy.

## 2021-01-29 ENCOUNTER — Telehealth: Payer: Self-pay | Admitting: *Deleted

## 2021-01-29 DIAGNOSIS — R0602 Shortness of breath: Secondary | ICD-10-CM

## 2021-01-29 MED ORDER — FUROSEMIDE 20 MG PO TABS: 20.0000 mg | ORAL_TABLET | Freq: Every day | ORAL | 3 refills | Status: DC

## 2021-01-29 NOTE — Telephone Encounter (Signed)
-----   Message from Leone Brand, NP sent at 01/24/2021 12:01 PM EST ----- Please let Wayne Middleton know that he does have fluid in his lungs and his heart is larger which could mean it is not pumping very well.  So we need to add lasix 20 mg 2 tabs every morning for 3 days then 1 daily.  He does need echo to eval his heart's structure to see if pumping well.  Then follow up with Dr. Diona Browner in 2-3 weeks.  Tell him fixing these issues may help him feel better.   Thanks.  His labs were good.  Just too much fluid.

## 2021-02-15 ENCOUNTER — Telehealth: Payer: Self-pay | Admitting: Cardiology

## 2021-02-15 MED ORDER — DILTIAZEM HCL ER COATED BEADS 240 MG PO CP24: 240.0000 mg | ORAL_CAPSULE | Freq: Every day | ORAL | 0 refills | Status: DC

## 2021-02-15 NOTE — Telephone Encounter (Signed)
Refilled Cardizem 240 mg #90 RF: 0 to [facility]

## 2021-02-15 NOTE — Telephone Encounter (Signed)
*  STAT* If patient is at the pharmacy, call can be transferred to refill team.   1. Which medications need to be refilled? (please list name of each medication and dose if known)  diltiazem (CARDIZEM CD) 240 MG 24 hr capsule [[id number]]    2. Which pharmacy/location (including street and city if local pharmacy) is medication to be sent to?  [facility]  3. Do they need a 30 day or 90 day supply?  90 day   Has apt w/ Dr. Diona Browner on 03/28/2021

## 2021-03-13 ENCOUNTER — Other Ambulatory Visit (HOSPITAL_COMMUNITY): Payer: Medicare HMO

## 2021-03-20 ENCOUNTER — Other Ambulatory Visit: Payer: Self-pay

## 2021-03-20 ENCOUNTER — Ambulatory Visit (HOSPITAL_COMMUNITY)
Admission: RE | Admit: 2021-03-20 | Discharge: 2021-03-20 | Disposition: A | Discharge status: Home / Self Care | Payer: Medicare HMO | Source: Ambulatory Visit | Attending: Cardiology | Admitting: Cardiology

## 2021-03-20 DIAGNOSIS — R0602 Shortness of breath: Secondary | ICD-10-CM | POA: Diagnosis not present

## 2021-03-20 LAB — ECHOCARDIOGRAM COMPLETE
AR max vel: 2.59 cm2
AV Area VTI: 2.57 cm2
AV Area mean vel: 2.7 cm2
AV Mean grad: 3.5 mmHg
AV Peak grad: 7.5 mmHg
Ao pk vel: 1.37 m/s
Area-P 1/2: 5.75 cm2
MV VTI: 2.48 cm2
P 1/2 time: 358 msec
S' Lateral: 3.9 cm

## 2021-03-20 NOTE — Progress Notes (Signed)
*  PRELIMINARY RESULTS* Echocardiogram 2D Echocardiogram has been performed.  Carolyne Fiscal 03/20/2021, 3:57 PM

## 2021-03-27 NOTE — Progress Notes (Deleted)
Cardiology Office Note  Date: 03/27/2021   ID: Wayne Middleton 05-08-1940, MRN [id number]  PCP:  Wayne Morgan, MD  Cardiologist:  Nona Dell, MD Electrophysiologist:  None   No chief complaint on file.   History of Present Illness: Wayne Middleton is an 81 y.o. male last seen in November 2022 by Ms. Ingold NP, I reviewed the note.  Recent follow-up echocardiogram revealed LVEF 50 to 55%, moderate RV enlargement with moderate pulmonary hypertension and estimated RVSP 51 mmHg, biatrial enlargement, mild to moderate aortic regurgitation.  He is on Coumadin with follow-up by Dr. Sudie Bailey.  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     atenolol (TENORMIN) 100 MG tablet Take 100 mg by mouth daily.     cetirizine (ZYRTEC) 10 MG tablet Take 10 mg by mouth daily.     diazepam (VALIUM) 10 MG tablet SMARTSIG:0.5 Tablet(s) By Mouth 1-2 Times Daily     diltiazem (CARDIZEM CD) 240 MG 24 hr capsule Take 1 capsule (240 mg total) by mouth daily. 90 capsule 0   furosemide (LASIX) 20 MG tablet Take 1 tablet (20 mg total) by mouth daily. Take 40 mg daily for 3 days then reduce to 20 mg Daily 93 tablet 3   gabapentin (NEURONTIN) 300 MG capsule Take 300 mg by mouth 3 (three) times  daily.     losartan (COZAAR) 50 MG tablet Take 1 tablet (50 mg total) by mouth daily. 90 tablet 3   methocarbamol (ROBAXIN) 500 MG tablet SMARTSIG:2 Tablet(s) By Mouth 1-4 Times Daily     oxyCODONE (ROXICODONE) 15 MG immediate release tablet Take 15 mg by mouth 3 (three) times daily.     potassium chloride (MICRO-K) 10 MEQ CR capsule Take 10 mEq by mouth daily.      pregabalin (LYRICA) 50 MG capsule Take 50 mg by mouth 3 (three) times daily.     warfarin (COUMADIN) 4 MG tablet Take 4 mg by mouth as directed.     zolpidem (AMBIEN) 10 MG tablet Take 10 mg by mouth at bedtime as needed.       No current facility-administered medications for this visit.   Allergies:  Patient has no known allergies.   Social History: The patient  reports that he has never smoked. He has never used smokeless tobacco. He reports that he does not drink alcohol and does not use drugs.   Family History: The patient's family history includes Heart attack (age of onset: 64) in his father; Lung cancer in his brother; Stroke (age of onset: 32) in his mother.   ROS:  Please see the history of present illness. Otherwise, complete review of systems is positive for {NONE  US:[id number].  All other systems are reviewed and negative.   Physical Exam: VS:  There were no vitals taken for this visit., BMI There is no height or weight on file to calculate BMI.  Wt Readings from Last 3 Encounters:  01/23/21 232 lb (105.2 kg)  12/01/19 222 lb 12.8 oz (101.1 kg)  05/14/19 200 lb (90.7 kg)    General: Patient appears comfortable at rest. HEENT: Conjunctiva and lids normal, oropharynx clear with moist mucosa. Neck: Supple, no elevated JVP or carotid bruits, no thyromegaly. Lungs: Clear to auscultation, nonlabored breathing at rest. Cardiac: Regular rate and rhythm, no S3 or significant systolic murmur, no pericardial rub. Abdomen: Soft, nontender, no hepatomegaly, bowel sounds present, no guarding or rebound. Extremities: No  pitting edema, distal pulses 2+. Skin: Warm and dry. Musculoskeletal: No kyphosis. Neuropsychiatric: Alert and oriented x3, affect grossly appropriate.  ECG:  An ECG dated 01/23/2021 was personally reviewed today and demonstrated:  Atrial fibrillation with rightward axis.  Recent Labwork: 01/23/2021: B Natriuretic Peptide 153.0; BUN 14; Creatinine, Ser 0.88; Potassium 4.5; Sodium 137     Component Value Date/Time   CHOL 113 09/15/2009 0000   TRIG 128 09/15/2009 0000   HDL 35 09/15/2009 0000   CHOLHDL 3.2 09/13/2009 0404   VLDL 26 09/13/2009 0404   LDLCALC 52 09/15/2009 0000    Other Studies Reviewed Today:  Echocardiogram 03/20/2021:  1. Left ventricular ejection fraction, by estimation, is 50 to 55%. The  left ventricle has low normal function. The left ventricle has no regional  wall motion abnormalities. There is moderate concentric left ventricular  hypertrophy. Left ventricular  diastolic parameters are indeterminate.   2. Right ventricular systolic function is low normal. The right  ventricular size is moderately enlarged. There is moderately elevated  pulmonary artery systolic pressure. The estimated right ventricular  systolic pressure is 99991111 mmHg.   3. Left atrial size was moderately dilated.   4. Right atrial size was severely dilated.   5. The mitral valve is degenerative. Trivial mitral valve regurgitation.   6. The aortic valve is tricuspid. There is moderate calcification of the  aortic valve. Aortic valve regurgitation is mild to moderate. Aortic valve  sclerosis/calcification is present, without any evidence of aortic  stenosis. Aortic regurgitation PHT  measures 358 msec. Aortic valve mean gradient measures 3.5 mmHg.   7. The inferior vena cava is dilated in size with >50% respiratory  variability, suggesting right atrial pressure of 8 mmHg.   Assessment and Plan:    Medication Adjustments/Labs and Tests Ordered: Current medicines are reviewed at length  with the patient today.  Concerns regarding medicines are outlined above.   Tests Ordered: No orders of the defined types were placed in this encounter.   Medication Changes: No orders of the defined types were placed in this encounter.   Disposition:  Follow up {follow up:15908}  Signed, Satira Sark, MD, [facility] 03/27/2021 12:37 PM    [facility] [place], [place], [place] [address] Phone: [phone]; Fax: [phone]

## 2021-03-28 ENCOUNTER — Ambulatory Visit: Payer: Medicare HMO | Admitting: Cardiology

## 2021-03-28 DIAGNOSIS — I4821 Permanent atrial fibrillation: Secondary | ICD-10-CM

## 2021-03-28 DIAGNOSIS — Z8679 Personal history of other diseases of the circulatory system: Secondary | ICD-10-CM

## 2021-03-29 ENCOUNTER — Encounter: Payer: Self-pay | Admitting: Cardiology

## 2021-10-13 ENCOUNTER — Emergency Department (HOSPITAL_COMMUNITY)
Admission: EM | Admit: 2021-10-13 | Discharge: 2021-10-13 | Disposition: A | Discharge status: Home / Self Care | Payer: Medicare HMO | Attending: Emergency Medicine | Admitting: Emergency Medicine

## 2021-10-13 ENCOUNTER — Encounter (HOSPITAL_COMMUNITY): Payer: Self-pay | Admitting: *Deleted

## 2021-10-13 ENCOUNTER — Other Ambulatory Visit: Payer: Self-pay

## 2021-10-13 ENCOUNTER — Emergency Department (HOSPITAL_COMMUNITY): Payer: Medicare HMO

## 2021-10-13 DIAGNOSIS — S0990XA Unspecified injury of head, initial encounter: Secondary | ICD-10-CM | POA: Diagnosis present

## 2021-10-13 DIAGNOSIS — Z7901 Long term (current) use of anticoagulants: Secondary | ICD-10-CM | POA: Insufficient documentation

## 2021-10-13 DIAGNOSIS — W01198A Fall on same level from slipping, tripping and stumbling with subsequent striking against other object, initial encounter: Secondary | ICD-10-CM | POA: Diagnosis not present

## 2021-10-13 DIAGNOSIS — W19XXXA Unspecified fall, initial encounter: Secondary | ICD-10-CM

## 2021-10-13 DIAGNOSIS — E119 Type 2 diabetes mellitus without complications: Secondary | ICD-10-CM | POA: Diagnosis not present

## 2021-10-13 LAB — COMPREHENSIVE METABOLIC PANEL
ALT: 14 U/L (ref 0–44)
AST: 18 U/L (ref 15–41)
Albumin: 3.2 g/dL — ABNORMAL LOW (ref 3.5–5.0)
Alkaline Phosphatase: 66 U/L (ref 38–126)
Anion gap: 6 (ref 5–15)
BUN: 15 mg/dL (ref 8–23)
CO2: 28 mmol/L (ref 22–32)
Calcium: 8.5 mg/dL — ABNORMAL LOW (ref 8.9–10.3)
Chloride: 102 mmol/L (ref 98–111)
Creatinine, Ser: 1 mg/dL (ref 0.61–1.24)
GFR, Estimated: >60 mL/min (ref >60)
Glucose, Bld: 158 mg/dL — ABNORMAL HIGH (ref 70–99)
Potassium: 4.1 mmol/L (ref 3.5–5.1)
Sodium: 136 mmol/L (ref 135–145)
Total Bilirubin: 1 mg/dL (ref 0.3–1.2)
Total Protein: 6.1 g/dL — ABNORMAL LOW (ref 6.5–8.1)

## 2021-10-13 LAB — CBC WITH DIFFERENTIAL/PLATELET
Abs Immature Granulocytes: 0.03 10*3/uL (ref 0.00–0.07)
Basophils Absolute: 0 10*3/uL (ref 0.0–0.1)
Basophils Relative: 0 %
Eosinophils Absolute: 0 10*3/uL (ref 0.0–0.5)
Eosinophils Relative: 0 %
HCT: 38.1 % — ABNORMAL LOW (ref 39.0–52.0)
Hemoglobin: 11.7 g/dL — ABNORMAL LOW (ref 13.0–17.0)
Immature Granulocytes: 0 %
Lymphocytes Relative: 8 %
Lymphs Abs: 0.8 10*3/uL (ref 0.7–4.0)
MCH: 24.9 pg — ABNORMAL LOW (ref 26.0–34.0)
MCHC: 30.7 g/dL (ref 30.0–36.0)
MCV: 81.1 fL (ref 80.0–100.0)
Monocytes Absolute: 0.7 10*3/uL (ref 0.1–1.0)
Monocytes Relative: 8 %
Neutro Abs: 8 10*3/uL — ABNORMAL HIGH (ref 1.7–7.7)
Neutrophils Relative %: 84 %
Platelets: 178 10*3/uL (ref 150–400)
RBC: 4.7 MIL/uL (ref 4.22–5.81)
RDW: 15.7 % — ABNORMAL HIGH (ref 11.5–15.5)
WBC: 9.6 10*3/uL (ref 4.0–10.5)
nRBC: 0 % (ref 0.0–0.2)

## 2021-10-13 LAB — TROPONIN I (HIGH SENSITIVITY)
Troponin I (High Sensitivity): 9 ng/L (ref <18)
Troponin I (High Sensitivity): 8 ng/L (ref <18)

## 2021-10-13 LAB — PROTIME-INR
INR: 3.4 — ABNORMAL HIGH (ref 0.8–1.2)
Prothrombin Time: 34.3 seconds — ABNORMAL HIGH (ref 11.4–15.2)

## 2021-10-13 MED ORDER — SODIUM CHLORIDE 0.9 % IV BOLUS
500.0000 mL | Freq: Once | INTRAVENOUS | Status: AC
  Administered 2021-10-13: 500 mL via INTRAVENOUS

## 2021-10-13 MED ORDER — OXYCODONE-ACETAMINOPHEN 5-325 MG PO TABS
1.0000 | ORAL_TABLET | Freq: Once | ORAL | Status: AC
  Administered 2021-10-13: 1 via ORAL
  Filled 2021-10-13: qty 1

## 2021-10-13 NOTE — Discharge Instructions (Signed)
Do not take your Coumadin today.  Call your doctor on Monday to find out how to adjust your Coumadin level.  Make sure you use a walker to ambulate with.  Follow-up with your doctor for recheck within 1 to 2 weeks

## 2021-10-13 NOTE — ED Provider Notes (Signed)
[facility] EMERGENCY DEPARTMENT Provider Note   CSN: [id number] Arrival date & time: 10/13/21  1133     History  Chief Complaint  Patient presents with   Wayne Middleton is a 81 y.o. male.  Patient has a history of atrial fibs and diabetes and cardiomyopathy.  Patient slid down on the floor and hit his head.  He usually walks slowly.  The history is provided by the patient, a relative and medical records. No language interpreter was used.  Fall This is a new problem. The current episode started 6 to 12 hours ago. The problem occurs rarely. The problem has been resolved. Pertinent negatives include no chest pain, no abdominal pain and no headaches. Nothing aggravates the symptoms. Nothing relieves the symptoms. He has tried nothing for the symptoms. The treatment provided no relief.       Home Medications Prior to Admission medications   Medication Sig Start Date End Date Taking? Authorizing Provider  allopurinol (ZYLOPRIM) 300 MG tablet Take 300 mg by mouth daily. 07/24/21  Yes [provider]  atenolol (TENORMIN) 100 MG tablet Take 100 mg by mouth daily. 08/14/20  Yes [provider]  cetirizine (ZYRTEC) 10 MG tablet Take 10 mg by mouth daily. 12/20/20  Yes [provider]  diltiazem (CARDIZEM CD) 240 MG 24 hr capsule Take 1 capsule (240 mg total) by mouth daily. 02/15/21  Yes Satira Sark, MD  FLUoxetine (PROZAC) 20 MG capsule Take 20 mg by mouth daily. 08/28/21  Yes [provider]  furosemide (LASIX) 20 MG tablet Take 1 tablet (20 mg total) by mouth daily. Take 40 mg daily for 3 days then reduce to 20 mg Daily Patient taking differently: Take 20 mg by mouth daily as needed for fluid or edema. 01/29/21  Yes Isaiah Serge, NP  losartan (COZAAR) 50 MG tablet Take 1 tablet (50 mg total) by mouth daily. 01/23/21  Yes Isaiah Serge, NP  methocarbamol (ROBAXIN) 500 MG tablet Take 500 mg by mouth 2 (two) times daily as needed for muscle  spasms. 11/15/20  Yes [provider]  oxycodone (ROXICODONE) 30 MG immediate release tablet Take 15 mg by mouth 3 (three) times daily as needed for pain. 09/18/21  Yes [provider]  potassium chloride (MICRO-K) 10 MEQ CR capsule Take 10 mEq by mouth daily.  04/14/19  Yes [provider]  pregabalin (LYRICA) 300 MG capsule Take 300 mg by mouth 4 (four) times daily. 09/29/21  Yes [provider]  warfarin (COUMADIN) 5 MG tablet Take 5 mg by mouth daily. 09/20/21  Yes [provider]  zolpidem (AMBIEN) 10 MG tablet Take 10 mg by mouth at bedtime as needed for sleep.   Yes [provider]      Allergies    Patient has no known allergies.    Review of Systems   Review of Systems  Constitutional:  Negative for appetite change and fatigue.  HENT:  Negative for congestion, ear discharge and sinus pressure.   Eyes:  Negative for discharge.  Respiratory:  Negative for cough.   Cardiovascular:  Negative for chest pain.  Gastrointestinal:  Negative for abdominal pain and diarrhea.  Genitourinary:  Negative for frequency and hematuria.  Musculoskeletal:  Negative for back pain.  Skin:  Negative for rash.  Neurological:  Negative for seizures and headaches.  Psychiatric/Behavioral:  Negative for hallucinations.     Physical Exam Updated Vital Signs BP (!) 146/87   Pulse  81   Temp 98.4 F (36.9 C) (Oral)   Resp 18   Ht 6' (1.829 m)   Wt 117.9 kg   SpO2 94%   BMI 35.26 kg/m  Physical Exam Vitals and nursing note reviewed.  Constitutional:      Appearance: He is well-developed.  HENT:     Head: Normocephalic.     Comments: Abrasion to forehead    Right Ear: Tympanic membrane normal.     Left Ear: Tympanic membrane normal.     Nose: Nose normal.  Eyes:     General: No scleral icterus.    Conjunctiva/sclera: Conjunctivae normal.  Neck:     Thyroid: No thyromegaly.  Cardiovascular:     Rate and Rhythm: Normal rate and regular  rhythm.     Heart sounds: No murmur heard.    No friction rub. No gallop.  Pulmonary:     Breath sounds: No stridor. No wheezing or rales.  Chest:     Chest wall: No tenderness.  Abdominal:     General: There is no distension.     Tenderness: There is no abdominal tenderness. There is no rebound.  Musculoskeletal:        General: Normal range of motion.     Cervical back: Neck supple.     Comments: Patient ambulates slowly but is able to get around without falling.  His son states that this is how he normally works  Lymphadenopathy:     Cervical: No cervical adenopathy.  Skin:    Findings: No erythema or rash.  Neurological:     Mental Status: He is alert and oriented to person, place, and time.     Motor: No abnormal muscle tone.     Coordination: Coordination normal.  Psychiatric:        Behavior: Behavior normal.     ED Results / Procedures / Treatments   Labs (all labs ordered are listed, but only abnormal results are displayed) Labs Reviewed  CBC WITH DIFFERENTIAL/PLATELET - Abnormal; Notable for the following components:      Result Value   Hemoglobin 11.7 (*)    HCT 38.1 (*)    MCH 24.9 (*)    RDW 15.7 (*)    Neutro Abs 8.0 (*)    All other components within normal limits  COMPREHENSIVE METABOLIC PANEL - Abnormal; Notable for the following components:   Glucose, Bld 158 (*)    Calcium 8.5 (*)    Total Protein 6.1 (*)    Albumin 3.2 (*)    All other components within normal limits  PROTIME-INR - Abnormal; Notable for the following components:   Prothrombin Time 34.3 (*)    INR 3.4 (*)    All other components within normal limits  TROPONIN I (HIGH SENSITIVITY)  TROPONIN I (HIGH SENSITIVITY)    EKG None  Radiology CT Head Wo Contrast  Result Date: 10/13/2021 CLINICAL DATA:  Ataxia, cervical trauma; Head trauma, intracranial arterial injury suspected EXAM: CT HEAD WITHOUT CONTRAST CT CERVICAL SPINE WITHOUT CONTRAST TECHNIQUE: Multidetector CT imaging of  the head and cervical spine was performed following the standard protocol without intravenous contrast. Multiplanar CT image reconstructions of the cervical spine were also generated. RADIATION DOSE REDUCTION: This exam was performed according to the departmental dose-optimization program which includes automated exposure control, adjustment of the mA and/or kV according to patient size and/or use of iterative reconstruction technique. COMPARISON:  None Available. FINDINGS: CT HEAD FINDINGS Brain: No evidence of acute infarction, hemorrhage, hydrocephalus, extra-axial  collection. High density dural based extra-axial smooth masslike area along the RIGHT posterior parietal vertex with associated flaky calcifications is most consistent with a meningioma. This spans 6 by 14 mm (series 2, image 24). Vascular: Vascular calcifications. Skull: No acute fracture. Sinuses/Orbits: No acute finding. Other: Soft tissue air along the LEFT frontal scalp with focal soft tissue irregularity consistent with laceration. CT CERVICAL SPINE FINDINGS Alignment: 2 mm of anterolisthesis at C7-T1, within normal limits. Minimal retrolisthesis of C2-3, likely degenerative. Skull base and vertebrae: No acute fracture. No primary bone lesion or focal pathologic process. Soft tissues and spinal canal: No prevertebral fluid or swelling. No visible canal hematoma. Disc levels: Severe intervertebral disc space height loss at C3-4, C5-6 and C6-7. Multilevel uncovertebral hypertrophy results in osseous neuroforaminal narrowing most pronounced at bilateral C3-4, RIGHT C4-5, RIGHT C5-6 and LEFT greater than RIGHT C6-7. There is likely moderate canal narrowing at C5-6 secondary to a posterior disc osteophyte complex. Upper chest: Negative. Other: None IMPRESSION: 1. No acute intracranial abnormality. 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 2. Soft tissue injury of the LEFT frontal scalp. 3. No acute fracture of the  cervical spine. Electronically Signed   By: Valentino Saxon M.D.   On: 10/13/2021 12:59   CT Cervical Spine Wo Contrast  Result Date: 10/13/2021 CLINICAL DATA:  Ataxia, cervical trauma; Head trauma, intracranial arterial injury suspected EXAM: CT HEAD WITHOUT CONTRAST CT CERVICAL SPINE WITHOUT CONTRAST TECHNIQUE: Multidetector CT imaging of the head and cervical spine was performed following the standard protocol without intravenous contrast. Multiplanar CT image reconstructions of the cervical spine were also generated. RADIATION DOSE REDUCTION: This exam was performed according to the departmental dose-optimization program which includes automated exposure control, adjustment of the mA and/or kV according to patient size and/or use of iterative reconstruction technique. COMPARISON:  None Available. FINDINGS: CT HEAD FINDINGS Brain: No evidence of acute infarction, hemorrhage, hydrocephalus, extra-axial collection. High density dural based extra-axial smooth masslike area along the RIGHT posterior parietal vertex with associated flaky calcifications is most consistent with a meningioma. This spans 6 by 14 mm (series 2, image 24). Vascular: Vascular calcifications. Skull: No acute fracture. Sinuses/Orbits: No acute finding. Other: Soft tissue air along the LEFT frontal scalp with focal soft tissue irregularity consistent with laceration. CT CERVICAL SPINE FINDINGS Alignment: 2 mm of anterolisthesis at C7-T1, within normal limits. Minimal retrolisthesis of C2-3, likely degenerative. Skull base and vertebrae: No acute fracture. No primary bone lesion or focal pathologic process. Soft tissues and spinal canal: No prevertebral fluid or swelling. No visible canal hematoma. Disc levels: Severe intervertebral disc space height loss at C3-4, C5-6 and C6-7. Multilevel uncovertebral hypertrophy results in osseous neuroforaminal narrowing most pronounced at bilateral C3-4, RIGHT C4-5, RIGHT C5-6 and LEFT greater than  RIGHT C6-7. There is likely moderate canal narrowing at C5-6 secondary to a posterior disc osteophyte complex. Upper chest: Negative. Other: None IMPRESSION: 1. No acute intracranial abnormality. 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 2. Soft tissue injury of the LEFT frontal scalp. 3. No acute fracture of the cervical spine. Electronically Signed   By: Valentino Saxon M.D.   On: 10/13/2021 12:59   DG Chest Port 1 View  Result Date: 10/13/2021 CLINICAL DATA:  Patient fell while going to the bathroom. EXAM: PORTABLE CHEST 1 VIEW COMPARISON:  January 23, 2021 FINDINGS: Stable cardiomegaly. The heart, hila, mediastinum, lungs, and pleura are otherwise unchanged and unremarkable. No pneumothorax. IMPRESSION: No active disease.  Electronically Signed   By: Gerome Sam III M.D.   On: 10/13/2021 12:33    Procedures Procedures    Medications Ordered in ED Medications  oxyCODONE-acetaminophen (PERCOCET/ROXICET) 5-325 MG per tablet 1 tablet (has no administration in time range)  sodium chloride 0.9 % bolus 500 mL (0 mLs Intravenous Stopped 10/13/21 1338)    ED Course/ Medical Decision Making/ A&P                           Medical Decision Making Amount and/or Complexity of Data Reviewed Labs: ordered. Radiology: ordered.  Risk Prescription drug management.  This patient presents to the ED for concern of fall, this involves an extensive number of treatment options, and is a complaint that carries with it a high risk of complications and morbidity.  The differential diagnosis includes subdural   Co morbidities that complicate the patient evaluation   Diabetes and cardiomyopathy  Additional history obtained:  Additional history obtained from family External records from outside source obtained and reviewed including hospital records   Lab Tests:  I Ordered, and personally interpreted labs.  The pertinent results include: Hemoglobin 11.7 glucose 158,  troponin is normal, INR 3.4   Imaging Studies ordered:  I ordered imaging studies including CT head and cervical spine I independently visualized and interpreted imaging which showed negative I agree with the radiologist interpretation   Cardiac Monitoring: / EKG:  The patient was maintained on a cardiac monitor.  I personally viewed and interpreted the cardiac monitored which showed an underlying rhythm of: Normal sinus rhythm   Consultations Obtained:  No consultant  Problem List / ED Course / Critical interventions / Medication management  Atrial fibs fall Percocet for pain Reevaluation of the patient after these medicines showed that the patient improved I have reviewed the patients home medicines and have made adjustments as needed   Social Determinants of Health:  Lives by himself but children check on him frequently   Test / Admission - Considered:  No additional test needed  Patient with a fall and head injury.  CT scans unremarkable.  He is ambulating slowly but according to his family like he normally does and his mentation is normal.  He will be discharged home and told to use a walker to ambulate with and he will hold his Coumadin today and call his doctor Monday about Coumadin level        Final Clinical Impression(s) / ED Diagnoses Final diagnoses:  Fall, initial encounter  Injury of head, initial encounter    Rx / DC Orders ED Discharge Orders     None         Bethann Berkshire, MD 10/13/21 1821

## 2021-10-13 NOTE — ED Triage Notes (Signed)
Reported that pt fell while going to [facility] and fell, reported that pt does not know what happened. Call for [facility] due to a fall. Pt lives at home alone. Reported pt with unstable gait. Cb 175.  Noted blood to bridge of nose and pt is on Coumadin, hx of afib. Denies any pain.  Pt able to state correct place, but not age or year.

## 2022-02-15 ENCOUNTER — Other Ambulatory Visit (HOSPITAL_COMMUNITY): Payer: Self-pay | Admitting: Nurse Practitioner

## 2022-02-15 DIAGNOSIS — R42 Dizziness and giddiness: Secondary | ICD-10-CM

## 2022-02-15 DIAGNOSIS — W19XXXA Unspecified fall, initial encounter: Secondary | ICD-10-CM

## 2022-04-09 ENCOUNTER — Emergency Department (HOSPITAL_COMMUNITY): Payer: Medicare HMO

## 2022-04-09 ENCOUNTER — Other Ambulatory Visit: Payer: Self-pay

## 2022-04-09 ENCOUNTER — Encounter (HOSPITAL_COMMUNITY): Payer: Self-pay | Admitting: Emergency Medicine

## 2022-04-09 ENCOUNTER — Inpatient Hospital Stay (HOSPITAL_COMMUNITY)
Admission: EM | Admit: 2022-04-09 | Discharge: 2022-04-12 | DRG: 193 | Disposition: A | Discharge status: Home Health | Payer: Medicare HMO | Attending: Family Medicine | Admitting: Family Medicine

## 2022-04-09 DIAGNOSIS — I5032 Chronic diastolic (congestive) heart failure: Secondary | ICD-10-CM | POA: Insufficient documentation

## 2022-04-09 DIAGNOSIS — Z823 Family history of stroke: Secondary | ICD-10-CM

## 2022-04-09 DIAGNOSIS — E118 Type 2 diabetes mellitus with unspecified complications: Secondary | ICD-10-CM

## 2022-04-09 DIAGNOSIS — I482 Chronic atrial fibrillation, unspecified: Secondary | ICD-10-CM | POA: Diagnosis present

## 2022-04-09 DIAGNOSIS — Z7901 Long term (current) use of anticoagulants: Secondary | ICD-10-CM

## 2022-04-09 DIAGNOSIS — E860 Dehydration: Secondary | ICD-10-CM | POA: Diagnosis present

## 2022-04-09 DIAGNOSIS — E1142 Type 2 diabetes mellitus with diabetic polyneuropathy: Secondary | ICD-10-CM | POA: Diagnosis present

## 2022-04-09 DIAGNOSIS — I429 Cardiomyopathy, unspecified: Secondary | ICD-10-CM | POA: Diagnosis present

## 2022-04-09 DIAGNOSIS — Z79899 Other long term (current) drug therapy: Secondary | ICD-10-CM

## 2022-04-09 DIAGNOSIS — R0602 Shortness of breath: Secondary | ICD-10-CM | POA: Diagnosis not present

## 2022-04-09 DIAGNOSIS — E1165 Type 2 diabetes mellitus with hyperglycemia: Secondary | ICD-10-CM | POA: Diagnosis present

## 2022-04-09 DIAGNOSIS — J189 Pneumonia, unspecified organism: Principal | ICD-10-CM | POA: Insufficient documentation

## 2022-04-09 DIAGNOSIS — N179 Acute kidney failure, unspecified: Secondary | ICD-10-CM | POA: Diagnosis not present

## 2022-04-09 DIAGNOSIS — Z801 Family history of malignant neoplasm of trachea, bronchus and lung: Secondary | ICD-10-CM

## 2022-04-09 DIAGNOSIS — I6501 Occlusion and stenosis of right vertebral artery: Secondary | ICD-10-CM | POA: Diagnosis present

## 2022-04-09 DIAGNOSIS — R0603 Acute respiratory distress: Secondary | ICD-10-CM | POA: Diagnosis present

## 2022-04-09 DIAGNOSIS — G8929 Other chronic pain: Secondary | ICD-10-CM | POA: Diagnosis present

## 2022-04-09 DIAGNOSIS — J9600 Acute respiratory failure, unspecified whether with hypoxia or hypercapnia: Secondary | ICD-10-CM | POA: Insufficient documentation

## 2022-04-09 DIAGNOSIS — T402X5A Adverse effect of other opioids, initial encounter: Secondary | ICD-10-CM | POA: Diagnosis present

## 2022-04-09 DIAGNOSIS — J9601 Acute respiratory failure with hypoxia: Secondary | ICD-10-CM | POA: Diagnosis not present

## 2022-04-09 DIAGNOSIS — J690 Pneumonitis due to inhalation of food and vomit: Secondary | ICD-10-CM

## 2022-04-09 DIAGNOSIS — G928 Other toxic encephalopathy: Secondary | ICD-10-CM | POA: Diagnosis present

## 2022-04-09 DIAGNOSIS — F32A Depression, unspecified: Secondary | ICD-10-CM | POA: Diagnosis present

## 2022-04-09 DIAGNOSIS — R4182 Altered mental status, unspecified: Secondary | ICD-10-CM

## 2022-04-09 DIAGNOSIS — R410 Disorientation, unspecified: Secondary | ICD-10-CM

## 2022-04-09 DIAGNOSIS — M109 Gout, unspecified: Secondary | ICD-10-CM | POA: Diagnosis present

## 2022-04-09 DIAGNOSIS — I110 Hypertensive heart disease with heart failure: Secondary | ICD-10-CM | POA: Diagnosis present

## 2022-04-09 DIAGNOSIS — T428X5A Adverse effect of antiparkinsonism drugs and other central muscle-tone depressants, initial encounter: Secondary | ICD-10-CM | POA: Diagnosis present

## 2022-04-09 DIAGNOSIS — F419 Anxiety disorder, unspecified: Secondary | ICD-10-CM | POA: Diagnosis present

## 2022-04-09 DIAGNOSIS — R079 Chest pain, unspecified: Secondary | ICD-10-CM

## 2022-04-09 DIAGNOSIS — Z8249 Family history of ischemic heart disease and other diseases of the circulatory system: Secondary | ICD-10-CM

## 2022-04-09 DIAGNOSIS — I10 Essential (primary) hypertension: Secondary | ICD-10-CM | POA: Diagnosis present

## 2022-04-09 HISTORY — DX: Acute respiratory failure, unspecified whether with hypoxia or hypercapnia: J96.00

## 2022-04-09 HISTORY — DX: Pneumonia, unspecified organism: J18.9

## 2022-04-09 LAB — BASIC METABOLIC PANEL
Anion gap: 10 (ref 5–15)
BUN: 28 mg/dL — ABNORMAL HIGH (ref 8–23)
CO2: 27 mmol/L (ref 22–32)
Calcium: 8.7 mg/dL — ABNORMAL LOW (ref 8.9–10.3)
Chloride: 96 mmol/L — ABNORMAL LOW (ref 98–111)
Creatinine, Ser: 1.39 mg/dL — ABNORMAL HIGH (ref 0.61–1.24)
GFR, Estimated: 51 mL/min — ABNORMAL LOW (ref >60)
Glucose, Bld: 134 mg/dL — ABNORMAL HIGH (ref 70–99)
Potassium: 3.7 mmol/L (ref 3.5–5.1)
Sodium: 133 mmol/L — ABNORMAL LOW (ref 135–145)

## 2022-04-09 LAB — CBC
HCT: 44.9 % (ref 39.0–52.0)
Hemoglobin: 13.4 g/dL (ref 13.0–17.0)
MCH: 22.9 pg — ABNORMAL LOW (ref 26.0–34.0)
MCHC: 29.8 g/dL — ABNORMAL LOW (ref 30.0–36.0)
MCV: 76.8 fL — ABNORMAL LOW (ref 80.0–100.0)
Platelets: 237 10*3/uL (ref 150–400)
RBC: 5.85 MIL/uL — ABNORMAL HIGH (ref 4.22–5.81)
RDW: 18 % — ABNORMAL HIGH (ref 11.5–15.5)
WBC: 11.2 10*3/uL — ABNORMAL HIGH (ref 4.0–10.5)
nRBC: 0 % (ref 0.0–0.2)

## 2022-04-09 LAB — HEPATIC FUNCTION PANEL
ALT: 21 U/L (ref 0–44)
AST: 23 U/L (ref 15–41)
Albumin: 3.5 g/dL (ref 3.5–5.0)
Alkaline Phosphatase: 123 U/L (ref 38–126)
Bilirubin, Direct: 0.4 mg/dL — ABNORMAL HIGH (ref 0.0–0.2)
Indirect Bilirubin: 0.6 mg/dL (ref 0.3–0.9)
Total Bilirubin: 1 mg/dL (ref 0.3–1.2)
Total Protein: 7.3 g/dL (ref 6.5–8.1)

## 2022-04-09 LAB — PROTIME-INR
INR: 1.8 — ABNORMAL HIGH (ref 0.8–1.2)
Prothrombin Time: 20.4 seconds — ABNORMAL HIGH (ref 11.4–15.2)

## 2022-04-09 LAB — LIPASE, BLOOD: Lipase: 38 U/L (ref 11–51)

## 2022-04-09 LAB — TROPONIN I (HIGH SENSITIVITY)
Troponin I (High Sensitivity): 8 ng/L (ref <18)
Troponin I (High Sensitivity): 10 ng/L (ref <18)

## 2022-04-09 MED ORDER — ONDANSETRON HCL 4 MG PO TABS: 4.0000 mg | ORAL_TABLET | Freq: Four times a day (QID) | ORAL | Status: DC | PRN

## 2022-04-09 MED ORDER — DILTIAZEM HCL 25 MG/5ML IV SOLN: 10.0000 mg | Freq: Once | INTRAVENOUS | Status: DC

## 2022-04-09 MED ORDER — INSULIN ASPART 100 UNIT/ML IJ SOLN
0.0000 [IU] | Freq: Three times a day (TID) | INTRAMUSCULAR | Status: DC
  Administered 2022-04-10: 2 [IU] via SUBCUTANEOUS
  Administered 2022-04-10 – 2022-04-11 (×2): 1 [IU] via SUBCUTANEOUS

## 2022-04-09 MED ORDER — WARFARIN SODIUM 7.5 MG PO TABS
7.5000 mg | ORAL_TABLET | Freq: Once | ORAL | Status: AC
  Administered 2022-04-09: 7.5 mg via ORAL
  Filled 2022-04-09: qty 1

## 2022-04-09 MED ORDER — OXYCODONE HCL 5 MG PO TABS
15.0000 mg | ORAL_TABLET | Freq: Three times a day (TID) | ORAL | Status: DC | PRN
  Administered 2022-04-10 – 2022-04-12 (×7): 15 mg via ORAL
  Filled 2022-04-09 (×7): qty 3

## 2022-04-09 MED ORDER — PREGABALIN 75 MG PO CAPS
300.0000 mg | ORAL_CAPSULE | Freq: Four times a day (QID) | ORAL | Status: DC
  Administered 2022-04-10 – 2022-04-12 (×9): 300 mg via ORAL
  Filled 2022-04-09 (×9): qty 4

## 2022-04-09 MED ORDER — SODIUM CHLORIDE 0.9 % IV SOLN
500.0000 mg | INTRAVENOUS | Status: DC
  Administered 2022-04-09 – 2022-04-11 (×3): 500 mg via INTRAVENOUS
  Filled 2022-04-09 (×3): qty 5

## 2022-04-09 MED ORDER — DIAZEPAM 5 MG PO TABS
5.0000 mg | ORAL_TABLET | Freq: Two times a day (BID) | ORAL | Status: DC | PRN
  Administered 2022-04-10 – 2022-04-12 (×4): 5 mg via ORAL
  Filled 2022-04-09 (×4): qty 1

## 2022-04-09 MED ORDER — DILTIAZEM HCL ER COATED BEADS 120 MG PO CP24
240.0000 mg | ORAL_CAPSULE | Freq: Every day | ORAL | Status: DC
  Administered 2022-04-10 – 2022-04-12 (×3): 240 mg via ORAL
  Filled 2022-04-09 (×3): qty 2

## 2022-04-09 MED ORDER — WARFARIN - PHARMACIST DOSING INPATIENT: Freq: Every day | Status: DC

## 2022-04-09 MED ORDER — DILTIAZEM HCL 25 MG/5ML IV SOLN: 5.0000 mg | Freq: Once | INTRAVENOUS | Status: DC

## 2022-04-09 MED ORDER — ATENOLOL 25 MG PO TABS
100.0000 mg | ORAL_TABLET | Freq: Two times a day (BID) | ORAL | Status: DC
  Administered 2022-04-10 – 2022-04-12 (×5): 100 mg via ORAL
  Filled 2022-04-09 (×5): qty 4

## 2022-04-09 MED ORDER — LACTATED RINGERS IV BOLUS
500.0000 mL | Freq: Once | INTRAVENOUS | Status: AC
  Administered 2022-04-09: 500 mL via INTRAVENOUS

## 2022-04-09 MED ORDER — SODIUM CHLORIDE 0.9 % IV SOLN
2.0000 g | INTRAVENOUS | Status: DC
  Administered 2022-04-09 – 2022-04-11 (×3): 2 g via INTRAVENOUS
  Filled 2022-04-09 (×3): qty 20

## 2022-04-09 MED ORDER — ONDANSETRON HCL 4 MG/2ML IJ SOLN: 4.0000 mg | Freq: Four times a day (QID) | INTRAMUSCULAR | Status: DC | PRN

## 2022-04-09 MED ORDER — FLUOXETINE HCL 20 MG PO CAPS
20.0000 mg | ORAL_CAPSULE | Freq: Every day | ORAL | Status: DC
  Administered 2022-04-10 – 2022-04-12 (×3): 20 mg via ORAL
  Filled 2022-04-09 (×3): qty 1

## 2022-04-09 MED ORDER — HYDRALAZINE HCL 20 MG/ML IJ SOLN: 5.0000 mg | INTRAMUSCULAR | Status: DC | PRN

## 2022-04-09 MED ORDER — IBUPROFEN 600 MG PO TABS
600.0000 mg | ORAL_TABLET | Freq: Four times a day (QID) | ORAL | Status: DC | PRN
  Administered 2022-04-09: 600 mg via ORAL
  Filled 2022-04-09: qty 1

## 2022-04-09 MED ORDER — SODIUM CHLORIDE 0.9 % IV SOLN: INTRAVENOUS | Status: DC

## 2022-04-09 MED ORDER — SODIUM CHLORIDE 0.9 % IV SOLN
3.0000 g | Freq: Once | INTRAVENOUS | Status: AC
  Administered 2022-04-09: 3 g via INTRAVENOUS
  Filled 2022-04-09: qty 8

## 2022-04-09 MED ORDER — FUROSEMIDE 20 MG PO TABS: 20.0000 mg | ORAL_TABLET | Freq: Every day | ORAL | Status: DC | PRN

## 2022-04-09 MED ORDER — ALLOPURINOL 100 MG PO TABS
300.0000 mg | ORAL_TABLET | Freq: Every day | ORAL | Status: DC
  Administered 2022-04-10 – 2022-04-12 (×3): 300 mg via ORAL
  Filled 2022-04-09 (×3): qty 3

## 2022-04-09 NOTE — ED Provider Notes (Signed)
[place] [facility]ERGENCY DEPARTMENT AT [facility] Provider Note   CSN: [id number] Arrival date & time: 04/09/22  1318     History  Chief Complaint  Patient presents with   Chest Pain    Wayne Middleton is a 82 y.o. male.   Chest Pain Patient presents for chest pain.  Medical history includes atrial fibrillation, HTN, cardiomyopathy, anxiety, DM, prior GIB.  On Sunday, he had a fall.  He denies striking his head.  Since that time, he has had a left-sided chest pain that radiates to left arm.  His son, accompanies him in the ED, states that he has seemed confused.  Son is noted increased heart rate over the past 3 days in the range of 115.  Patient lives independently and does manage his own medications.  Patient is unable to say if he has been taking his medications or not.  Patient states that he took a pain pill prior to arrival.  He denies any current pain.     Home Medications Prior to Admission medications   Medication Sig Start Date End Date Taking? Authorizing Provider  allopurinol (ZYLOPRIM) 300 MG tablet Take 300 mg by mouth daily. 07/24/21   [provider]  atenolol (TENORMIN) 100 MG tablet Take 100 mg by mouth daily. 08/14/20   [provider]  cetirizine (ZYRTEC) 10 MG tablet Take 10 mg by mouth daily. 12/20/20   [provider]  diltiazem (CARDIZEM CD) 240 MG 24 hr capsule Take 1 capsule (240 mg total) by mouth daily. 02/15/21   Jonelle Sidle, MD  FLUoxetine (PROZAC) 20 MG capsule Take 20 mg by mouth daily. 08/28/21   [provider]  furosemide (LASIX) 20 MG tablet Take 1 tablet (20 mg total) by mouth daily. Take 40 mg daily for 3 days then reduce to 20 mg Daily Patient taking differently: Take 20 mg by mouth daily as needed for fluid or edema. 01/29/21   Leone Brand, NP  losartan (COZAAR) 50 MG tablet Take 1 tablet (50 mg total) by mouth daily. 01/23/21   Leone Brand, NP  methocarbamol (ROBAXIN) 500 MG tablet Take 500  mg by mouth 2 (two) times daily as needed for muscle spasms. 11/15/20   [provider]  oxycodone (ROXICODONE) 30 MG immediate release tablet Take 15 mg by mouth 3 (three) times daily as needed for pain. 09/18/21   [provider]  potassium chloride (MICRO-K) 10 MEQ CR capsule Take 10 mEq by mouth daily.  04/14/19   [provider]  pregabalin (LYRICA) 300 MG capsule Take 300 mg by mouth 4 (four) times daily. 09/29/21   [provider]  warfarin (COUMADIN) 5 MG tablet Take 5 mg by mouth daily. 09/20/21   [provider]  zolpidem (AMBIEN) 10 MG tablet Take 10 mg by mouth at bedtime as needed for sleep.    [provider]      Allergies    Patient has no known allergies.    Review of Systems   Review of Systems  Unable to perform ROS: Mental status change  Cardiovascular:  Positive for chest pain.  Psychiatric/Behavioral:  Positive for confusion.     Physical Exam Updated Vital Signs BP 127/82   Pulse 96   Temp 97.8 F (36.6 C) (Oral)   Resp 18   Ht 6' (1.829 m)   Wt 104.3 kg   SpO2 91%   BMI 31.19 kg/m  Physical Exam Vitals and nursing note reviewed.  Constitutional:      General: He is not in acute distress.    Appearance: He is well-developed. He is not toxic-appearing or diaphoretic.  HENT:     Head: Normocephalic and atraumatic.  Eyes:     Conjunctiva/sclera: Conjunctivae normal.  Cardiovascular:     Rate and Rhythm: Tachycardia present. Rhythm irregular.     Heart sounds: No murmur heard. Pulmonary:     Effort: Pulmonary effort is normal. No tachypnea or respiratory distress.     Breath sounds: Normal breath sounds. No decreased breath sounds, wheezing, rhonchi or rales.  Chest:     Chest wall: No tenderness.  Abdominal:     Palpations: Abdomen is soft.     Tenderness: There is no abdominal tenderness.  Musculoskeletal:        General: No swelling.     Cervical back: Normal range of motion and neck supple.      Right lower leg: No edema.     Left lower leg: No edema.  Skin:    General: Skin is warm and dry.     Coloration: Skin is not cyanotic or pale.  Neurological:     General: No focal deficit present.     Mental Status: He is alert and oriented to person, place, and time.  Psychiatric:        Mood and Affect: Mood normal.        Behavior: Behavior normal.     ED Results / Procedures / Treatments   Labs (all labs ordered are listed, but only abnormal results are displayed) Labs Reviewed  BASIC METABOLIC PANEL - Abnormal; Notable for the following components:      Result Value   Sodium 133 (*)    Chloride 96 (*)    Glucose, Bld 134 (*)    BUN 28 (*)    Creatinine, Ser 1.39 (*)    Calcium 8.7 (*)    GFR, Estimated 51 (*)    All other components within normal limits  CBC - Abnormal; Notable for the following components:   WBC 11.2 (*)    RBC 5.85 (*)    MCV 76.8 (*)    MCH 22.9 (*)    MCHC 29.8 (*)    RDW 18.0 (*)    All other components within normal limits  PROTIME-INR - Abnormal; Notable for the following components:   Prothrombin Time 20.4 (*)    INR 1.8 (*)    All other components within normal limits  HEPATIC FUNCTION PANEL - Abnormal; Notable for the following components:   Bilirubin, Direct 0.4 (*)    All other components within normal limits  LIPASE, BLOOD  TROPONIN I (HIGH SENSITIVITY)  TROPONIN I (HIGH SENSITIVITY)    EKG EKG Interpretation  Date/Time:  Tuesday April 09 2022 13:30:51 EST Ventricular Rate:  113 PR Interval:    QRS Duration: 92 QT Interval:  334 QTC Calculation: 458 R Axis:   135 Text Interpretation: Atrial fibrillation with rapid ventricular response Right axis deviation Incomplete right bundle branch block Right ventricular hypertrophy Abnormal ECG Confirmed by Gloris Manchester [phone]) on 04/09/2022 2:50:37 PM  Radiology CT Chest Wo Contrast  Result Date: 04/09/2022 CLINICAL DATA:  Status post fall 3 days ago.  Chest pain. EXAM: CT CHEST  WITHOUT CONTRAST TECHNIQUE: Multidetector CT imaging of the chest was performed following the standard protocol without IV contrast. RADIATION DOSE REDUCTION: This exam was performed according to the departmental dose-optimization program which includes automated exposure control, adjustment of the mA  and/or kV according to patient size and/or use of iterative reconstruction technique. COMPARISON:  08/24/2009 FINDINGS: Cardiovascular: Multi chamber cardiac enlargement identified. No pericardial effusion. Aortic atherosclerosis and 3 vessel coronary artery calcifications. Mediastinum/Nodes: No enlarged mediastinal or axillary lymph nodes. Thyroid gland, trachea, and esophagus demonstrate no significant findings. Lungs/Pleura: No pneumothorax, pleural fluid or signs of pulmonary contusion. There is partial atelectasis of the anterior right middle lobe with right middle lobe volume loss. Diffuse bronchial wall thickening. Mild centrilobular and paraseptal emphysema. Bilateral lower lung zone predominant peripheral interstitial reticulation, ground-glass attenuation identified. Extensive tree-in-bud nodularity is identified throughout the right middle lobe and right lower lobe. Perifissural nodule along the major fissure measures 1.2 x 0.5 cm, image 110/5. Upper Abdomen: No acute abnormality. Musculoskeletal: No chest wall mass or suspicious bone lesions identified. IMPRESSION: 1. No acute cardiopulmonary abnormalities. 2. Extensive tree-in-bud nodularity is identified throughout the right middle lobe and right lower lobe. Findings are favored to represent sequelae of chronic aspiration or infection. 3. Diffuse bronchial wall thickening with emphysema, as above; imaging findings suggestive of underlying COPD. 4. Bilateral lower lung zone predominant peripheral interstitial reticulation, ground-glass attenuation identified. Findings are nonspecific but may reflect underlying interstitial lung disease. 5. Perifissural  nodule along the major fissure measures 1.2 x 0.5 cm. This most likely represents a benign intrapulmonary lymph node. 6. Age-indeterminate right middle lobe atelectasis with volume loss. 7. Aortic Atherosclerosis (ICD10-I70.0) and Emphysema (ICD10-J43.9). Electronically Signed   By: Kerby Moors M.D.   On: 04/09/2022 15:23   CT Head Wo Contrast  Result Date: 04/09/2022 CLINICAL DATA:  Neck Trauma EXAM: CT HEAD WITHOUT CONTRAST CT CERVICAL SPINE WITHOUT CONTRAST CT THORACIC SPINE WITHOUT CONTRAST TECHNIQUE: Multidetector CT imaging of the head and cervical and thoracic spine was performed following the standard protocol without intravenous contrast. Multiplanar CT image reconstructions of the cervical spine were also generated. RADIATION DOSE REDUCTION: This exam was performed according to the departmental dose-optimization program which includes automated exposure control, adjustment of the mA and/or kV according to patient size and/or use of iterative reconstruction technique. COMPARISON:  None Available. FINDINGS: CT HEAD FINDINGS Brain: No evidence of acute infarction, hemorrhage, hydrocephalus, extra-axial collection or mass lesion/mass effect. There is sequela of moderate chronic microvascular ischemic change. 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 Vascular: No hyperdense vessel or unexpected calcification. Skull: Normal. Negative for fracture or focal lesion. Sinuses/Orbits: No mastoid or middle ear effusion. Paranasal sinuses are clear. Orbits are unremarkable. Other: None CT CERVICAL AND THORACIC SPINE FINDINGS Alignment: There is straightening of the normal cervical lordosis. Skull base and vertebrae: No acute fracture. No primary bone lesion or focal pathologic process. Soft tissues and spinal canal: No prevertebral fluid or swelling. No visible canal hematoma. Disc levels:  No evidence of high-grade spinal canal stenosis. Upper chest: Clustered nodularity in the right  lobe right lower lobe, favored to be infectious or inflammatory, see same-day chest for additional findings. Other: None IMPRESSION: 1. No acute intracranial abnormality. 2. No acute fracture or traumatic malalignment of the cervical or thoracic spine. 3. Clustered nodularity in the right lower lobe, favored to be infectious or inflammatory, see same-day chest for additional findings. Electronically Signed   By: Marin Roberts M.D.   On: 04/09/2022 15:18   CT T-SPINE NO CHARGE  Result Date: 04/09/2022 CLINICAL DATA:  Neck Trauma EXAM: CT HEAD WITHOUT CONTRAST CT CERVICAL SPINE WITHOUT CONTRAST CT THORACIC SPINE WITHOUT CONTRAST TECHNIQUE: Multidetector CT imaging of the head and cervical and  thoracic spine was performed following the standard protocol without intravenous contrast. Multiplanar CT image reconstructions of the cervical spine were also generated. RADIATION DOSE REDUCTION: This exam was performed according to the departmental dose-optimization program which includes automated exposure control, adjustment of the mA and/or kV according to patient size and/or use of iterative reconstruction technique. COMPARISON:  None Available. FINDINGS: CT HEAD FINDINGS Brain: No evidence of acute infarction, hemorrhage, hydrocephalus, extra-axial collection or mass lesion/mass effect. There is sequela of moderate chronic microvascular ischemic change. 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 Vascular: No hyperdense vessel or unexpected calcification. Skull: Normal. Negative for fracture or focal lesion. Sinuses/Orbits: No mastoid or middle ear effusion. Paranasal sinuses are clear. Orbits are unremarkable. Other: None CT CERVICAL AND THORACIC SPINE FINDINGS Alignment: There is straightening of the normal cervical lordosis. Skull base and vertebrae: No acute fracture. No primary bone lesion or focal pathologic process. Soft tissues and spinal canal: No prevertebral fluid or swelling. No  visible canal hematoma. Disc levels:  No evidence of high-grade spinal canal stenosis. Upper chest: Clustered nodularity in the right lobe right lower lobe, favored to be infectious or inflammatory, see same-day chest for additional findings. Other: None IMPRESSION: 1. No acute intracranial abnormality. 2. No acute fracture or traumatic malalignment of the cervical or thoracic spine. 3. Clustered nodularity in the right lower lobe, favored to be infectious or inflammatory, see same-day chest for additional findings. Electronically Signed   By: Marin Roberts M.D.   On: 04/09/2022 15:18   CT Cervical Spine Wo Contrast  Result Date: 04/09/2022 CLINICAL DATA:  Neck Trauma EXAM: CT HEAD WITHOUT CONTRAST CT CERVICAL SPINE WITHOUT CONTRAST CT THORACIC SPINE WITHOUT CONTRAST TECHNIQUE: Multidetector CT imaging of the head and cervical and thoracic spine was performed following the standard protocol without intravenous contrast. Multiplanar CT image reconstructions of the cervical spine were also generated. RADIATION DOSE REDUCTION: This exam was performed according to the departmental dose-optimization program which includes automated exposure control, adjustment of the mA and/or kV according to patient size and/or use of iterative reconstruction technique. COMPARISON:  None Available. FINDINGS: CT HEAD FINDINGS Brain: No evidence of acute infarction, hemorrhage, hydrocephalus, extra-axial collection or mass lesion/mass effect. There is sequela of moderate chronic microvascular ischemic change. 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 Vascular: No hyperdense vessel or unexpected calcification. Skull: Normal. Negative for fracture or focal lesion. Sinuses/Orbits: No mastoid or middle ear effusion. Paranasal sinuses are clear. Orbits are unremarkable. Other: None CT CERVICAL AND THORACIC SPINE FINDINGS Alignment: There is straightening of the normal cervical lordosis. Skull base and vertebrae:  No acute fracture. No primary bone lesion or focal pathologic process. Soft tissues and spinal canal: No prevertebral fluid or swelling. No visible canal hematoma. Disc levels:  No evidence of high-grade spinal canal stenosis. Upper chest: Clustered nodularity in the right lobe right lower lobe, favored to be infectious or inflammatory, see same-day chest for additional findings. Other: None IMPRESSION: 1. No acute intracranial abnormality. 2. No acute fracture or traumatic malalignment of the cervical or thoracic spine. 3. Clustered nodularity in the right lower lobe, favored to be infectious or inflammatory, see same-day chest for additional findings. Electronically Signed   By: Marin Roberts M.D.   On: 04/09/2022 15:18    Procedures Procedures    Medications Ordered in ED Medications  diltiazem (CARDIZEM) injection 5 mg (0 mg Intravenous Hold 04/09/22 1430)  Ampicillin-Sulbactam (UNASYN) 3 g in sodium chloride 0.9 % 100 mL  IVPB (has no administration in time range)  azithromycin (ZITHROMAX) 500 mg in sodium chloride 0.9 % 250 mL IVPB (has no administration in time range)  lactated ringers bolus 500 mL (500 mLs Intravenous New Bag/Given 04/09/22 1704)    ED Course/ Medical Decision Making/ A&P                             Medical Decision Making Amount and/or Complexity of Data Reviewed Labs: ordered. Radiology: ordered.  Risk Prescription drug management.   This patient presents to the ED for concern of chest pain, this involves an extensive number of treatment options, and is a complaint that carries with it a high risk of complications and morbidity.  The differential diagnosis includes ACS, traumatic injury, pneumonia, PE   Co morbidities that complicate the patient evaluation  atrial fibrillation, HTN, cardiomyopathy, anxiety, DM, prior GIB   Additional history obtained:  Additional history obtained from patient's son External records from outside source obtained and  reviewed including EMR   Lab Tests:  I Ordered, and personally interpreted labs.  The pertinent results include: Hemoglobin is normal.  There is a mild leukocytosis.  Creatinine is increased from baseline.  Elevated BUN suggest prerenal etiology.  Troponins x 2 are normal.   Imaging Studies ordered:  I ordered imaging studies including CT of head, cervical spine, T-spine, chest; MRI brain I independently visualized and interpreted imaging which showed no acute injuries.  CT of chest shows extensive tree-in-bud nodularity of right middle and lower lobe concerning for chronic aspiration/or infection I agree with the radiologist interpretation   Cardiac Monitoring: / EKG:  The patient was maintained on a cardiac monitor.  I personally viewed and interpreted the cardiac monitored which showed an underlying rhythm of: Atrial fibrillation  Problem List / ED Course / Critical interventions / Medication management  Patient presents to the ED with his son at bedside.  History is provided mostly by his son.  Although patient is alert and oriented at baseline and lives independently, he has seemed to have confusion over the past 3 days.  Currently, patient is disoriented.  He appears somnolent.  He states that he did take a pain pill prior to arrival and son states that he will sometimes get like this after he takes his pain medication.  Son reports that he has been endorsing left-sided chest pain that radiates to left arm over the past 3 days.  This raises concern of ACS.  Chest pain workup to be initiated.  Additionally, he has had elevated heart rate in the range of 115 over that time span.  EKG on arrival shows atrial fibrillation with RVR.  Patient does have a known history of atrial fibrillation and is prescribed diltiazem.  Patient is unable to say if he has been taking his home medications or not.  Dose of Cardizem to be ordered for rate control.  Given his recent fall on warfarin, patient to also  undergo trauma workup.  Although he does not have any focal deficits on exam, patient's recent confusion is concerning for ICH or CVA.  Lab work is notable for AKI, mild leukocytosis.  Fortunately, troponins are normal.  CT of the head was negative for acute findings.  Given his confusion, MRI brain was ordered.  Remaining CT imaging did not show any other evidence of acute injuries.  CT of chest did show extensive tree-in-bud nodularity throughout the right middle and lower lobe.  This is the area of patient's recent pain.  Antibiotics were initiated.  Given concern for possible aspiration, this raises further suspicion of possible subacute stroke.  MRI was pending at time of signout.  Care of patient was signed out to oncoming provider. I ordered medication including IV fluids for AKI; Unasyn and azithromycin for aspiration and/or pneumonia Reevaluation of the patient after these medicines showed that the patient improved I have reviewed the patients home medicines and have made adjustments as needed   Social Determinants of Health:  Lives independently        Final Clinical Impression(s) / ED Diagnoses Final diagnoses:  Chest pain, unspecified type  Confusion    Rx / DC Orders ED Discharge Orders     None         Gloris Manchester, MD 04/09/22 1731

## 2022-04-09 NOTE — ED Notes (Signed)
MD notified re: pts HR, pt in afib with episode of HR 38, HR increased, HR remains upper 90s most consistently. Charge RN aware, pt to be placed in room with cardiac monitoring

## 2022-04-09 NOTE — ED Triage Notes (Signed)
Patient states he has had chest pain x3days. Hx of afib. Takes blood thinners. Pt states he fell on Saturday and pain has been there since. Pain is worse with movement. Pt did not hit his head. No LOC. No acute distress at this time.

## 2022-04-09 NOTE — H&P (Addendum)
History and Physical    Patient: Wayne Middleton KGU:[id number] DOB: 15-Apr-1940 DOA: 04/09/2022 DOS: the patient was seen and examined on 04/09/2022 PCP: Lemmie Evens, MD  Patient coming from: Home  Chief Complaint:  Chief Complaint  Patient presents with   Chest Pain     HPI: Wayne Middleton is a 82 y.o. male with medical history significant of Afib/chronic, DM, HTN, Neuropathy, anxiety.  Presenting w/ 3 day h/o CP. Pain started after sustaining a R chest wall injury from a mechanical fall. At time of fall, No LOC or head injury. Pain was relatively constant since that time w/ increased pain w/ movement. Pt lives by self but is cared for at times by sons. Son noted an increased HR over the last couple of days and that on day of admission he was acting confused. Pt administers his own medications.  Denies fever, HA, palpitations   Review of Systems: As mentioned in the history of present illness. All other systems reviewed and are negative. 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 He has never used smokeless tobacco. He reports that he does not drink alcohol and does not use drugs.  No Known Allergies  Family History  Problem Relation Age of Onset   Stroke Mother 28   Heart attack Father 49   Lung cancer Brother        84 of 5 brothers deceased due to  Carcinoma of the lung    Prior to Admission medications   Medication Sig Start Date End Date Taking? Authorizing Provider  allopurinol (ZYLOPRIM) 300 MG tablet Take 300 mg by mouth daily. 07/24/21  Yes [provider]  atenolol (TENORMIN) 100 MG tablet Take 100 mg by mouth 2 (two) times daily. 08/14/20  Yes [provider]  cetirizine (ZYRTEC) 10 MG tablet Take 10 mg by mouth daily. 12/20/20  Yes [provider]  diltiazem (CARDIZEM CD) 240 MG 24 hr capsule Take 1 capsule (240 mg total) by mouth daily. 02/15/21  Yes Satira Sark, MD  diphenoxylate-atropine (LOMOTIL) 2.5-0.025 MG tablet Take by mouth. 02/20/22  Yes [provider]  FLUoxetine (PROZAC) 20 MG capsule Take 20 mg by mouth daily. 08/28/21  Yes [provider]  fluticasone (FLONASE) 50 MCG/ACT nasal spray Place into both nostrils. 03/20/22  Yes [provider]  furosemide (LASIX) 20 MG tablet Take 1 tablet (20 mg total) by mouth daily. Take 40 mg daily for 3 days then reduce to 20 mg Daily Patient taking differently: Take 20 mg by mouth daily as needed for fluid or edema. 01/29/21  Yes Isaiah Serge, NP  meclizine (ANTIVERT) 25 MG tablet Take 25 mg by mouth 3 (three) times daily as needed. 03/20/22  Yes [provider]  nystatin-triamcinolone (MYCOLOG II) cream  Apply topically 2 (two) times daily. 03/26/22  Yes [provider]  Oxycodone HCl 20 MG TABS Take 1 tablet by mouth 3 (three) times daily. 03/19/22  Yes [provider]  pregabalin (LYRICA) 300 MG capsule Take 300 mg by mouth 4 (four) times daily. 09/29/21  Yes [provider]  warfarin (COUMADIN) 5 MG tablet Take 5 mg by mouth daily. 09/20/21  Yes [provider]  zolpidem (AMBIEN) 10 MG tablet Take 10 mg by mouth at bedtime as needed for sleep.   Yes [provider]  diazepam (VALIUM) 10 MG tablet SMARTSIG:0.5 Tablet(s) By Mouth 1-2 Times Daily Patient not taking: Reported  on 04/09/2022 10/22/21   [provider]  losartan (COZAAR) 50 MG tablet Take 1 tablet (50 mg total) by mouth daily. Patient not taking: Reported on 04/09/2022 01/23/21   Isaiah Serge, NP  ondansetron (ZOFRAN) 4 MG tablet Take 4 mg by mouth every 8 (eight) hours as needed. Patient not taking: Reported on 04/09/2022 03/21/22   [provider]  oxycodone (ROXICODONE) 30 MG immediate release tablet Take 15 mg by mouth 3 (three) times daily as needed for pain. 09/18/21   [provider]  potassium chloride (MICRO-K) 10 MEQ CR capsule Take 10 mEq by mouth daily.  Patient not taking: Reported on 04/09/2022 04/14/19   [provider]    Physical Exam: Vitals:   04/09/22 2000 04/09/22 2030 04/09/22 2100 04/09/22 2130  BP: 123/67 123/70 139/79 121/74  Pulse: (!) 107 (!) 103  (!) 101  Resp:    19  Temp:      TempSrc:      SpO2: 93% 93%  95%  Weight:      Height:        General:  Appears calm and comfortable Eyes: PERRL, EOMI, normal lids, iris ENT:  grossly normal hearing, lips & tongue, mmm Neck:  no LAD, masses or thyromegaly Cardiovascular: Irregularly irregular no m/r/g. Trace LE edema.  Respiratory:  Wheezing and ronchi thorughout. Normal respiratory effort. Abdomen:  soft, ntnd, NABS Skin:  no rash or induration seen on limited exam Musculoskeletal:  grossly normal tone BUE/BLE, good ROM, no bony abnormality Psychiatric:  grossly normal mood and affect, speech fluent and appropriate, AOx3 Neurologic:  CN 2-12 grossly intact, moves all extremities in coordinated fashion, sensation intact    Data Reviewed: CT head/spine/chest - Possible RLL infectious process. Otherwise negative. No fracture. No intracranial process.  MRI head - No acute  intracranial process. R vertebral artery occlusion possilble  Assessment and Plan: Active Problems:   Chronic atrial fibrillation with RVR (HCC)   Essential hypertension, benign   CAP (community acquired  pneumonia)   AKI (acute kidney injury) ([place])   AMS (altered mental status)   Chronic diastolic CHF (congestive heart failure) (HCC)  Afib w/ RVR: chronic Afib at baseline. Pt likely dehydrated, acutely ill w/ CAP, and questionable compliance w/ medication. Given Cardizem IV x1 in ED w/ improvement in rate.  - continue home Diltiazem - conitnue coumadin, INR checks - Tele.   CAP: Mild. Noted on CT. WBC 11.2. confused. CTX and Azithro given in ED - Sputum culture - Continue CTX an Azithro w/ likely reduction to single agent at time of DC.   AKI: Cr 1.39 Baseline 1.0. suspect dehydration from AMS and illness. - IVF -BMP in am  AMS: Likely from infection, and possibluy from sedating medications. No evidence for ICH or CVA.  - Hold Oxycodone and Robaxin until am and  then only PRN - Treat CAP  DM: diet controlled. Hyperglycemic on presentaiton - A1c - sensitive SSI  HTN: - Continue Atenolol - Hydralazine PRN  Chronic diastolic CHF: Echo from 0/26/37 consistent w/ diastolic dysfunction. EF 50%. 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  - outpt f/u recommended.     Advance Care Planning:   Code Status: Full Code FULL  Consults: none  Family Communication: none  Severity of Illness: The appropriate patient status for this patient is OBSERVATION. Observation status is judged to be reasonable and necessary in order to provide the required intensity of service to ensure the patient's safety. The patient's presenting symptoms, physical exam findings, and initial radiographic and laboratory data in the context of their medical condition is felt to place them at decreased risk for further clinical deterioration. Furthermore, it is anticipated that the  patient will be medically stable for discharge from the hospital within 2 midnights of admission.   Author: Waldemar Dickens, MD 04/09/2022 10:03 PM  For on call review [email or web address].

## 2022-04-09 NOTE — ED Notes (Signed)
Patient went to get MRI

## 2022-04-10 DIAGNOSIS — F419 Anxiety disorder, unspecified: Secondary | ICD-10-CM | POA: Diagnosis present

## 2022-04-10 DIAGNOSIS — I110 Hypertensive heart disease with heart failure: Secondary | ICD-10-CM | POA: Diagnosis present

## 2022-04-10 DIAGNOSIS — R404 Transient alteration of awareness: Secondary | ICD-10-CM

## 2022-04-10 DIAGNOSIS — R0603 Acute respiratory distress: Secondary | ICD-10-CM | POA: Diagnosis present

## 2022-04-10 DIAGNOSIS — G928 Other toxic encephalopathy: Secondary | ICD-10-CM | POA: Diagnosis present

## 2022-04-10 DIAGNOSIS — M109 Gout, unspecified: Secondary | ICD-10-CM | POA: Diagnosis present

## 2022-04-10 DIAGNOSIS — N179 Acute kidney failure, unspecified: Secondary | ICD-10-CM | POA: Diagnosis present

## 2022-04-10 DIAGNOSIS — E860 Dehydration: Secondary | ICD-10-CM | POA: Diagnosis present

## 2022-04-10 DIAGNOSIS — I429 Cardiomyopathy, unspecified: Secondary | ICD-10-CM | POA: Diagnosis present

## 2022-04-10 DIAGNOSIS — F32A Depression, unspecified: Secondary | ICD-10-CM | POA: Diagnosis present

## 2022-04-10 DIAGNOSIS — Z823 Family history of stroke: Secondary | ICD-10-CM | POA: Diagnosis not present

## 2022-04-10 DIAGNOSIS — T402X5A Adverse effect of other opioids, initial encounter: Secondary | ICD-10-CM | POA: Diagnosis present

## 2022-04-10 DIAGNOSIS — I5032 Chronic diastolic (congestive) heart failure: Secondary | ICD-10-CM | POA: Diagnosis present

## 2022-04-10 DIAGNOSIS — Z801 Family history of malignant neoplasm of trachea, bronchus and lung: Secondary | ICD-10-CM | POA: Diagnosis not present

## 2022-04-10 DIAGNOSIS — I10 Essential (primary) hypertension: Secondary | ICD-10-CM

## 2022-04-10 DIAGNOSIS — I6501 Occlusion and stenosis of right vertebral artery: Secondary | ICD-10-CM | POA: Diagnosis present

## 2022-04-10 DIAGNOSIS — E1165 Type 2 diabetes mellitus with hyperglycemia: Secondary | ICD-10-CM | POA: Diagnosis present

## 2022-04-10 DIAGNOSIS — I482 Chronic atrial fibrillation, unspecified: Secondary | ICD-10-CM

## 2022-04-10 DIAGNOSIS — R0602 Shortness of breath: Secondary | ICD-10-CM | POA: Diagnosis present

## 2022-04-10 DIAGNOSIS — E1142 Type 2 diabetes mellitus with diabetic polyneuropathy: Secondary | ICD-10-CM | POA: Diagnosis present

## 2022-04-10 DIAGNOSIS — T428X5A Adverse effect of antiparkinsonism drugs and other central muscle-tone depressants, initial encounter: Secondary | ICD-10-CM | POA: Diagnosis present

## 2022-04-10 DIAGNOSIS — Z8249 Family history of ischemic heart disease and other diseases of the circulatory system: Secondary | ICD-10-CM | POA: Diagnosis not present

## 2022-04-10 DIAGNOSIS — Z79899 Other long term (current) drug therapy: Secondary | ICD-10-CM | POA: Diagnosis not present

## 2022-04-10 DIAGNOSIS — J189 Pneumonia, unspecified organism: Secondary | ICD-10-CM | POA: Diagnosis present

## 2022-04-10 DIAGNOSIS — Z7901 Long term (current) use of anticoagulants: Secondary | ICD-10-CM | POA: Diagnosis not present

## 2022-04-10 DIAGNOSIS — G8929 Other chronic pain: Secondary | ICD-10-CM | POA: Diagnosis present

## 2022-04-10 LAB — HEMOGLOBIN A1C
Hgb A1c MFr Bld: 6.7 % — ABNORMAL HIGH (ref 4.8–5.6)
Mean Plasma Glucose: 145.59 mg/dL

## 2022-04-10 LAB — GLUCOSE, CAPILLARY
Glucose-Capillary: 136 mg/dL — ABNORMAL HIGH (ref 70–99)
Glucose-Capillary: 225 mg/dL — ABNORMAL HIGH (ref 70–99)
Glucose-Capillary: 189 mg/dL — ABNORMAL HIGH (ref 70–99)
Glucose-Capillary: 113 mg/dL — ABNORMAL HIGH (ref 70–99)

## 2022-04-10 LAB — CBC
HCT: 40.9 % (ref 39.0–52.0)
Hemoglobin: 12.4 g/dL — ABNORMAL LOW (ref 13.0–17.0)
MCH: 22.9 pg — ABNORMAL LOW (ref 26.0–34.0)
MCHC: 30.3 g/dL (ref 30.0–36.0)
MCV: 75.6 fL — ABNORMAL LOW (ref 80.0–100.0)
Platelets: 179 10*3/uL (ref 150–400)
RBC: 5.41 MIL/uL (ref 4.22–5.81)
RDW: 17.2 % — ABNORMAL HIGH (ref 11.5–15.5)
WBC: 7.3 10*3/uL (ref 4.0–10.5)
nRBC: 0 % (ref 0.0–0.2)

## 2022-04-10 LAB — BASIC METABOLIC PANEL
Anion gap: 7 (ref 5–15)
BUN: 24 mg/dL — ABNORMAL HIGH (ref 8–23)
CO2: 26 mmol/L (ref 22–32)
Calcium: 8.7 mg/dL — ABNORMAL LOW (ref 8.9–10.3)
Chloride: 104 mmol/L (ref 98–111)
Creatinine, Ser: 1.01 mg/dL (ref 0.61–1.24)
GFR, Estimated: >60 mL/min (ref >60)
Glucose, Bld: 111 mg/dL — ABNORMAL HIGH (ref 70–99)
Potassium: 3.2 mmol/L — ABNORMAL LOW (ref 3.5–5.1)
Sodium: 137 mmol/L (ref 135–145)

## 2022-04-10 LAB — BRAIN NATRIURETIC PEPTIDE: B Natriuretic Peptide: 447 pg/mL — ABNORMAL HIGH (ref 0.0–100.0)

## 2022-04-10 LAB — MAGNESIUM: Magnesium: 2 mg/dL (ref 1.7–2.4)

## 2022-04-10 LAB — PROCALCITONIN: Procalcitonin: 0.71 ng/mL

## 2022-04-10 LAB — PROTIME-INR
INR: 2 — ABNORMAL HIGH (ref 0.8–1.2)
Prothrombin Time: 22.5 seconds — ABNORMAL HIGH (ref 11.4–15.2)

## 2022-04-10 MED ORDER — POTASSIUM CHLORIDE CRYS ER 20 MEQ PO TBCR
40.0000 meq | EXTENDED_RELEASE_TABLET | Freq: Once | ORAL | Status: AC
  Administered 2022-04-10: 40 meq via ORAL
  Filled 2022-04-10: qty 2

## 2022-04-10 MED ORDER — MAGNESIUM SULFATE 2 GM/50ML IV SOLN
2.0000 g | Freq: Once | INTRAVENOUS | Status: AC
  Administered 2022-04-10: 2 g via INTRAVENOUS
  Filled 2022-04-10: qty 50

## 2022-04-10 MED ORDER — FUROSEMIDE 10 MG/ML IJ SOLN
40.0000 mg | Freq: Two times a day (BID) | INTRAMUSCULAR | Status: DC
  Administered 2022-04-10 – 2022-04-12 (×4): 40 mg via INTRAVENOUS
  Filled 2022-04-10 (×4): qty 4

## 2022-04-10 MED ORDER — WARFARIN SODIUM 2.5 MG PO TABS
5.0000 mg | ORAL_TABLET | Freq: Once | ORAL | Status: AC
  Administered 2022-04-10: 5 mg via ORAL
  Filled 2022-04-10: qty 2

## 2022-04-10 MED ORDER — FUROSEMIDE 40 MG PO TABS: 40.0000 mg | ORAL_TABLET | Freq: Two times a day (BID) | ORAL | Status: DC

## 2022-04-10 NOTE — Progress Notes (Signed)
Patient confused at times during shift. Able to take meds whole, patient got oob earlier this shift and had Bm in the floor, he states he called for help however front desk never received a call from his room , incontinence care provided. Patient also pulled out his IV , new IV placed LUA. Able to feed self with tray set up , placed malewick on patient as he has incontinence episodes of voiding. Repositioned for comfort, call bell in his hand on chest/

## 2022-04-10 NOTE — Progress Notes (Signed)
ANTICOAGULATION CONSULT NOTE -   Pharmacy Consult for Warfarin  Indication: atrial fibrillation  No Known Allergies  Patient Measurements: Height: 6' (182.9 cm) Weight: 104.3 kg (230 lb) IBW/kg (Calculated) : 77.6  Vital Signs: Temp: 98.2 F (36.8 C) (01/31 0545) Temp Source: Oral (01/30 2200) BP: 133/82 (01/31 0545) Pulse Rate: 103 (01/31 0545)  Labs: Recent Labs    04/09/22 1342 04/09/22 1551 04/10/22 0405  HGB 13.4  --  12.4*  HCT 44.9  --  40.9  PLT 237  --  179  LABPROT 20.4*  --  22.5*  INR 1.8*  --  2.0*  CREATININE 1.39*  --  1.01  TROPONINIHS 8 10  --      Estimated Creatinine Clearance: 70.4 mL/min (by C-G formula based on SCr of 1.01 mg/dL).   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

## 2022-04-10 NOTE — Progress Notes (Signed)
ANTICOAGULATION CONSULT NOTE - Initial Consult  Pharmacy Consult for Warfarin  Indication: atrial fibrillation  No Known Allergies  Patient Measurements: Height: 6' (182.9 cm) Weight: 104.3 kg (230 lb) IBW/kg (Calculated) : 77.6  Vital Signs: Temp: 98.8 F (37.1 C) (01/30 2305) Temp Source: Oral (01/30 2200) BP: 140/85 (01/30 2305) Pulse Rate: 108 (01/30 2305)  Labs: Recent Labs    04/09/22 1342 04/09/22 1551  HGB 13.4  --   HCT 44.9  --   PLT 237  --   LABPROT 20.4*  --   INR 1.8*  --   CREATININE 1.39*  --   TROPONINIHS 8 10    Estimated Creatinine Clearance: 51.2 mL/min (A) (by C-G formula based on SCr of 1.39 mg/dL (H)).   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

## 2022-04-10 NOTE — Progress Notes (Signed)
PROGRESS NOTE    Patient: Wayne Middleton                            PCP: Lemmie Evens, MD                    DOB: 1940-12-29            DOA: 04/09/2022 CHE:[id number]             DOS: 04/10/2022, 11:21 AM   LOS: 0 days   Date of Service: The patient was seen and examined on 04/10/2022  Subjective:   The patient was seen and examined this morning. Hemodynamically stable. Complain of generalized weaknesses, mild cough, improved palpitation, denies any chest pain Improved mentation, awake alert oriented x 2  Brief Narrative:    Wayne Middleton is a 82 y.o. male with medical history significant of Afib/chronic, DM, HTN, Neuropathy, anxiety.   Presenting w/ 3 day h/o CP. Pain started after sustaining a R chest wall injury from a mechanical fall. At time of fall, No LOC or head injury. Pain was relatively constant since that time w/ increased pain w/ movement. Pt lives by self but is cared for at times by sons. Son noted an increased HR over the last couple of days and that on day of admission he was acting confused. Pt administers his own medications.  Denies fever, HA, palpitations   CT head/spine/chest - Possible RLL infectious process. Otherwise negative. No fracture. No intracranial process.  MRI head - No acute  intracranial process. R vertebral artery occlusion possilble   Assessment and Plan: Active Problems:   Chronic atrial fibrillation with RVR (HCC)   Essential hypertension, benign   CAP (community acquired pneumonia)   AKI (acute kidney injury) ([place])   AMS (altered mental status)   Chronic diastolic CHF (congestive heart failure) (HCC)   Afib w/ RVR:  -chronic Afib at baseline. Pt likely dehydrated, acutely ill w/ CAP, and questionable compliance w/ medication. Given Cardizem IV x1 in ED w/ improvement in rate.  - continue home Diltiazem - conitnue coumadin, INR checks - Tele. Monitoring    CAP: Mild. Noted on CT. WBC 11.2. confused. CTX and Azithro given in ED -  Sputum culture - Continue CTX an Azithro w/ likely reduction to single agent at time of DC.    AKI: Cr 1.39 Baseline 1.0. suspect dehydration from AMS and illness. - IVF Lab Results  Component Value Date   CREATININE 1.01 04/10/2022   CREATININE 1.39 (H) 04/09/2022   CREATININE 1.00 10/13/2021      AMS: Likely from infection, and possibluy from sedating medications. No evidence for ICH or CVA.  - Hold Oxycodone and Robaxin until am and then only PRN - Treat CAP   DM: diet controlled. Hyperglycemic on presentaiton - A1c - sensitive SSI   HTN: - Continue Atenolol - Hydralazine PRN   Chronic diastolic CHF: Echo from 5/36/14 consistent w/ diastolic dysfunction. EF 50%. 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  - outpt f/u recommended.       ----------------------------------------------------------------------------------------------------------------------------------------------- Nutritional status:  The patient's BMI is: Body mass index is 31.19 kg/m. I agree with the assessment and plan  as outlined below:   ------------------------------------------------------------------------------------------------------------------------------------------------  DVT prophylaxis:   warfarin (COUMADIN) tablet 5 mg   Code Status:   Code Status: Full Code  Family Communication: No family member present at bedside- attempt will be made to update daily The above findings and plan of care has been discussed with patient (and family)  in detail,  they expressed understanding and agreement of above. -Advance care planning has been discussed.   Admission status:   Status is: Inpatient Patient meets inpatient criteria-no need for IV medication  to treat A-fib with RVR and pneumonia  Disposition: From  - home             Planning for discharge in 1-2 days: to   Procedures:   No admission procedures for hospital encounter.   Antimicrobials:  Anti-infectives (From admission, onward)    Start     Dose/Rate Route Frequency Ordered Stop   04/09/22 2200  cefTRIAXone (ROCEPHIN) 2 g in sodium chloride 0.9 % 100 mL IVPB        2 g 200 mL/hr over 30 Minutes Intravenous Every 24 hours 04/09/22 2157 04/14/22 2159   04/09/22 1730  Ampicillin-Sulbactam (UNASYN) 3 g in sodium chloride 0.9 % 100 mL IVPB        3 g 200 mL/hr over 30 Minutes Intravenous  Once 04/09/22 1718 04/09/22 1823   04/09/22 1730  azithromycin (ZITHROMAX) 500 mg in sodium chloride 0.9 % 250 mL IVPB        500 mg 250 mL/hr over 60 Minutes Intravenous Every 24 hours 04/09/22 1718          Medication:   allopurinol  300 mg Oral Daily   atenolol  100 mg Oral BID   diltiazem  240 mg Oral Daily   diltiazem  5 mg Intravenous Once   FLUoxetine  20 mg Oral Daily   insulin aspart  0-6 Units Subcutaneous TID WC   pregabalin  300 mg Oral QID   warfarin  5 mg Oral ONCE-1600   Warfarin - Pharmacist Dosing Inpatient   Does not apply q1600    diazepam, furosemide, hydrALAZINE, ibuprofen, ondansetron **OR** ondansetron (ZOFRAN) IV, oxycodone   Objective:   Vitals:   04/09/22 2305 04/10/22 0545 04/10/22 0700 04/10/22 1000  BP: (!) 140/85 133/82 126/69   Pulse: (!) 108 (!) 103 98 100  Resp: 19 20    Temp: 98.8 F (37.1 C) 98.2 F (36.8 C) 98.3 F (36.8 C)   TempSrc:   Oral   SpO2: 100% 95% 94%   Weight:      Height:        Intake/Output Summary (Last 24 hours) at 04/10/2022 1121 Last data filed at 04/10/2022 0900 Gross per 24 hour  Intake 908.2 ml  Output --  Net 908.2 ml   Filed Weights   04/09/22 1330  Weight: 104.3 kg     Physical examination:   Constitution: AAO x 2 cooperative, no distress,  Appears calm and comfortable  Psychiatric:   Normal  and stable mood and affect, cognition intact,   HEENT:        Normocephalic, PERRL, otherwise with in Normal limits  Chest:         Chest symmetric Cardio vascular:  S1/S2, RRR, No murmure, No Rubs or Gallops  pulmonary: Clear to auscultation bilaterally, respirations unlabored, negative wheezes / crackles Abdomen: Soft, non-tender, non-distended, bowel sounds,no masses, no organomegaly Muscular skeletal: Severe generalized weakness, Limited exam - in bed, able to move all 4  extremities,   Neuro: CNII-XII intact. , normal motor and sensation, reflexes intact  Extremities: No pitting edema lower extremities, +2 pulses  Skin: Dry, warm to touch, negative for any Rashes, No open wounds Wounds: per nursing documentation   ------------------------------------------------------------------------------------------------------------------------------------------    LABs:     Latest Ref Rng & Units 04/10/2022    4:05 AM 04/09/2022    1:42 PM 10/13/2021   12:42 PM  CBC  WBC 4.0 - 10.5 K/uL 7.3  11.2  9.6   Hemoglobin 13.0 - 17.0 g/dL 03.1  28.1  18.8   Hematocrit 39.0 - 52.0 % 40.9  44.9  38.1   Platelets 150 - 400 K/uL 179  237  178       Latest Ref Rng & Units 04/10/2022    4:05 AM 04/09/2022    1:42 PM 10/13/2021   12:42 PM  CMP  Glucose 70 - 99 mg/dL 677  373  668   BUN 8 - 23 mg/dL 24  28  15    Creatinine 0.61 - 1.24 mg/dL  1.59  4.70   Sodium 135 - 145 mmol/L 137  133  136   Potassium 3.5 - 5.1 mmol/L 3.2  3.7  4.1   Chloride 98 - 111 mmol/L 104  96  102   CO2 22 - 32 mmol/L 26  27  28    Calcium 8.9 - 10.3 mg/dL 8.7  8.7  8.5   Total Protein 6.5 - 8.1 g/dL  7.3  6.1   Total Bilirubin 0.3 - 1.2 mg/dL  1.0  1.0   Alkaline Phos 38 - 126 U/L  123  66   AST 15 - 41 U/L  23  18   ALT 0 - 44 U/L  21  14        Micro Results No results found for this or any previous visit (from the past 240 hour(s)).  Radiology Reports MR BRAIN WO CONTRAST  Result Date: 04/09/2022 CLINICAL  DATA:  Altered mental status EXAM: MRI HEAD WITHOUT CONTRAST TECHNIQUE: Multiplanar, multiecho pulse sequences of the brain and surrounding structures were obtained without intravenous contrast. COMPARISON:  None Available. FINDINGS: Brain: No acute infarct, mass effect or extra-axial collection. No chronic microhemorrhage or siderosis. There is confluent hyperintense T2-weighted signal within the white matter. Generalized volume loss. The midline structures are normal. Vascular: Abnormal right vertebral artery flow void. The other flow voids are normal. Skull and upper cervical spine: Normal marrow signal. Sinuses/Orbits: Negative. Other: None. IMPRESSION: 1. No acute intracranial abnormality. 2. Findings of chronic ischemic microangiopathy and volume loss. 3. 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   On: 04/09/2022 19:17   CT Chest Wo Contrast  Result Date: 04/09/2022 CLINICAL DATA:  Status post fall 3 days ago.  Chest pain. EXAM: CT CHEST WITHOUT CONTRAST TECHNIQUE: Multidetector CT imaging of the chest was performed following the standard protocol without IV contrast. RADIATION DOSE REDUCTION: This exam was performed according to the departmental dose-optimization program which includes automated exposure control, adjustment of the mA and/or kV according to patient size and/or use of iterative reconstruction technique. COMPARISON:  08/24/2009 FINDINGS: Cardiovascular: Multi chamber cardiac enlargement identified. No pericardial effusion. Aortic atherosclerosis and 3 vessel coronary artery calcifications. Mediastinum/Nodes: No enlarged mediastinal or axillary lymph nodes. Thyroid gland, trachea, and esophagus demonstrate no significant findings. Lungs/Pleura: No pneumothorax, pleural fluid or signs of pulmonary contusion. There is partial atelectasis of the anterior right middle lobe  with right middle lobe volume loss. Diffuse bronchial wall thickening.  Mild centrilobular and paraseptal emphysema. Bilateral lower lung zone predominant peripheral interstitial reticulation, ground-glass attenuation identified. Extensive tree-in-bud nodularity is identified throughout the right middle lobe and right lower lobe. Perifissural nodule along the major fissure measures 1.2 x 0.5 cm, image 110/5. Upper Abdomen: No acute abnormality. Musculoskeletal: No chest wall mass or suspicious bone lesions identified. IMPRESSION: 1. No acute cardiopulmonary abnormalities. 2. Extensive tree-in-bud nodularity is identified throughout the right middle lobe and right lower lobe. Findings are favored to represent sequelae of chronic aspiration or infection. 3. Diffuse bronchial wall thickening with emphysema, as above; imaging findings suggestive of underlying COPD. 4. Bilateral lower lung zone predominant peripheral interstitial reticulation, ground-glass attenuation identified. Findings are nonspecific but may reflect underlying interstitial lung disease. 5. Perifissural nodule along the major fissure measures 1.2 x 0.5 cm. This most likely represents a benign intrapulmonary lymph node. 6. Age-indeterminate right middle lobe atelectasis with volume loss. 7. Aortic Atherosclerosis (ICD10-I70.0) and Emphysema (ICD10-J43.9). Electronically Signed   By: Signa Kell M.D.   On: 04/09/2022 15:23   CT Head Wo Contrast  Result Date: 04/09/2022 CLINICAL DATA:  Neck Trauma EXAM: CT HEAD WITHOUT CONTRAST CT CERVICAL SPINE WITHOUT CONTRAST CT THORACIC SPINE WITHOUT CONTRAST TECHNIQUE: Multidetector CT imaging of the head and cervical and thoracic spine was performed following the standard protocol without intravenous contrast. Multiplanar CT image reconstructions of the cervical spine were also generated. RADIATION DOSE REDUCTION: This exam was performed according to the departmental dose-optimization program which includes automated exposure control, adjustment of the mA and/or kV according to  patient size and/or use of iterative reconstruction technique. COMPARISON:  None Available. FINDINGS: CT HEAD FINDINGS Brain: No evidence of acute infarction, hemorrhage, hydrocephalus, extra-axial collection or mass lesion/mass effect. There is sequela of moderate chronic microvascular ischemic change. 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 Vascular: No hyperdense vessel or unexpected calcification. Skull: Normal. Negative for fracture or focal lesion. Sinuses/Orbits: No mastoid or middle ear effusion. Paranasal sinuses are clear. Orbits are unremarkable. Other: None CT CERVICAL AND THORACIC SPINE FINDINGS Alignment: There is straightening of the normal cervical lordosis. Skull base and vertebrae: No acute fracture. No primary bone lesion or focal pathologic process. Soft tissues and spinal canal: No prevertebral fluid or swelling. No visible canal hematoma. Disc levels:  No evidence of high-grade spinal canal stenosis. Upper chest: Clustered nodularity in the right lobe right lower lobe, favored to be infectious or inflammatory, see same-day chest for additional findings. Other: None IMPRESSION: 1. No acute intracranial abnormality. 2. No acute fracture or traumatic malalignment of the cervical or thoracic spine. 3. Clustered nodularity in the right lower lobe, favored to be infectious or inflammatory, see same-day chest for additional findings. Electronically Signed   By: Lorenza Cambridge M.D.   On: 04/09/2022 15:18   CT T-SPINE NO CHARGE  Result Date: 04/09/2022 CLINICAL DATA:  Neck Trauma EXAM: CT HEAD WITHOUT CONTRAST CT CERVICAL SPINE WITHOUT CONTRAST CT THORACIC SPINE WITHOUT CONTRAST TECHNIQUE: Multidetector CT imaging of the head and cervical and thoracic spine was performed following the standard protocol without intravenous contrast. Multiplanar CT image reconstructions of the cervical spine were also generated. RADIATION DOSE REDUCTION: This exam was performed according  to the departmental dose-optimization program which includes automated exposure control, adjustment of the mA and/or kV according to patient size and/or use of iterative reconstruction technique. COMPARISON:  None Available. FINDINGS: CT HEAD FINDINGS Brain: No evidence of acute  infarction, hemorrhage, hydrocephalus, extra-axial collection or mass lesion/mass effect. There is sequela of moderate chronic microvascular ischemic change. 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 Vascular: No hyperdense vessel or unexpected calcification. Skull: Normal. Negative for fracture or focal lesion. Sinuses/Orbits: No mastoid or middle ear effusion. Paranasal sinuses are clear. Orbits are unremarkable. Other: None CT CERVICAL AND THORACIC SPINE FINDINGS Alignment: There is straightening of the normal cervical lordosis. Skull base and vertebrae: No acute fracture. No primary bone lesion or focal pathologic process. Soft tissues and spinal canal: No prevertebral fluid or swelling. No visible canal hematoma. Disc levels:  No evidence of high-grade spinal canal stenosis. Upper chest: Clustered nodularity in the right lobe right lower lobe, favored to be infectious or inflammatory, see same-day chest for additional findings. Other: None IMPRESSION: 1. No acute intracranial abnormality. 2. No acute fracture or traumatic malalignment of the cervical or thoracic spine. 3. Clustered nodularity in the right lower lobe, favored to be infectious or inflammatory, see same-day chest for additional findings. Electronically Signed   By: Marin Roberts M.D.   On: 04/09/2022 15:18   CT Cervical Spine Wo Contrast  Result Date: 04/09/2022 CLINICAL DATA:  Neck Trauma EXAM: CT HEAD WITHOUT CONTRAST CT CERVICAL SPINE WITHOUT CONTRAST CT THORACIC SPINE WITHOUT CONTRAST TECHNIQUE: Multidetector CT imaging of the head and cervical and thoracic spine was performed following the standard protocol without intravenous contrast.  Multiplanar CT image reconstructions of the cervical spine were also generated. RADIATION DOSE REDUCTION: This exam was performed according to the departmental dose-optimization program which includes automated exposure control, adjustment of the mA and/or kV according to patient size and/or use of iterative reconstruction technique. COMPARISON:  None Available. FINDINGS: CT HEAD FINDINGS Brain: No evidence of acute infarction, hemorrhage, hydrocephalus, extra-axial collection or mass lesion/mass effect. There is sequela of moderate chronic microvascular ischemic change. 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 Vascular: No hyperdense vessel or unexpected calcification. Skull: Normal. Negative for fracture or focal lesion. Sinuses/Orbits: No mastoid or middle ear effusion. Paranasal sinuses are clear. Orbits are unremarkable. Other: None CT CERVICAL AND THORACIC SPINE FINDINGS Alignment: There is straightening of the normal cervical lordosis. Skull base and vertebrae: No acute fracture. No primary bone lesion or focal pathologic process. Soft tissues and spinal canal: No prevertebral fluid or swelling. No visible canal hematoma. Disc levels:  No evidence of high-grade spinal canal stenosis. Upper chest: Clustered nodularity in the right lobe right lower lobe, favored to be infectious or inflammatory, see same-day chest for additional findings. Other: None IMPRESSION: 1. No acute intracranial abnormality. 2. No acute fracture or traumatic malalignment of the cervical or thoracic spine. 3. Clustered nodularity in the right lower lobe, favored to be infectious or inflammatory, see same-day chest for additional findings. Electronically Signed   By: Marin Roberts M.D.   On: 04/09/2022 15:18    SIGNED: Deatra James, MD, FHM. FAAFP. [facility] - [facility] Time spent > 35 min.  In seeing, evaluating and examining the patient. Reviewing medical records, labs, drawn plan of  care. [facility],  Pager (please use [email or web address] to page/ text) Please use Epic Secure Chat for non-urgent communication (7AM-7PM)  If 7PM-7AM, please contact night-coverage [email or web address], 04/10/2022, 11:21 AM

## 2022-04-10 NOTE — Progress Notes (Signed)
  Transition of Care [facility]) Screening Note   Patient Details  Name: Wayne Middleton Date of Birth: 18-Nov-1940   Transition of Care [facility]) CM/SW Contact:    Ihor Gully, LCSW Phone Number: 04/10/2022, 9:35 AM    Transition of Care Department [facility]) has reviewed patient and no TOC needs have been identified at this time. We will continue to monitor patient advancement through interdisciplinary progression rounds. If new patient transition needs arise, please place a TOC consult.

## 2022-04-10 NOTE — Progress Notes (Signed)
Patient came to floor alert but confused.  Patient has rested well after given food.  Patient vitals have been stable. Patient is able to get up with assistance.  Patient has had 2 episodes of incontinent.

## 2022-04-10 NOTE — Hospital Course (Signed)
Wayne Middleton is a 82 y.o. male with medical history significant of Afib/chronic, DM, HTN, Neuropathy, anxiety.   Presenting w/ 3 day h/o CP. Pain started after sustaining a R chest wall injury from a mechanical fall. At time of fall, No LOC or head injury. Pain was relatively constant since that time w/ increased pain w/ movement. Pt lives by self but is cared for at times by sons. Son noted an increased HR over the last couple of days and that on day of admission he was acting confused. Pt administers his own medications.  Denies fever, HA, palpitations   CT head/spine/chest - Possible RLL infectious process. Otherwise negative. No fracture. No intracranial process.  MRI head - No acute  intracranial process. R vertebral artery occlusion possilble   Assessment and Plan: Active Problems:   Chronic atrial fibrillation with RVR (HCC)   Essential hypertension, benign   CAP (community acquired pneumonia)   AKI (acute kidney injury) ([place])   AMS (altered mental status)   Chronic diastolic CHF (congestive heart failure) (HCC)   Afib w/ RVR:  -chronic Afib at baseline. Pt likely dehydrated, acutely ill w/ CAP, and questionable compliance w/ medication. Given Cardizem IV x1 in ED w/ improvement in rate.  - continue home Diltiazem - conitnue coumadin, INR checks - Tele. Monitoring    CAP: Mild. Noted on CT. WBC 11.2. confused. CTX and Azithro given in ED - Sputum culture - Continue CTX an Azithro w/ likely reduction to single agent at time of DC.    AKI: Cr 1.39 Baseline 1.0. suspect dehydration from AMS and illness. - IVF Lab Results  Component Value Date   CREATININE 1.01 04/10/2022   CREATININE 1.39 (H) 04/09/2022   CREATININE 1.00 10/13/2021      AMS: Likely from infection, and possibluy from sedating medications. No evidence for ICH or CVA.  - Hold Oxycodone and Robaxin until am and then only PRN - Treat CAP   DM: diet controlled. Hyperglycemic on presentaiton - A1c -  sensitive SSI   HTN: - Continue Atenolol - Hydralazine PRN   Chronic diastolic CHF: Echo from 7/78/24 consistent w/ diastolic dysfunction. EF 50%. 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  - outpt f/u recommended.

## 2022-04-11 DIAGNOSIS — J189 Pneumonia, unspecified organism: Secondary | ICD-10-CM | POA: Diagnosis not present

## 2022-04-11 LAB — GLUCOSE, CAPILLARY
Glucose-Capillary: 142 mg/dL — ABNORMAL HIGH (ref 70–99)
Glucose-Capillary: 94 mg/dL (ref 70–99)
Glucose-Capillary: 161 mg/dL — ABNORMAL HIGH (ref 70–99)
Glucose-Capillary: 126 mg/dL — ABNORMAL HIGH (ref 70–99)

## 2022-04-11 LAB — CBC
HCT: 42.1 % (ref 39.0–52.0)
Hemoglobin: 12.4 g/dL — ABNORMAL LOW (ref 13.0–17.0)
MCH: 22.5 pg — ABNORMAL LOW (ref 26.0–34.0)
MCHC: 29.5 g/dL — ABNORMAL LOW (ref 30.0–36.0)
MCV: 76.3 fL — ABNORMAL LOW (ref 80.0–100.0)
Platelets: 231 10*3/uL (ref 150–400)
RBC: 5.52 MIL/uL (ref 4.22–5.81)
RDW: 17.8 % — ABNORMAL HIGH (ref 11.5–15.5)
WBC: 10.3 10*3/uL (ref 4.0–10.5)
nRBC: 0 % (ref 0.0–0.2)

## 2022-04-11 LAB — BASIC METABOLIC PANEL
Anion gap: 8 (ref 5–15)
BUN: 19 mg/dL (ref 8–23)
CO2: 27 mmol/L (ref 22–32)
Calcium: 8.5 mg/dL — ABNORMAL LOW (ref 8.9–10.3)
Chloride: 100 mmol/L (ref 98–111)
Creatinine, Ser: 1.05 mg/dL (ref 0.61–1.24)
GFR, Estimated: >60 mL/min (ref >60)
Glucose, Bld: 132 mg/dL — ABNORMAL HIGH (ref 70–99)
Potassium: 3.6 mmol/L (ref 3.5–5.1)
Sodium: 135 mmol/L (ref 135–145)

## 2022-04-11 LAB — PROTIME-INR
INR: 2.2 — ABNORMAL HIGH (ref 0.8–1.2)
Prothrombin Time: 24.2 seconds — ABNORMAL HIGH (ref 11.4–15.2)

## 2022-04-11 MED ORDER — WARFARIN SODIUM 2.5 MG PO TABS
5.0000 mg | ORAL_TABLET | Freq: Once | ORAL | Status: AC
  Administered 2022-04-11: 5 mg via ORAL
  Filled 2022-04-11: qty 2

## 2022-04-11 NOTE — Progress Notes (Signed)
ANTICOAGULATION CONSULT NOTE -   Pharmacy Consult for Warfarin  Indication: atrial fibrillation  No Known Allergies  Patient Measurements: Height: 6' (182.9 cm) Weight: 104.3 kg (230 lb) IBW/kg (Calculated) : 77.6  Vital Signs: Temp: 98.1 F (36.7 C) (02/01 0349) BP: 144/74 (02/01 0349) Pulse Rate: 78 (02/01 0349)  Labs: Recent Labs    04/09/22 1342 04/09/22 1551 04/10/22 0405 04/11/22 0353  HGB 13.4  --  12.4* 12.4*  HCT 44.9  --  40.9 42.1  PLT 237  --  179 231  LABPROT 20.4*  --  22.5* 24.2*  INR 1.8*  --  2.0* 2.2*  CREATININE 1.39*  --  1.01 1.05  TROPONINIHS 8 10  --   --      Estimated Creatinine Clearance: 67.7 mL/min (by C-G formula based on SCr of 1.05 mg/dL).   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 Home dose listed as 5 mg daily.  INR 2.2- therapeutic CBC WNL  Goal of Therapy:  INR 2-3 Monitor platelets by anticoagulation protocol: Yes   Plan:  Warfarin 5 mg PO x 1 Daily PT/INR Monitor for bleeding  Margot Ables, PharmD Clinical Pharmacist 04/11/2022 7:37 AM

## 2022-04-11 NOTE — Plan of Care (Signed)
  Problem: Acute Rehab PT Goals(only PT should resolve) Goal: Pt Will Go Supine/Side To Sit Outcome: Progressing Flowsheets (Taken 04/11/2022 1357) Pt will go Supine/Side to Sit: Independently Goal: Patient Will Transfer Sit To/From Stand Outcome: Progressing Flowsheets (Taken 04/11/2022 1357) Patient will transfer sit to/from stand:  with supervision  with min guard assist Goal: Pt Will Transfer Bed To Chair/Chair To Bed Outcome: Progressing Flowsheets (Taken 04/11/2022 1357) Pt will Transfer Bed to Chair/Chair to Bed:  with supervision  min guard assist Goal: Pt Will Ambulate Outcome: Progressing Flowsheets (Taken 04/11/2022 1357) Pt will Ambulate:  100 feet  with supervision  with min guard assist  with rolling walker   1:58 PM, 04/11/22 Lonell Grandchild, MPT Physical Therapist with [facility] [phone] office [phone] mobile phone

## 2022-04-11 NOTE — Evaluation (Signed)
Occupational Therapy Evaluation Patient Details Name: Wayne Middleton MRN: [id number] DOB: [birth year] Today's Date: 04/11/2022   History of Present Illness HUNT ZAJICEK is a 82 y.o. male with medical history significant of Afib/chronic, DM, HTN, Neuropathy, anxiety.     Presenting w/ 3 day h/o CP. Pain started after sustaining a R chest wall injury from a mechanical fall. At time of fall, No LOC or head injury. Pain was relatively constant since that time w/ increased pain w/ movement. Pt lives by self but is cared for at times by sons. Son noted an increased HR over the last couple of days and that on day of admission he was acting confused. Pt administers his own medications.  Denies fever, HA, palpitations (Per MD)   Clinical Impression   Pt agreeable to OT and PT co-evaluation. Pt was oriented but impulsive today. Pt demonstrates WFL B UE functional use and strength. Pt primary deficit at this time is balance. When ambulating to the toilet and in the hall the pt demonstrated consistent R lateral lean needing physical assist to maintain balance and prevent a fall. This was while using a RW which pt does not typically use at baseline. Pt did not appear interested in a SNF stay prior to return home but would be a very high fall risk without 24/7 assist and supervision at all times during mobility. Pt left in chair with chair alarm set, tray in front, and call bell within reach. Pt will benefit from continued OT in the hospital and recommended venue below to increase strength, balance, and endurance for safe ADL's.        Recommendations for follow up therapy are one component of a multi-disciplinary discharge planning process, led by the attending physician.  Recommendations may be updated based on patient status, additional functional criteria and insurance authorization.   Follow Up Recommendations  Skilled nursing-short term rehab (<3 hours/day)     Assistance Recommended at Discharge Frequent  or constant Supervision/Assistance  Patient can return home with the following A lot of help with walking and/or transfers;A little help with bathing/dressing/bathroom;Assistance with cooking/housework;Assist for transportation;Help with stairs or ramp for entrance    Functional Status Assessment  Patient has had a recent decline in their functional status and demonstrates the ability to make significant improvements in function in a reasonable and predictable amount of time.  Equipment Recommendations  None recommended by OT           Precautions / Restrictions Precautions Precautions: Fall Restrictions Weight Bearing Restrictions: No      Mobility Bed Mobility Overal bed mobility: Modified Independent             General bed mobility comments: mild labored movement for supine to sit    Transfers Overall transfer level: Needs assistance Equipment used: Rolling walker (2 wheels) Transfers: Sit to/from Stand, Bed to chair/wheelchair/BSC Sit to Stand: Min guard to min A     Step pivot transfers: Min assist, Mod assist     General transfer comment: Level of assist due to much verbal and tactile cuing for safe use of RW and due to balance deficits. Pt transfered from bed to toilet and toilet to chair.      Balance Overall balance assessment: Needs assistance Sitting-balance support: No upper extremity supported, Feet supported Sitting balance-Leahy Scale: Good Sitting balance - Comments: Fair to good seated at EOB   Standing balance support: Bilateral upper extremity supported, During functional activity, Reliant on assistive device for balance Standing  balance-Leahy Scale: Poor Standing balance comment: using RW; R lateral lean                           ADL either performed or assessed with clinical judgement   ADL Overall ADL's : Needs assistance/impaired Eating/Feeding: Set up;Sitting   Grooming: Minimal assistance;Standing   Upper Body Bathing:  Set up;Sitting   Lower Body Bathing: Set up;Sitting/lateral leans   Upper Body Dressing : Set up;Sitting   Lower Body Dressing: Set up;Sitting/lateral leans Lower Body Dressing Details (indicate cue type and reason): Pt able to doff and don sock with mild labored movement seated in recliner. Toilet Transfer: Minimal assistance;Moderate assistance;Ambulation;Rolling walker (2 wheels) Toilet Transfer Details (indicate cue type and reason): Pt ambulated to toilet but with farily significant R lateral lean using RW. Pt able to return to chair from toilet with much verbal and tactile cuing. Toileting- Clothing Manipulation and Hygiene: Supervision/safety;Sitting/lateral lean Toileting - Clothing Manipulation Details (indicate cue type and reason): Pt completed peri-care after bowel movement with lateral and anterior leans from toilet. Tub/ Shower Transfer: Minimal assistance;Moderate assistance;Rolling walker (2 wheels)   Functional mobility during ADLs: Minimal assistance;Moderate assistance;Rolling walker (2 wheels) General ADL Comments: Pt able to ambulate in hall over 50 feet but with needed physical assist due to poor balance leaning to R side.     Vision Baseline Vision/History: 1 Wears glasses Ability to See in Adequate Light: 0 Adequate Patient Visual Report: No change from baseline Vision Assessment?: No apparent visual deficits                Pertinent Vitals/Pain Pain Assessment Pain Assessment: No/denies pain     Hand Dominance Right   Extremity/Trunk Assessment Upper Extremity Assessment Upper Extremity Assessment: Overall WFL for tasks assessed   Lower Extremity Assessment Lower Extremity Assessment: Defer to PT evaluation   Cervical / Trunk Assessment Cervical / Trunk Assessment: Normal   Communication Communication Communication: No difficulties   Cognition Arousal/Alertness: Awake/alert Behavior During Therapy: Impulsive Overall Cognitive Status: No  family/caregiver present to determine baseline cognitive functioning                                 General Comments: Pt was oreiented to place, year, and president, but was impulsive and seemed to lack insight into current functional status.                      Home Living Family/patient expects to be discharged to:: Private residence Living Arrangements: Alone Available Help at Discharge: Family;Available PRN/intermittently Type of Home: House Home Access: Stairs to enter [employer] of Steps: 1 Entrance Stairs-Rails: None Home Layout: One level     Bathroom Shower/Tub: Occupational psychologist: Handicapped height Bathroom Accessibility: Yes How Accessible: Accessible via wheelchair;Accessible via walker Home Equipment: [place] (2 wheels);Cane - single point;Shower seat;BSC/3in1;Wheelchair - manual;Grab bars - toilet;Other (comment) (Walk-in shower grab bars.)   Additional Comments: Reports that sons can give 24/7 assist for a few days at home if needed.      Prior Functioning/Environment Prior Level of Function : Independent/Modified Independent;History of Falls (last six months)             Mobility Comments: Community ambulator without AD. ADLs Comments: Independent ADL and IADL; pt drives.        OT Problem List: Decreased activity tolerance;Impaired balance (sitting  and/or standing);Decreased safety awareness      OT Treatment/Interventions: Self-care/ADL training;Therapeutic exercise;Therapeutic activities;Balance training;Patient/family education;DME and/or AE instruction    OT Goals(Current goals can be found in the care plan section) Acute Rehab OT Goals Patient Stated Goal: return home OT Goal Formulation: With patient Time For Goal Achievement: 04/25/22 Potential to Achieve Goals: Good  OT Frequency: Min 2X/week    Co-evaluation PT/OT/SLP Co-Evaluation/Treatment: Yes Reason for Co-Treatment: To  address functional/ADL transfers   OT goals addressed during session: ADL's and self-care                       End of Session Equipment Utilized During Treatment: Rolling walker (2 wheels);Gait belt  Activity Tolerance: Patient tolerated treatment well Patient left: in chair;with call bell/phone within reach;with chair alarm set  OT Visit Diagnosis: Unsteadiness on feet (R26.81);Other abnormalities of gait and mobility (R26.89);Muscle weakness (generalized) (M62.81);History of falling (Z91.81)                Time: 8416-6063 OT Time Calculation (min): 29 min Charges:  OT General Charges $OT Visit: 1 Visit OT Evaluation $OT Eval Low Complexity: 1 Low  Adreanne Yono OT, MOT   Larey Seat 04/11/2022, 9:57 AM

## 2022-04-11 NOTE — Progress Notes (Signed)
PROGRESS NOTE    Patient: Wayne Middleton                            PCP: Lemmie Evens, MD                    DOB: Oct 26, 1940            DOA: 04/09/2022 WGN:[id number]             DOS: 04/11/2022, 12:15 PM   LOS: 1 day   Date of Service: The patient was seen and examined on 04/11/2022  Subjective:   The patient was seen and examined this morning, stable Complaint of severe generalized weaknesses Status post evaluation by PT OT, who is recommending SNF, Currently patient is refusing SNF--- open to home health  Brief Narrative:    IVERSON SEES is a 82 y.o. male with medical history significant of Afib/chronic, DM, HTN, Neuropathy, anxiety.   Presenting w/ 3 day h/o CP. Pain started after sustaining a R chest wall injury from a mechanical fall. At time of fall, No LOC or head injury. Pain was relatively constant since that time w/ increased pain w/ movement. Pt lives by self but is cared for at times by sons. Son noted an increased HR over the last couple of days and that on day of admission he was acting confused. Pt administers his own medications.  Denies fever, HA, palpitations   CT head/spine/chest - Possible RLL infectious process. Otherwise negative. No fracture. No intracranial process.  MRI head - No acute  intracranial process. R vertebral artery occlusion possilble   Assessment and Plan: Active Problems:   Chronic atrial fibrillation with RVR (HCC)   Essential hypertension, benign   CAP (community acquired pneumonia)   AKI (acute kidney injury) ([place])   AMS (altered mental status)   Chronic diastolic CHF (congestive heart failure) (HCC)   Afib w/ RVR:  -Improved -chronic Afib at baseline. Pt likely dehydrated, acutely ill w/ CAP, and questionable compliance w/ medication. Given Cardizem IV x1 in ED w/ improvement in rate.  - continue home Diltiazem - conitnue coumadin, INR checks -Continue tele. Monitoring    Community-acquired pneumonia -mild respiratory  distress -Currently stable, satting 92% on room air Noted on CT. WBC 11.2. confused. CTX and Azithro given in ED - Sputum culture - Continue CTX an Azithro w/ likely reduction to single agent at time of DC.    AKI: Cr 1.39 Baseline 1.0. suspect dehydration from AMS and illness. - IVF Lab Results  Component Value Date   CREATININE 1.05 04/11/2022   CREATININE 1.01 04/10/2022   CREATININE 1.39 (H) 04/09/2022      AMS:  -Solved back to baseline Likely from infection, and possibluy from sedating medications. No evidence for ICH or CVA.  - Hold Oxycodone and Robaxin until am and then only PRN - Treat CAP   DM: diet controlled. Hyperglycemic on presentaiton - A1c 6.7 - sensitive SSI   HTN: - Continue Atenolol - Hydralazine PRN   Chronic diastolic CHF: Echo from 7/84/69 consistent w/ diastolic dysfunction. EF 50%. 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  - outpt f/u recommended.       -----------------------------------------------------------------------------------------------------------------------------------------------  Nutritional status:  The patient's BMI is: Body mass index is 31.19 kg/m. I agree with the assessment and plan as outlined below:   ------------------------------------------------------------------------------------------------------------------------------------------------  DVT prophylaxis:   warfarin (COUMADIN) tablet 5 mg   Code Status:   Code Status: Full Code  Family Communication: No family member present at bedside- attempt will be made to update daily The above findings and plan of care has been discussed with patient (and family)  in detail,  they expressed understanding and agreement of  above. -Advance care planning has been discussed.   Admission status:   Status is: Inpatient Patient meets inpatient criteria-no need for IV medication to treat A-fib with RVR and pneumonia  Disposition: From  - home             Planning for discharge in 1-2 days: to SNF    Procedures:   No admission procedures for hospital encounter.   Antimicrobials:  Anti-infectives (From admission, onward)    Start     Dose/Rate Route Frequency Ordered Stop   04/09/22 2200  cefTRIAXone (ROCEPHIN) 2 g in sodium chloride 0.9 % 100 mL IVPB        2 g 200 mL/hr over 30 Minutes Intravenous Every 24 hours 04/09/22 2157 04/14/22 2159   04/09/22 1730  Ampicillin-Sulbactam (UNASYN) 3 g in sodium chloride 0.9 % 100 mL IVPB        3 g 200 mL/hr over 30 Minutes Intravenous  Once 04/09/22 1718 04/09/22 1823   04/09/22 1730  azithromycin (ZITHROMAX) 500 mg in sodium chloride 0.9 % 250 mL IVPB        500 mg 250 mL/hr over 60 Minutes Intravenous Every 24 hours 04/09/22 1718          Medication:   allopurinol  300 mg Oral Daily   atenolol  100 mg Oral BID   diltiazem  240 mg Oral Daily   FLUoxetine  20 mg Oral Daily   furosemide  40 mg Intravenous BID   insulin aspart  0-6 Units Subcutaneous TID WC   pregabalin  300 mg Oral QID   warfarin  5 mg Oral ONCE-1600   Warfarin - Pharmacist Dosing Inpatient   Does not apply q1600    diazepam, hydrALAZINE, ondansetron **OR** ondansetron (ZOFRAN) IV, oxycodone   Objective:   Vitals:   04/10/22 1000 04/10/22 1342 04/10/22 2244 04/11/22 0349  BP:  131/76 117/70 (!) 144/74  Pulse: 100 77 86 78  Resp:   16 18  Temp:  98 F (36.7 C) 98.7 F (37.1 C) 98.1 F (36.7 C)  TempSrc:  Oral    SpO2:  99% 92% 93%  Weight:      Height:        Intake/Output Summary (Last 24 hours) at 04/11/2022 1215 Last data filed at 04/11/2022 4097 Gross per 24 hour  Intake 680 ml  Output 500 ml  Net 180 ml   Filed Weights   04/09/22 1330  Weight: 104.3 kg      Physical examination:   General:  AAO x 3,  cooperative, no distress;   HEENT:  Normocephalic, PERRL, otherwise with in Normal limits   Neuro:  CNII-XII intact. , normal motor and sensation, reflexes intact   Lungs:   Clear to auscultation BL, Respirations unlabored,  No wheezes / crackles  Cardio:    S1/S2, RRR, No murmure, No Rubs or Gallops   Abdomen:  Soft, non-tender, bowel sounds active all four quadrants, no guarding or peritoneal signs.  Muscular  skeletal:  Limited exam -Sever global generalized weaknesses - in bed, able to move all 4 extremities,   2+ pulses,  symmetric, No pitting edema  Skin:  Dry, warm to touch, negative for any Rashes,  Wounds: Please see nursing documentation         ------------------------------------------------------------------------------------------------------------------------------------------    LABs:     Latest Ref Rng & Units 04/11/2022    3:53 AM 04/10/2022    4:05 AM 04/09/2022    1:42 PM  CBC  WBC 4.0 - 10.5 K/uL 10.3  7.3  11.2   Hemoglobin 13.0 - 17.0 g/dL 12.4  12.4  13.4   Hematocrit 39.0 - 52.0 % 42.1  40.9  44.9   Platelets 150 - 400 K/uL 231  179  237       Latest Ref Rng & Units 04/11/2022    3:53 AM 04/10/2022    4:05 AM 04/09/2022    1:42 PM  CMP  Glucose 70 - 99 mg/dL 132  111  134   BUN 8 - 23 mg/dL 19  24  28    Creatinine 0.61 - 1.24 mg/dL 1.05  1.01  1.39   Sodium 135 - 145 mmol/L 135  137  133   Potassium 3.5 - 5.1 mmol/L 3.6  3.2  3.7   Chloride 98 - 111 mmol/L 100  104  96   CO2 22 - 32 mmol/L 27  26  27    Calcium 8.9 - 10.3 mg/dL 8.5  8.7  8.7   Total Protein 6.5 - 8.1 g/dL   7.3   Total Bilirubin 0.3 - 1.2 mg/dL   1.0   Alkaline Phos 38 - 126 U/L   123   AST 15 - 41 U/L   23   ALT 0 - 44 U/L   21        Micro Results No results found for this or any previous visit (from the past 240 hour(s)).  Radiology Reports No results found.  SIGNED: Deatra James, MD, FHM. FAAFP. Zacarias Pontes -  [facility] hospitalist Time spent > 35 min.  In seeing, evaluating and examining the patient. Reviewing medical records, labs, drawn plan of care. [facility],  Pager (please use [email or web address] to page/ text) Please use Epic Secure Chat for non-urgent communication (7AM-7PM)  If 7PM-7AM, please contact night-coverage [email or web address], 04/11/2022, 12:15 PM

## 2022-04-11 NOTE — Plan of Care (Signed)
  Problem: Acute Rehab OT Goals (only OT should resolve) Goal: Pt. Will Perform Grooming Flowsheets (Taken 04/11/2022 1004) Pt Will Perform Grooming:  Independently  standing Goal: Pt. Will Perform Lower Body Dressing Flowsheets (Taken 04/11/2022 1004) Pt Will Perform Lower Body Dressing:  Independently  sitting/lateral leans  sit to/from stand Goal: Pt. Will Transfer To Toilet Flowsheets (Taken 04/11/2022 1004) Pt Will Transfer to Toilet:  Independently  ambulating Goal: Pt. Will Perform Toileting-Clothing Manipulation Flowsheets (Taken 04/11/2022 1004) Pt Will Perform Toileting - Clothing Manipulation and hygiene:  Independently  sitting/lateral leans  Maylie Ashton OT, MOT

## 2022-04-11 NOTE — TOC Initial Note (Signed)
Transition of Care [facility]) - Initial/Assessment Note    Patient Details  Name: Wayne Middleton MRN: [id number] Date of Birth: 1940/06/14  [facility] of [facility] CM/SW Contact:    Ihor Gully, LCSW Phone Number: 04/11/2022, 11:07 AM  Clinical Narrative:                 Patient from home alone. Admitted for PNA. Has two sons, has daily contact with at least one of them. Drives, ambulates unassisted, independent with ADLs. Discussed PT recommendation for SNF with son, Darrel. Darrel stated that patient would fight going to SNF. Agreeable to [facility]. Referred to [facility] as they are accepting of patient's insurance.   Expected Discharge Plan: Home/Self Care Barriers to Discharge: Continued Medical Work up   Patient Goals and CMS Choice Patient states their goals for this hospitalization and ongoing recovery are:: home with home health per son          Expected Discharge Plan and Services       Living arrangements for the past 2 months: Single Family Home                           HH Arranged: PT, RN [place] Agency: [place] [facility] Agency Contacted: 04/11/22 Time HH Agency Contacted: 55 Representative spoke with at [place]: [place] Arrangements/Services Living arrangements for the past 2 months: [place] with:: Self Patient language and need for interpreter reviewed:: Yes Do you feel safe going back to the place where you live?: Yes      Need for Family Participation in Patient Care: Yes (Comment) Care giver support system in place?: Yes (comment)   Criminal Activity/Legal Involvement Pertinent to Current Situation/Hospitalization: No - Comment as needed  Activities of Daily Living Home Assistive Devices/Equipment: None ADL Screening (condition at time of admission) Patient's cognitive ability adequate to safely complete daily activities?: No (patient currently confused) Is the patient deaf or have difficulty hearing?: No Does  the patient have difficulty seeing, even when wearing glasses/contacts?: No Does the patient have difficulty concentrating, remembering, or making decisions?: Yes Patient able to express need for assistance with ADLs?: Yes Does the patient have difficulty dressing or bathing?: Yes Independently performs ADLs?: No Communication: Independent Dressing (OT): Needs assistance Is this a change from baseline?: Change from baseline, expected to last <3days Grooming: Needs assistance Is this a change from baseline?: Change from baseline, expected to last <3 days Feeding: Independent Bathing: Needs assistance Is this a change from baseline?: Change from baseline, expected to last <3 days Toileting: Needs assistance Is this a change from baseline?: Change from baseline, expected to last <3 days In/Out Bed: Needs assistance Is this a change from baseline?: Change from baseline, expected to last <3 days Walks in Home: Needs assistance Is this a change from baseline?: Change from baseline, expected to last <3 days Weakness of Legs: Both Weakness of Arms/Hands: None  Permission Sought/Granted Permission sought to share information with : Family Supports    Share Information with NAME: son, Darrel           Emotional Assessment       Orientation: : Oriented to Self Alcohol / Substance Use: Not Applicable Psych Involvement: No (comment)  Admission diagnosis:  Acute respiratory failure ([place]) [J96.00] Confusion [R41.0] AKI (acute kidney injury) ([place]) [N17.9] Aspiration pneumonia of right lower lobe due to regurgitated food ([place]) [J69.0] Chest pain, unspecified type [R07.9]  Pneumonia [J18.9] Patient Active Problem List   Diagnosis Date Noted   Pneumonia 04/10/2022   Acute respiratory failure ([place]) 04/09/2022   CAP (community acquired pneumonia) 04/09/2022   AKI (acute kidney injury) ([place]) 04/09/2022   AMS (altered mental status) 04/09/2022   Chronic diastolic CHF (congestive heart  failure) ([place]) 04/09/2022   Peripheral neuropathy 11/11/2018   History of cardiomyopathy 11/03/2013   Chronic atrial fibrillation with RVR (HCC)    Chronic anticoagulation    Essential hypertension, benign    PCP:  Lemmie Evens, MD Pharmacy:   [facility], [address] [place] [address] Phone: [phone] Fax: [phone]     Social Determinants of Health (SDOH) Social History: SDOH Screenings   Food Insecurity: No Food Insecurity (04/10/2022)  Housing: Low Risk  (04/10/2022)  Transportation Needs: No Transportation Needs (04/10/2022)  Utilities: Not At Risk (04/10/2022)  Tobacco Use: Low Risk  (04/09/2022)   SDOH Interventions:     Readmission Risk Interventions     No data to display

## 2022-04-11 NOTE — Evaluation (Signed)
Physical Therapy Evaluation Patient Details Name: Wayne Middleton MRN: [id number] DOB: [birth year] Today's Date: 04/11/2022  History of Present Illness  Wayne Middleton is a 82 y.o. male with medical history significant of Afib/chronic, DM, HTN, Neuropathy, anxiety.     Presenting w/ 3 day h/o CP. Pain started after sustaining a R chest wall injury from a mechanical fall. At time of fall, No LOC or head injury. Pain was relatively constant since that time w/ increased pain w/ movement. Pt lives by self but is cared for at times by sons. Son noted an increased HR over the last couple of days and that on day of admission he was acting confused. Pt administers his own medications.  Denies fever, HA, palpitations   Clinical Impression  Patient unable to stand without AD due to BLE weakness, required use of RW for transfers and gait training due to fall risk.  Patient impulsive and requires repeated verbal/tactile for safety to avoid loss of balance, demonstrates frequent drifting left/right and scissoring of legs during ambulation and followed with wheelchair for safety.  Patient tolerated sitting up in chair after therapy - nursing staff aware.  Patient will benefit from continued skilled physical therapy in hospital and recommended venue below to increase strength, balance, endurance for safe ADLs and gait.         Recommendations for follow up therapy are one component of a multi-disciplinary discharge planning process, led by the attending physician.  Recommendations may be updated based on patient status, additional functional criteria and insurance authorization.  Follow Up Recommendations Skilled nursing-short term rehab (<3 hours/day) Can patient physically be transported by private vehicle: Yes    Assistance Recommended at Discharge Frequent or constant Supervision/Assistance  Patient can return home with the following  A little help with walking and/or transfers;A little help with  bathing/dressing/bathroom;Help with stairs or ramp for entrance;Assistance with cooking/housework    Equipment Recommendations    Recommendations for Other Services       Functional Status Assessment Patient has had a recent decline in their functional status and demonstrates the ability to make significant improvements in function in a reasonable and predictable amount of time.     Precautions / Restrictions Precautions Precautions: Fall Restrictions Weight Bearing Restrictions: No      Mobility  Bed Mobility Overal bed mobility: Modified Independent                  Transfers Overall transfer level: Needs assistance Equipment used: Rolling walker (2 wheels) Transfers: Sit to/from Stand, Bed to chair/wheelchair/BSC Sit to Stand: Min guard, Min assist   Step pivot transfers: Min assist, Mod assist       General transfer comment: unable to stand using RW due to BLE weakness, required use of RW for safety    Ambulation/Gait Ambulation/Gait assistance: Min assist, Mod assist Gait Distance (Feet): 45 Feet Assistive device: Rolling walker (2 wheels) Gait Pattern/deviations: Decreased step length - right, Decreased step length - left, Decreased stride length, Drifts right/left, Scissoring Gait velocity: decreased     General Gait Details: labored movement with frequent veering left/right and scissoring of legs with near loss of balance, limited mostly due to fatigue and fall risk  Stairs            Wheelchair Mobility    Modified Rankin (Stroke Patients Only)       Balance Overall balance assessment: Needs assistance Sitting-balance support: Feet supported, No upper extremity supported Sitting balance-Leahy Scale: Fair Sitting balance -  Comments: fair/good seated at EOB   Standing balance support: During functional activity, No upper extremity supported Standing balance-Leahy Scale: Poor Standing balance comment: fair/poor using RW                              Pertinent Vitals/Pain Pain Assessment Pain Assessment: No/denies pain    Home Living Family/patient expects to be discharged to:: Private residence Living Arrangements: Alone Available Help at Discharge: Family;Available PRN/intermittently Type of Home: House Home Access: Stairs to enter Entrance Stairs-Rails: None Entrance Stairs-Number of Steps: 1   Home Layout: One level Home Equipment: Conservation officer, nature (2 wheels);Cane - single point;Shower seat;BSC/3in1;Wheelchair - manual;Grab bars - toilet;Other (comment) Additional Comments: Reports that sons can give 24/7 assist for a few days at home if needed.    Prior Function Prior Level of Function : Independent/Modified Independent;History of Falls (last six months)             Mobility Comments: Community ambulator without AD. ADLs Comments: Independent ADL and IADL; pt drives.     Hand Dominance   Dominant Hand: Right    Extremity/Trunk Assessment   Upper Extremity Assessment Upper Extremity Assessment: Defer to OT evaluation    Lower Extremity Assessment Lower Extremity Assessment: Generalized weakness    Cervical / Trunk Assessment Cervical / Trunk Assessment: Normal  Communication   Communication: No difficulties  Cognition Arousal/Alertness: Awake/alert Behavior During Therapy: Impulsive Overall Cognitive Status: No family/caregiver present to determine baseline cognitive functioning                                          General Comments      Exercises     Assessment/Plan    PT Assessment Patient needs continued PT services  PT Problem List Decreased strength;Decreased activity tolerance;Decreased balance;Decreased mobility       PT Treatment Interventions DME instruction;Gait training;Stair training;Functional mobility training;Therapeutic activities;Therapeutic exercise;Balance training;Patient/family education    PT Goals (Current goals can be found  in the Care Plan section)  Acute Rehab PT Goals Patient Stated Goal: return home with family to assist PT Goal Formulation: With patient Time For Goal Achievement: 04/25/22 Potential to Achieve Goals: Good    Frequency Min 3X/week     Co-evaluation PT/OT/SLP Co-Evaluation/Treatment: Yes Reason for Co-Treatment: To address functional/ADL transfers;Necessary to address cognition/behavior during functional activity PT goals addressed during session: Mobility/safety with mobility;Balance;Proper use of DME         AM-PAC PT "6 Clicks" Mobility  Outcome Measure Help needed turning from your back to your side while in a flat bed without using bedrails?: None Help needed moving from lying on your back to sitting on the side of a flat bed without using bedrails?: None Help needed moving to and from a bed to a chair (including a wheelchair)?: A Little Help needed standing up from a chair using your arms (e.g., wheelchair or bedside chair)?: A Little Help needed to walk in hospital room?: A Lot Help needed climbing 3-5 steps with a railing? : A Lot 6 Click Score: 18    End of Session   Activity Tolerance: Patient tolerated treatment well;Patient limited by fatigue Patient left: in chair;with call bell/phone within reach Nurse Communication: Mobility status PT Visit Diagnosis: Unsteadiness on feet (R26.81);Other abnormalities of gait and mobility (R26.89);Other symptoms and signs involving the nervous system (R29.898)  Time: 1696-7893 PT Time Calculation (min) (ACUTE ONLY): 29 min   Charges:   PT Evaluation $PT Eval Moderate Complexity: 1 Mod PT Treatments $Therapeutic Activity: 23-37 mins        1:56 PM, 04/11/22 Lonell Grandchild, MPT Physical Therapist with [facility] [phone] office [phone] mobile phone

## 2022-04-12 DIAGNOSIS — J189 Pneumonia, unspecified organism: Secondary | ICD-10-CM | POA: Diagnosis not present

## 2022-04-12 LAB — CBC
HCT: 43 % (ref 39.0–52.0)
Hemoglobin: 13.1 g/dL (ref 13.0–17.0)
MCH: 22.8 pg — ABNORMAL LOW (ref 26.0–34.0)
MCHC: 30.5 g/dL (ref 30.0–36.0)
MCV: 74.9 fL — ABNORMAL LOW (ref 80.0–100.0)
Platelets: 206 10*3/uL (ref 150–400)
RBC: 5.74 MIL/uL (ref 4.22–5.81)
RDW: 18.3 % — ABNORMAL HIGH (ref 11.5–15.5)
WBC: 7.5 10*3/uL (ref 4.0–10.5)
nRBC: 0 % (ref 0.0–0.2)

## 2022-04-12 LAB — BASIC METABOLIC PANEL
Anion gap: 5 (ref 5–15)
BUN: 18 mg/dL (ref 8–23)
CO2: 28 mmol/L (ref 22–32)
Calcium: 8.3 mg/dL — ABNORMAL LOW (ref 8.9–10.3)
Chloride: 100 mmol/L (ref 98–111)
Creatinine, Ser: 1.01 mg/dL (ref 0.61–1.24)
GFR, Estimated: >60 mL/min (ref >60)
Glucose, Bld: 119 mg/dL — ABNORMAL HIGH (ref 70–99)
Potassium: 3.3 mmol/L — ABNORMAL LOW (ref 3.5–5.1)
Sodium: 133 mmol/L — ABNORMAL LOW (ref 135–145)

## 2022-04-12 LAB — PROTIME-INR
INR: 2.4 — ABNORMAL HIGH (ref 0.8–1.2)
Prothrombin Time: 26.3 seconds — ABNORMAL HIGH (ref 11.4–15.2)

## 2022-04-12 LAB — GLUCOSE, CAPILLARY: Glucose-Capillary: 119 mg/dL — ABNORMAL HIGH (ref 70–99)

## 2022-04-12 MED ORDER — LEVOFLOXACIN 500 MG PO TABS: 500.0000 mg | ORAL_TABLET | Freq: Every day | ORAL | 0 refills | Status: AC

## 2022-04-12 MED ORDER — FUROSEMIDE 20 MG PO TABS: 20.0000 mg | ORAL_TABLET | Freq: Every day | ORAL | 2 refills | Status: DC

## 2022-04-12 MED ORDER — WARFARIN SODIUM 2.5 MG PO TABS: 5.0000 mg | ORAL_TABLET | Freq: Once | ORAL | Status: DC

## 2022-04-12 MED ORDER — POTASSIUM CHLORIDE CRYS ER 20 MEQ PO TBCR
40.0000 meq | EXTENDED_RELEASE_TABLET | Freq: Once | ORAL | Status: AC
  Administered 2022-04-12: 40 meq via ORAL
  Filled 2022-04-12: qty 2

## 2022-04-12 MED ORDER — POTASSIUM CHLORIDE ER 10 MEQ PO CPCR: 10.0000 meq | ORAL_CAPSULE | Freq: Every day | ORAL | 1 refills | Status: DC

## 2022-04-12 NOTE — Care Management Important Message (Signed)
Important Message  Patient Details  Name: Wayne Middleton MRN: [id number] Date of Birth: 29-Dec-1940   Medicare Important Message Given:  Yes     Tommy Medal 04/12/2022, 10:59 AM

## 2022-04-12 NOTE — Discharge Summary (Signed)
Physician Discharge Summary   Patient: Wayne Middleton MRN: [id number] DOB: [birth year]  Admit date:     04/09/2022  Discharge date: 04/12/22  Discharge Physician: Deatra James   PCP: Lemmie Evens, MD   Recommendations at discharge:  Follow-up with PCP within 1 week Continue home health PT OT, fall precautions Continue current recommended medications Continue with Coumadin, routine INR checks Follow-up with a cardiologist in 2-4 weeks  Discharge Diagnoses: Principal Problem:   Pneumonia Active Problems:   Chronic atrial fibrillation with RVR ([place])   Essential hypertension, benign   CAP (community acquired pneumonia)   AKI (acute kidney injury) ([place])   AMS (altered mental status)   Chronic diastolic CHF (congestive heart failure) ([place])  Resolved Problems:   * No resolved hospital problems. *  Hospital Course:  [name] is a 82 y.o. male with medical history significant of Afib/chronic, DM, HTN, Neuropathy, anxiety.   Presenting w/ 3 day h/o CP. Pain started after sustaining a R chest wall injury from a mechanical fall. At time of fall, No LOC or head injury. Pain was relatively constant since that time w/ increased pain w/ movement. Pt lives by self but is cared for at times by sons. Son noted an increased HR over the last couple of days and that on day of admission he was acting confused. Pt administers his own medications.  Denies fever, HA, palpitations   CT head/spine/chest - Possible RLL infectious process. Otherwise negative. No fracture. No intracranial process.  MRI head - No acute  intracranial process. R vertebral artery occlusion possilble   Assessment and Plan: Active Problems:   Chronic atrial fibrillation with RVR ([facility])   Essential hypertension, benign   CAP (community acquired pneumonia)   AKI (acute kidney injury) ([place])   AMS (altered mental status)   Chronic diastolic CHF (congestive heart failure) ([facility])   Afib w/ RVR:  -Improved -chronic Afib  at baseline. Pt likely dehydrated, acutely ill w/ CAP, and questionable compliance w/ medication. Given Cardizem IV x1 in ED w/ improvement in rate.  - continue home Diltiazem - conitnue coumadin, INR checks -Continue tele. Monitoring    Community-acquired pneumonia -mild respiratory distress -Currently stable, satting 92% on room air Noted on CT. WBC 11.2. confused. CTX and Azithro given in ED - Sputum culture - Continue CTX ; was treated with IV antibiotics Rocephin and azithromycin  Switching to p.o. Levaquin  AKI: Cr 1.39 Baseline 1.0. suspect dehydration from AMS and illness. - IVF Lab Results  Component Value Date   CREATININE 1.05 04/11/2022   CREATININE 1.01 04/10/2022   CREATININE 1.39 (H) 04/09/2022      AMS:  -Solved back to baseline Likely from infection, and possibluy from sedating medications. No evidence for ICH or CVA.  - Hold Oxycodone and Robaxin until am and then only PRN - Treat CAP   DM: diet controlled. Hyperglycemic on presentaiton - A1c 6.7 - sensitive SSI   HTN: - Continue Atenolol - Hydralazine PRN   Chronic diastolic CHF: Echo from 8/41/32 consistent w/ diastolic dysfunction. EF 50%. 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  - outpt f/u recommended.      Consultants: PT/OT  Disposition: Home with home health Diet recommendation:  Discharge Diet Orders (  From admission, onward)     Start     Ordered   04/12/22 0000  Diet - low sodium heart healthy        04/12/22 1038           Cardiac diet DISCHARGE MEDICATION: Allergies as of 04/12/2022   No Known Allergies      Medication List     STOP taking these medications    losartan 50 MG tablet Commonly known as: COZAAR   meclizine 25 MG  tablet Commonly known as: ANTIVERT   ondansetron 4 MG tablet Commonly known as: ZOFRAN   zolpidem 10 MG tablet Commonly known as: AMBIEN       TAKE these medications    allopurinol 300 MG tablet Commonly known as: ZYLOPRIM Take 300 mg by mouth daily.   atenolol 100 MG tablet Commonly known as: TENORMIN Take 100 mg by mouth 2 (two) times daily.   cetirizine 10 MG tablet Commonly known as: ZYRTEC Take 10 mg by mouth daily.   diazepam 10 MG tablet Commonly known as: VALIUM SMARTSIG:0.5 Tablet(s) By Mouth 1-2 Times Daily   diltiazem 240 MG 24 hr capsule Commonly known as: CARDIZEM CD Take 1 capsule (240 mg total) by mouth daily.   diphenoxylate-atropine 2.5-0.025 MG tablet Commonly known as: LOMOTIL Take by mouth.   FLUoxetine 20 MG capsule Commonly known as: PROZAC Take 20 mg by mouth daily.   fluticasone 50 MCG/ACT nasal spray Commonly known as: FLONASE Place into both nostrils.   furosemide 20 MG tablet Commonly known as: Lasix Take 1 tablet (20 mg total) by mouth daily. What changed: additional instructions   levofloxacin 500 MG tablet Commonly known as: Levaquin Take 1 tablet (500 mg total) by mouth daily for 3 days.   nystatin-triamcinolone cream Commonly known as: MYCOLOG II Apply topically 2 (two) times daily.   oxycodone 30 MG immediate release tablet Commonly known as: ROXICODONE Take 15 mg by mouth 3 (three) times daily as needed for pain. What changed: Another medication with the same name was removed. Continue taking this medication, and follow the directions you see here.   potassium chloride 10 MEQ CR capsule Commonly known as: MICRO-K Take 1 capsule (10 mEq total) by mouth daily.   pregabalin 300 MG capsule Commonly known as: LYRICA Take 300 mg by mouth 4 (four) times daily.   warfarin 5 MG tablet Commonly known as: COUMADIN Take 5 mg by mouth daily.        Discharge Exam: Filed Weights   04/09/22 1330  Weight: 104.3 kg     General:  AAO x 3,  cooperative, no distress;   HEENT:  Normocephalic, PERRL, otherwise with in Normal limits   Neuro:  CNII-XII intact. , normal motor and sensation, reflexes intact   Lungs:   Clear to auscultation BL, Respirations unlabored,  No wheezes / crackles  Cardio:    S1/S2, RRR, No murmure, No Rubs or Gallops   Abdomen:  Soft, non-tender, bowel sounds active all four quadrants, no guarding or peritoneal signs.  Muscular  skeletal:  Limited exam -global generalized weaknesses - in bed, able to move all 4 extremities,   2+ pulses,  symmetric, No pitting edema  Skin:  Dry, warm to touch, negative for any Rashes,  Wounds: Please see nursing documentation          Condition at discharge: fair  The results of significant diagnostics from this hospitalization (including imaging, microbiology, ancillary and laboratory) are listed below for reference.  Imaging Studies: MR BRAIN WO CONTRAST  Result Date: 04/09/2022 CLINICAL DATA:  Altered mental status EXAM: MRI HEAD WITHOUT CONTRAST TECHNIQUE: Multiplanar, multiecho pulse sequences of the brain and surrounding structures were obtained without intravenous contrast. COMPARISON:  None Available. FINDINGS: Brain: No acute infarct, mass effect or extra-axial collection. No chronic microhemorrhage or siderosis. There is confluent hyperintense T2-weighted signal within the white matter. Generalized volume loss. The midline structures are normal. Vascular: Abnormal right vertebral artery flow void. The other flow voids are normal. Skull and upper cervical spine: Normal marrow signal. Sinuses/Orbits: Negative. Other: None. IMPRESSION: 1. No acute intracranial abnormality. 2. Findings of chronic ischemic microangiopathy and volume loss. 3. 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   On: 04/09/2022 19:17   CT Chest Wo Contrast  Result Date: 04/09/2022 CLINICAL DATA:  Status post fall  3 days ago.  Chest pain. EXAM: CT CHEST WITHOUT CONTRAST TECHNIQUE: Multidetector CT imaging of the chest was performed following the standard protocol without IV contrast. RADIATION DOSE REDUCTION: This exam was performed according to the departmental dose-optimization program which includes automated exposure control, adjustment of the mA and/or kV according to patient size and/or use of iterative reconstruction technique. COMPARISON:  08/24/2009 FINDINGS: Cardiovascular: Multi chamber cardiac enlargement identified. No pericardial effusion. Aortic atherosclerosis and 3 vessel coronary artery calcifications. Mediastinum/Nodes: No enlarged mediastinal or axillary lymph nodes. Thyroid gland, trachea, and esophagus demonstrate no significant findings. Lungs/Pleura: No pneumothorax, pleural fluid or signs of pulmonary contusion. There is partial atelectasis of the anterior right middle lobe with right middle lobe volume loss. Diffuse bronchial wall thickening. Mild centrilobular and paraseptal emphysema. Bilateral lower lung zone predominant peripheral interstitial reticulation, ground-glass attenuation identified. Extensive tree-in-bud nodularity is identified throughout the right middle lobe and right lower lobe. Perifissural nodule along the major fissure measures 1.2 x 0.5 cm, image 110/5. Upper Abdomen: No acute abnormality. Musculoskeletal: No chest wall mass or suspicious bone lesions identified. IMPRESSION: 1. No acute cardiopulmonary abnormalities. 2. Extensive tree-in-bud nodularity is identified throughout the right middle lobe and right lower lobe. Findings are favored to represent sequelae of chronic aspiration or infection. 3. Diffuse bronchial wall thickening with emphysema, as above; imaging findings suggestive of underlying COPD. 4. Bilateral lower lung zone predominant peripheral interstitial reticulation, ground-glass attenuation identified. Findings are nonspecific but may reflect underlying  interstitial lung disease. 5. Perifissural nodule along the major fissure measures 1.2 x 0.5 cm. This most likely represents a benign intrapulmonary lymph node. 6. Age-indeterminate right middle lobe atelectasis with volume loss. 7. Aortic Atherosclerosis (ICD10-I70.0) and Emphysema (ICD10-J43.9). Electronically Signed   By: Kerby Moors M.D.   On: 04/09/2022 15:23   CT Head Wo Contrast  Result Date: 04/09/2022 CLINICAL DATA:  Neck Trauma EXAM: CT HEAD WITHOUT CONTRAST CT CERVICAL SPINE WITHOUT CONTRAST CT THORACIC SPINE WITHOUT CONTRAST TECHNIQUE: Multidetector CT imaging of the head and cervical and thoracic spine was performed following the standard protocol without intravenous contrast. Multiplanar CT image reconstructions of the cervical spine were also generated. RADIATION DOSE REDUCTION: This exam was performed according to the departmental dose-optimization program which includes automated exposure control, adjustment of the mA and/or kV according to patient size and/or use of iterative reconstruction technique. COMPARISON:  None Available. FINDINGS: CT HEAD FINDINGS Brain: No evidence of acute infarction, hemorrhage, hydrocephalus, extra-axial collection or mass lesion/mass effect. There is sequela of moderate chronic microvascular ischemic change. 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 Vascular: No  hyperdense vessel or unexpected calcification. Skull: Normal. Negative for fracture or focal lesion. Sinuses/Orbits: No mastoid or middle ear effusion. Paranasal sinuses are clear. Orbits are unremarkable. Other: None CT CERVICAL AND THORACIC SPINE FINDINGS Alignment: There is straightening of the normal cervical lordosis. Skull base and vertebrae: No acute fracture. No primary bone lesion or focal pathologic process. Soft tissues and spinal canal: No prevertebral fluid or swelling. No visible canal hematoma. Disc levels:  No evidence of high-grade spinal canal stenosis.  Upper chest: Clustered nodularity in the right lobe right lower lobe, favored to be infectious or inflammatory, see same-day chest for additional findings. Other: None IMPRESSION: 1. No acute intracranial abnormality. 2. No acute fracture or traumatic malalignment of the cervical or thoracic spine. 3. Clustered nodularity in the right lower lobe, favored to be infectious or inflammatory, see same-day chest for additional findings. Electronically Signed   By: Lorenza Cambridge M.D.   On: 04/09/2022 15:18   CT T-SPINE NO CHARGE  Result Date: 04/09/2022 CLINICAL DATA:  Neck Trauma EXAM: CT HEAD WITHOUT CONTRAST CT CERVICAL SPINE WITHOUT CONTRAST CT THORACIC SPINE WITHOUT CONTRAST TECHNIQUE: Multidetector CT imaging of the head and cervical and thoracic spine was performed following the standard protocol without intravenous contrast. Multiplanar CT image reconstructions of the cervical spine were also generated. RADIATION DOSE REDUCTION: This exam was performed according to the departmental dose-optimization program which includes automated exposure control, adjustment of the mA and/or kV according to patient size and/or use of iterative reconstruction technique. COMPARISON:  None Available. FINDINGS: CT HEAD FINDINGS Brain: No evidence of acute infarction, hemorrhage, hydrocephalus, extra-axial collection or mass lesion/mass effect. There is sequela of moderate chronic microvascular ischemic change. 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 Vascular: No hyperdense vessel or unexpected calcification. Skull: Normal. Negative for fracture or focal lesion. Sinuses/Orbits: No mastoid or middle ear effusion. Paranasal sinuses are clear. Orbits are unremarkable. Other: None CT CERVICAL AND THORACIC SPINE FINDINGS Alignment: There is straightening of the normal cervical lordosis. Skull base and vertebrae: No acute fracture. No primary bone lesion or focal pathologic process. Soft tissues and spinal  canal: No prevertebral fluid or swelling. No visible canal hematoma. Disc levels:  No evidence of high-grade spinal canal stenosis. Upper chest: Clustered nodularity in the right lobe right lower lobe, favored to be infectious or inflammatory, see same-day chest for additional findings. Other: None IMPRESSION: 1. No acute intracranial abnormality. 2. No acute fracture or traumatic malalignment of the cervical or thoracic spine. 3. Clustered nodularity in the right lower lobe, favored to be infectious or inflammatory, see same-day chest for additional findings. Electronically Signed   By: Lorenza Cambridge M.D.   On: 04/09/2022 15:18   CT Cervical Spine Wo Contrast  Result Date: 04/09/2022 CLINICAL DATA:  Neck Trauma EXAM: CT HEAD WITHOUT CONTRAST CT CERVICAL SPINE WITHOUT CONTRAST CT THORACIC SPINE WITHOUT CONTRAST TECHNIQUE: Multidetector CT imaging of the head and cervical and thoracic spine was performed following the standard protocol without intravenous contrast. Multiplanar CT image reconstructions of the cervical spine were also generated. RADIATION DOSE REDUCTION: This exam was performed according to the departmental dose-optimization program which includes automated exposure control, adjustment of the mA and/or kV according to patient size and/or use of iterative reconstruction technique. COMPARISON:  None Available. FINDINGS: CT HEAD FINDINGS Brain: No evidence of acute infarction, hemorrhage, hydrocephalus, extra-axial collection or mass lesion/mass effect. There is sequela of moderate chronic microvascular ischemic change. There is chronic small-vessel infarct in the right lentiform  nucleus. Vascular: No hyperdense vessel or unexpected calcification. Skull: Normal. Negative for fracture or focal lesion. Sinuses/Orbits: No mastoid or middle ear effusion. Paranasal sinuses are clear. Orbits are unremarkable. Other: None CT CERVICAL AND THORACIC SPINE FINDINGS Alignment: There is straightening of the normal  cervical lordosis. Skull base and vertebrae: No acute fracture. No primary bone lesion or focal pathologic process. Soft tissues and spinal canal: No prevertebral fluid or swelling. No visible canal hematoma. Disc levels:  No evidence of high-grade spinal canal stenosis. Upper chest: Clustered nodularity in the right lobe right lower lobe, favored to be infectious or inflammatory, see same-day chest for additional findings. Other: None IMPRESSION: 1. No acute intracranial abnormality. 2. No acute fracture or traumatic malalignment of the cervical or thoracic spine. 3. Clustered nodularity in the right lower lobe, favored to be infectious or inflammatory, see same-day chest for additional findings. Electronically Signed   By: Marin Roberts M.D.   On: 04/09/2022 15:18    Microbiology: Results for orders placed or performed during the hospital encounter of 02/22/10  MRSA PCR Screening     Status: None   Collection Time: 02/23/10  2:25 AM  Result Value Ref Range Status   MRSA by PCR  NEGATIVE Final    NEGATIVE        The GeneXpert MRSA Assay (FDA approved for NASAL specimens only), is one component of a comprehensive MRSA colonization surveillance program. It is not intended to diagnose MRSA infection nor to guide or monitor treatment for MRSA infections.    Labs: CBC: Recent Labs  Lab 04/09/22 1342 04/10/22 0405 04/11/22 0353 04/12/22 0822  WBC 11.2* 7.3 10.3 7.5  HGB 13.4 12.4* 12.4* 13.1  HCT 44.9 40.9 42.1 43.0  MCV 76.8* 75.6* 76.3* 74.9*  PLT 237 179 231 505   Basic Metabolic Panel: Recent Labs  Lab 04/09/22 1342 04/10/22 0405 04/10/22 0848 04/11/22 0353 04/12/22 0353  NA 133* 137  --  135 133*  K 3.7 3.2*  --  3.6 3.3*  CL 96* 104  --  100 100  CO2 27 26  --  27 28  GLUCOSE 134* 111*  --  132* 119*  BUN 28* 24*  --  19 18  CREATININE 1.39* 1.01  --  1.05 1.01  CALCIUM 8.7* 8.7*  --  8.5* 8.3*  MG  --   --  2.0  --   --    Liver Function Tests: Recent Labs  Lab  04/09/22 1342  AST 23  ALT 21  ALKPHOS 123  BILITOT 1.0  PROT 7.3  ALBUMIN 3.5   CBG: Recent Labs  Lab 04/11/22 0753 04/11/22 1207 04/11/22 1733 04/11/22 2136 04/12/22 0755  GLUCAP 142* 94 161* 126* 119*    Discharge time spent: greater than 30 minutes.  Signed: Deatra James, MD [facility] 04/12/2022

## 2022-04-12 NOTE — Progress Notes (Signed)
ANTICOAGULATION CONSULT NOTE -   Pharmacy Consult for Warfarin  Indication: atrial fibrillation  No Known Allergies  Patient Measurements: Height: 6' (182.9 cm) Weight: 104.3 kg (230 lb) IBW/kg (Calculated) : 77.6  Vital Signs: Temp: 98.3 F (36.8 C) (02/02 0519) Temp Source: Oral (02/02 0519) BP: 126/83 (02/02 0519) Pulse Rate: 75 (02/02 0519)  Labs: Recent Labs    04/09/22 1342 04/09/22 1551 04/10/22 0405 04/11/22 0353 04/12/22 0353  HGB 13.4  --  12.4* 12.4*  --   HCT 44.9  --  40.9 42.1  --   PLT 237  --  179 231  --   LABPROT 20.4*  --  22.5* 24.2* 26.3*  INR 1.8*  --  2.0* 2.2* 2.4*  CREATININE 1.39*  --  1.01 1.05 1.01  TROPONINIHS 8 10  --   --   --      Estimated Creatinine Clearance: 70.4 mL/min (by C-G formula based on SCr of 1.01 mg/dL).   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 Home dose listed as 5 mg daily.  INR 2.4- therapeutic CBC WNL  Goal of Therapy:  INR 2-3 Monitor platelets by anticoagulation protocol: Yes   Plan:  Warfarin 5 mg PO x 1 Daily PT/INR Monitor for bleeding  Isac Sarna, BS Pharm D, BCPS Clinical Pharmacist 04/12/2022 7:59 AM

## 2022-04-17 ENCOUNTER — Emergency Department (HOSPITAL_COMMUNITY): Payer: Medicare HMO

## 2022-04-17 ENCOUNTER — Inpatient Hospital Stay (HOSPITAL_COMMUNITY)
Admission: EM | Admit: 2022-04-17 | Discharge: 2022-04-19 | DRG: 071 | Disposition: A | Discharge status: Skilled Nursing Facility | Payer: Medicare HMO | Attending: Internal Medicine | Admitting: Internal Medicine

## 2022-04-17 ENCOUNTER — Encounter (HOSPITAL_COMMUNITY): Payer: Self-pay

## 2022-04-17 ENCOUNTER — Other Ambulatory Visit: Payer: Self-pay

## 2022-04-17 DIAGNOSIS — R410 Disorientation, unspecified: Secondary | ICD-10-CM

## 2022-04-17 DIAGNOSIS — N179 Acute kidney failure, unspecified: Secondary | ICD-10-CM

## 2022-04-17 DIAGNOSIS — Z7901 Long term (current) use of anticoagulants: Secondary | ICD-10-CM

## 2022-04-17 DIAGNOSIS — Z8249 Family history of ischemic heart disease and other diseases of the circulatory system: Secondary | ICD-10-CM

## 2022-04-17 DIAGNOSIS — E873 Alkalosis: Secondary | ICD-10-CM | POA: Diagnosis present

## 2022-04-17 DIAGNOSIS — E860 Dehydration: Secondary | ICD-10-CM | POA: Diagnosis present

## 2022-04-17 DIAGNOSIS — I10 Essential (primary) hypertension: Secondary | ICD-10-CM | POA: Diagnosis present

## 2022-04-17 DIAGNOSIS — K7682 Hepatic encephalopathy: Secondary | ICD-10-CM | POA: Diagnosis present

## 2022-04-17 DIAGNOSIS — I482 Chronic atrial fibrillation, unspecified: Secondary | ICD-10-CM | POA: Diagnosis present

## 2022-04-17 DIAGNOSIS — I5032 Chronic diastolic (congestive) heart failure: Secondary | ICD-10-CM | POA: Diagnosis not present

## 2022-04-17 DIAGNOSIS — F419 Anxiety disorder, unspecified: Secondary | ICD-10-CM | POA: Diagnosis present

## 2022-04-17 DIAGNOSIS — Z1152 Encounter for screening for COVID-19: Secondary | ICD-10-CM

## 2022-04-17 DIAGNOSIS — I4821 Permanent atrial fibrillation: Secondary | ICD-10-CM

## 2022-04-17 DIAGNOSIS — G9341 Metabolic encephalopathy: Principal | ICD-10-CM | POA: Diagnosis present

## 2022-04-17 DIAGNOSIS — F32A Depression, unspecified: Secondary | ICD-10-CM | POA: Diagnosis present

## 2022-04-17 DIAGNOSIS — R402412 Glasgow coma scale score 13-15, at arrival to emergency department: Secondary | ICD-10-CM

## 2022-04-17 DIAGNOSIS — Z79899 Other long term (current) drug therapy: Secondary | ICD-10-CM

## 2022-04-17 DIAGNOSIS — R791 Abnormal coagulation profile: Secondary | ICD-10-CM | POA: Diagnosis present

## 2022-04-17 DIAGNOSIS — F1120 Opioid dependence, uncomplicated: Secondary | ICD-10-CM | POA: Diagnosis present

## 2022-04-17 DIAGNOSIS — E1142 Type 2 diabetes mellitus with diabetic polyneuropathy: Secondary | ICD-10-CM | POA: Diagnosis present

## 2022-04-17 DIAGNOSIS — I110 Hypertensive heart disease with heart failure: Secondary | ICD-10-CM | POA: Diagnosis present

## 2022-04-17 DIAGNOSIS — W19XXXA Unspecified fall, initial encounter: Secondary | ICD-10-CM | POA: Diagnosis present

## 2022-04-17 HISTORY — DX: Metabolic encephalopathy: G93.41

## 2022-04-17 LAB — CBC
HCT: 37.1 % — ABNORMAL LOW (ref 39.0–52.0)
Hemoglobin: 11 g/dL — ABNORMAL LOW (ref 13.0–17.0)
MCH: 22.5 pg — ABNORMAL LOW (ref 26.0–34.0)
MCHC: 29.6 g/dL — ABNORMAL LOW (ref 30.0–36.0)
MCV: 75.9 fL — ABNORMAL LOW (ref 80.0–100.0)
Platelets: 200 10*3/uL (ref 150–400)
RBC: 4.89 MIL/uL (ref 4.22–5.81)
RDW: 17.3 % — ABNORMAL HIGH (ref 11.5–15.5)
WBC: 7.2 10*3/uL (ref 4.0–10.5)
nRBC: 0 % (ref 0.0–0.2)

## 2022-04-17 LAB — ACETAMINOPHEN LEVEL: Acetaminophen (Tylenol), Serum: <10 ug/mL — ABNORMAL LOW (ref 10–30)

## 2022-04-17 LAB — COMPREHENSIVE METABOLIC PANEL
ALT: 22 U/L (ref 0–44)
AST: 27 U/L (ref 15–41)
Albumin: 3 g/dL — ABNORMAL LOW (ref 3.5–5.0)
Alkaline Phosphatase: 136 U/L — ABNORMAL HIGH (ref 38–126)
Anion gap: 9 (ref 5–15)
BUN: 25 mg/dL — ABNORMAL HIGH (ref 8–23)
CO2: 27 mmol/L (ref 22–32)
Calcium: 8.4 mg/dL — ABNORMAL LOW (ref 8.9–10.3)
Chloride: 100 mmol/L (ref 98–111)
Creatinine, Ser: 1.33 mg/dL — ABNORMAL HIGH (ref 0.61–1.24)
GFR, Estimated: 53 mL/min — ABNORMAL LOW (ref >60)
Glucose, Bld: 122 mg/dL — ABNORMAL HIGH (ref 70–99)
Potassium: 3.5 mmol/L (ref 3.5–5.1)
Sodium: 136 mmol/L (ref 135–145)
Total Bilirubin: 0.9 mg/dL (ref 0.3–1.2)
Total Protein: 6.5 g/dL (ref 6.5–8.1)

## 2022-04-17 LAB — URINALYSIS, ROUTINE W REFLEX MICROSCOPIC
Bilirubin Urine: NEGATIVE
Glucose, UA: NEGATIVE mg/dL
Hgb urine dipstick: NEGATIVE
Ketones, ur: NEGATIVE mg/dL
Leukocytes,Ua: NEGATIVE
Nitrite: NEGATIVE
Protein, ur: NEGATIVE mg/dL
Specific Gravity, Urine: 1.018 (ref 1.005–1.030)
pH: 5 (ref 5.0–8.0)

## 2022-04-17 LAB — LACTIC ACID, PLASMA: Lactic Acid, Venous: 1.3 mmol/L (ref 0.5–1.9)

## 2022-04-17 LAB — PROTIME-INR
INR: 3.9 — ABNORMAL HIGH (ref 0.8–1.2)
Prothrombin Time: 37.8 seconds — ABNORMAL HIGH (ref 11.4–15.2)

## 2022-04-17 LAB — RESP PANEL BY RT-PCR (RSV, FLU A&B, COVID)  RVPGX2
Influenza A by PCR: NEGATIVE
Influenza B by PCR: NEGATIVE
Resp Syncytial Virus by PCR: NEGATIVE
SARS Coronavirus 2 by RT PCR: NEGATIVE

## 2022-04-17 LAB — BLOOD GAS, VENOUS
Acid-Base Excess: 9.3 mmol/L — ABNORMAL HIGH (ref 0.0–2.0)
Bicarbonate: 33.5 mmol/L — ABNORMAL HIGH (ref 20.0–28.0)
Drawn by: 5183
O2 Saturation: 59.5 %
Patient temperature: 37
pCO2, Ven: 43 mmHg — ABNORMAL LOW (ref 44–60)
pH, Ven: 7.5 — ABNORMAL HIGH (ref 7.25–7.43)
pO2, Ven: 36 mmHg (ref 32–45)

## 2022-04-17 LAB — CK: Total CK: 139 U/L (ref 49–397)

## 2022-04-17 LAB — RAPID URINE DRUG SCREEN, HOSP PERFORMED
Amphetamines: NOT DETECTED
Barbiturates: NOT DETECTED
Benzodiazepines: POSITIVE — AB
Cocaine: NOT DETECTED
Opiates: POSITIVE — AB
Tetrahydrocannabinol: NOT DETECTED

## 2022-04-17 LAB — SALICYLATE LEVEL: Salicylate Lvl: <7 mg/dL — ABNORMAL LOW (ref 7.0–30.0)

## 2022-04-17 LAB — CBG MONITORING, ED: Glucose-Capillary: 110 mg/dL — ABNORMAL HIGH (ref 70–99)

## 2022-04-17 LAB — AMMONIA: Ammonia: 14 umol/L (ref 9–35)

## 2022-04-17 LAB — ETHANOL: Alcohol, Ethyl (B): <10 mg/dL (ref <10)

## 2022-04-17 MED ORDER — ONDANSETRON HCL 4 MG PO TABS
4.0000 mg | ORAL_TABLET | Freq: Four times a day (QID) | ORAL | Status: DC | PRN
  Administered 2022-04-18: 4 mg via ORAL
  Filled 2022-04-17: qty 1

## 2022-04-17 MED ORDER — NALOXONE HCL 0.4 MG/ML IJ SOLN
0.4000 mg | Freq: Once | INTRAMUSCULAR | Status: DC
  Filled 2022-04-17: qty 1

## 2022-04-17 MED ORDER — POLYETHYLENE GLYCOL 3350 17 G PO PACK: 17.0000 g | PACK | Freq: Every day | ORAL | Status: DC | PRN

## 2022-04-17 MED ORDER — SODIUM CHLORIDE 0.9 % IV SOLN: INTRAVENOUS | Status: DC

## 2022-04-17 MED ORDER — NALOXONE HCL 0.4 MG/ML IJ SOLN
0.4000 mg | Freq: Once | INTRAMUSCULAR | Status: AC
  Administered 2022-04-17: 0.4 mg via INTRAVENOUS
  Filled 2022-04-17: qty 1

## 2022-04-17 MED ORDER — CHLORHEXIDINE GLUCONATE CLOTH 2 % EX PADS
6.0000 | MEDICATED_PAD | Freq: Every day | CUTANEOUS | Status: DC
  Administered 2022-04-18 – 2022-04-19 (×2): 6 via TOPICAL

## 2022-04-17 MED ORDER — DEXTROSE-NACL 5-0.9 % IV SOLN: INTRAVENOUS | Status: DC

## 2022-04-17 MED ORDER — ACETAMINOPHEN 650 MG RE SUPP: 650.0000 mg | Freq: Four times a day (QID) | RECTAL | Status: DC | PRN

## 2022-04-17 MED ORDER — ACETAMINOPHEN 325 MG PO TABS
650.0000 mg | ORAL_TABLET | Freq: Four times a day (QID) | ORAL | Status: DC | PRN
  Administered 2022-04-18 (×2): 650 mg via ORAL
  Filled 2022-04-17 (×2): qty 2

## 2022-04-17 MED ORDER — ONDANSETRON HCL 4 MG/2ML IJ SOLN: 4.0000 mg | Freq: Four times a day (QID) | INTRAMUSCULAR | Status: DC | PRN

## 2022-04-17 NOTE — H&P (Signed)
History and Physical    STANTON KISSOON ZDG:[id number] DOB: [birth year] DOA: 04/17/2022  PCP: Lemmie Evens, MD   Patient coming from: Home  I have personally briefly reviewed patient's old medical records in [place]  Chief Complaint: AMS  HPI: Wayne Middleton is a 82 y.o. male with medical history significant for atrial fibrillation on chronic anticoagulation, diastolic CHF, hypertension. Patient was recently admitted 1/30 to 2/2 for pneumonia, altered mental status, atrial fibrillation with RVR.  He was given 1 dose of Cardizem drip in ED, and continued at home Cardizem with improvement in his heart rate.  He was treated with IV ceftriaxone and azithromycin in the hospital transition to p.o. Levaquin on discharge.   At the time of my evaluation, patient is quite somnolent, unable to answer questions.  No family is at bedside.  ED provider had talked to patient's son earlier.  Patient's son came to check on patient today and found patient on the floor.  He reports after discharge, patient had improvement in his mental status.  But today patient is more confused than he was during recent hospitalization.  They also report that patient's medications were gone, so its is unknown if patient took them, or what he took, or what ever else he might have done with them.  ED Course: Tmax 99.7.  Heart rate 63-86.  Respiratory rate 13 -25.  Blood pressure systolic 595 - 638.  O2 sats 94 to 98% on room air. INR 3.9 Unremarkable blood alcohol, acetaminophen, and salicylate level Lactic acid 1.3. COVID influenza RSV negative VBG pH of 7.5, pCO2- 43. UDS positive for opiates, benzos. Chest x-ray negative for acute abnormality, head CT also unremarkable.  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 He has never used smokeless tobacco. He reports that he does not drink alcohol and does not use drugs.  No Known Allergies  Family History  Problem Relation Age of Onset   Stroke Mother 30   Heart attack Father 31   Lung cancer Brother        69 of 5 brothers deceased due to Carcinoma of the lung    Prior to Admission medications   Medication Sig Start Date End Date Taking? Authorizing Provider  allopurinol (ZYLOPRIM) 300 MG tablet Take 300 mg by mouth daily. 07/24/21   [provider]  atenolol (TENORMIN) 100 MG tablet Take 100 mg by mouth 2 (two) times daily. 08/14/20   [provider]  cetirizine (ZYRTEC) 10 MG tablet Take 10 mg by mouth daily. 12/20/20   [provider]  diazepam (VALIUM) 10 MG tablet SMARTSIG:0.5 Tablet(s) By Mouth 1-2 Times Daily Patient not taking: Reported on 04/09/2022 10/22/21   [provider]  diltiazem (CARDIZEM CD) 240 MG 24 hr capsule Take 1 capsule (240 mg total) by mouth daily. 02/15/21   Satira Sark, MD  diphenoxylate-atropine (LOMOTIL) 2.5-0.025 MG tablet Take by mouth. 02/20/22   [provider]  FLUoxetine (PROZAC) 20 MG capsule Take 20 mg by mouth daily. 08/28/21   [provider]  fluticasone (FLONASE) 50 MCG/ACT nasal spray Place into both nostrils. 03/20/22   [provider]  furosemide (LASIX) 20 MG tablet Take 1 tablet (20 mg total) by mouth daily. 04/12/22 04/12/23  Deatra James, MD   nystatin-triamcinolone (MYCOLOG II) cream Apply topically 2 (two) times daily. 03/26/22   [provider]  oxycodone (ROXICODONE) 30 MG immediate release tablet Take 15 mg by mouth 3 (three) times daily as needed for pain. 09/18/21   [provider]  potassium chloride (MICRO-K) 10 MEQ CR capsule Take 1 capsule (10 mEq total) by mouth daily. 04/12/22 06/11/22  Shahmehdi, Valeria Batman, MD  pregabalin (LYRICA) 300 MG capsule Take 300 mg by mouth 4 (four) times daily. 09/29/21   [provider]  warfarin (COUMADIN) 5 MG tablet Take 5 mg by mouth daily. 09/20/21   [provider]    Physical Exam: Vitals:   04/17/22 2010 04/17/22 2045 04/17/22 2100 04/17/22 2130  BP:   (!) 147/63 (!) 124/95  Pulse:   77 77  Resp:   (!) 25 (!) 24  Temp:  98.6 F (37 C)    TempSrc:  Oral    SpO2:   98% 98%  Weight: 100.7 kg     Height: 6' (1.829 m)       Constitutional: Somnolent  would occasionally open eyes to vigorous touch, Vitals:   04/17/22 2010 04/17/22 2045 04/17/22 2100 04/17/22 2130  BP:   (!) 147/63 (!) 124/95  Pulse:   77 77  Resp:   (!) 25 (!) 24  Temp:  98.6 F (37 C)    TempSrc:  Oral    SpO2:   98% 98%  Weight: 100.7 kg     Height: 6' (1.829 m)      Eyes: PERRL, lids and conjunctivae normal ENMT: Mucous membranes are very dry.  Neck: normal, supple, no masses, no thyromegaly Respiratory: clear to auscultation bilaterally, no wheezing, no crackles. Normal respiratory effort. No accessory muscle use.  Cardiovascular: Regular rate and rhythm, no murmurs / rubs / gallops. No extremity edema.  Abdomen: Abdominal distended, soft, no tenderness, no masses palpated.  Musculoskeletal: no clubbing / cyanosis. No joint deformity upper and lower extremities. Skin: Chronic stasis changes to bilateral lower extremities, open blister to right lateral leg  Neurologic: Limited exam due to AMS, localizing pain, moving bilateral upper extremities-more so to the right than  left, minimal movement of bilateral lower extremities Psychiatric: Somnolent  Labs on Admission: I have personally reviewed following labs and imaging studies  CBC: Recent Labs  Lab 04/11/22 0353 04/12/22 0822 04/17/22 1637  WBC 10.3 7.5 7.2  HGB 12.4* 13.1 11.0*  HCT 42.1 43.0 37.1*  MCV 76.3* 74.9* 75.9*  PLT 231 206 119   Basic Metabolic Panel: Recent Labs  Lab 04/11/22 0353 04/12/22 0353 04/17/22 1637  NA 135 133* 136  K 3.6 3.3* 3.5  CL 100 100 100  CO2 27 28 27   GLUCOSE 132* 119* 122*  BUN 19 18 25*  CREATININE 1.05 1.01 1.33*  CALCIUM 8.5* 8.3* 8.4*   GFR: Estimated Creatinine Clearance: 52.6 mL/min (A) (by C-G formula based on SCr of 1.33  mg/dL (H)). Liver Function Tests: Recent Labs  Lab 04/17/22 1637  AST 27  ALT 22  ALKPHOS 136*  BILITOT 0.9  PROT 6.5  ALBUMIN 3.0*   No results for input(s): "LIPASE", "AMYLASE" in the last 168 hours. Recent Labs  Lab 04/17/22 1648  AMMONIA 14   Coagulation Profile: Recent Labs  Lab 04/11/22 0353 04/12/22 0353 04/17/22 1637  INR 2.2* 2.4* 3.9*   CBG: Recent Labs  Lab 04/11/22 1207 04/11/22 1733 04/11/22 2136 04/12/22 0755 04/17/22 1644  GLUCAP 94 161* 126* 119* 110*   Urine analysis:    Component Value Date/Time   COLORURINE YELLOW 04/17/2022 2005   APPEARANCEUR TURBID (A) 04/17/2022 2005   LABSPEC 1.018 04/17/2022 2005   PHURINE 5.0 04/17/2022 2005   GLUCOSEU NEGATIVE 04/17/2022 2005   HGBUR NEGATIVE 04/17/2022 2005   BILIRUBINUR NEGATIVE 04/17/2022 2005   KETONESUR NEGATIVE 04/17/2022 2005   PROTEINUR NEGATIVE 04/17/2022 2005   NITRITE NEGATIVE 04/17/2022 2005   LEUKOCYTESUR NEGATIVE 04/17/2022 2005    Radiological Exams on Admission: DG Chest 2 View  Result Date: 04/17/2022 CLINICAL DATA:  Recent pneumonia, now with altered mental status. EXAM: CHEST - 2 VIEW COMPARISON:  Chest radiograph dated 10/13/2021 and CT chest dated 04/09/2022. FINDINGS: The heart is enlarged. Vascular  calcifications are seen in the aortic arch. There is mild bibasilar atelectasis. No pleural effusion or pneumothorax. Degenerative changes are seen in the spine. IMPRESSION: 1. Mild bibasilar atelectasis. 2. Cardiomegaly. Electronically Signed   By: Romona Curls M.D.   On: 04/17/2022 19:47   CT Head Wo Contrast  Result Date: 04/17/2022 CLINICAL DATA:  Altered mental status, found down. EXAM: CT HEAD WITHOUT CONTRAST TECHNIQUE: Contiguous axial images were obtained from the base of the skull through the vertex without intravenous contrast. RADIATION DOSE REDUCTION: This exam was performed according to the departmental dose-optimization program which includes automated exposure control, adjustment of the mA and/or kV according to patient size and/or use of iterative reconstruction technique. COMPARISON:  CT head dated 04/09/2022. FINDINGS: Brain: No evidence of acute infarction, hemorrhage, hydrocephalus, extra-axial collection or mass lesion/mass effect. There is mild cerebral volume loss with associated ex vacuo dilatation. Periventricular white matter hypoattenuation likely represents chronic small vessel ischemic disease. Vascular: There are vascular calcifications in the carotid siphons. Skull: Normal. Negative for fracture or focal lesion. Sinuses/Orbits: No acute finding. Other: None. IMPRESSION: No acute intracranial process. Electronically Signed   By: Romona Curls M.D.   On: 04/17/2022 19:45    EKG: Independently reviewed.  Atrial fibrillation. RAte 89, QTc 443.  Assessment/Plan Principal Problem:   Acute metabolic encephalopathy Active Problems:   Chronic atrial fibrillation with RVR (HCC)   Chronic anticoagulation   Essential hypertension, benign   Chronic diastolic CHF (congestive heart failure) (HCC)   Assessment and Plan: * Acute metabolic encephalopathy Currently quite somnolent, occasionally open his eyes to vigorous touch, localizing pain.  Exact etiology undetermined.  Possible  polypharmacy/drug overdose.  Home medications include Valium, oxycodone, atenolol, diltiazem, Prozac, Lyrica, warfarin, Lasix.  No response to 0.4 Narcan.  INR is elevated at 3.9.  Blood pressure heart rate stable.  Head CT negative for acute abnormality.  He is positive for benzos and opiates.  Recent hospitalization, treated for pneumonia, discharged on Levaquin. Quinolones can cos alteration in mental status, but I doubt to this level. -VBG shows pH of 7.5, pCO2 of 43 -Daily PT/INR -if continues to increase then AMS likely from medications -Admit to stepdown -EKG showing atrial fibrillation, stable QTc. -Repeat  EKG in a.m. -N.p.o. for now - D5 N/s 75cc/hr x 20hrs - Check CK - BID CBG while NPO -If no improvement in the status/pending clinical course, may need to consider MRI  Chronic diastolic CHF (congestive heart failure) ([place]) Stable and compensated.  Patient appears dehydrated.  Last echo 03/2021, EF of 50 to 55%. -Hold 20 mg Lasix for now  Essential hypertension, benign Stable. -Hold atenolol and Cardizem for now  Chronic atrial fibrillation with RVR (HCC) Rate controlled on anticoagulation with warfarin. -Hold Atenolol and diltiazem for now -INR supratherapeutic at 3.9, monitor daily -Hold warfarin   DVT prophylaxis: SCDS, supratherapeutic INR Code Status: Full Family Communication: None at bedside presently.  Son was previously present. Disposition Plan: ~ 1 -2 days Consults called:  None  Admission status:  Obs stepdown    Author: Bethena Roys, MD 04/17/2022 10:28 PM  For on call review [email or web address].

## 2022-04-17 NOTE — ED Notes (Signed)
Receiving RN Janett Billow (ICU) has agreed to accept [facility] once pt has arrived to inpatient unit, all questions and concerns address.

## 2022-04-17 NOTE — Assessment & Plan Note (Signed)
Stable. -Hold atenolol and Cardizem for now

## 2022-04-17 NOTE — Assessment & Plan Note (Signed)
Stable and compensated.  Patient appears dehydrated.  Last echo 03/2021, EF of 50 to 55%. -Hold 20 mg Lasix for now

## 2022-04-17 NOTE — ED Triage Notes (Signed)
Pt brought in by [facility] for c/o ams; pt's [place] nurse went to his house to check on pt and found him in the floor, unsure of fall due to pt sometimes sleeps on the floor; pt is unable to carry on a conversation  Pt has purple color to bilateral lower extremities  Last known well is 2pm yesterday  Cbg with ems 172  Family reported to ems that pt's medications were gone for all day today and he is supposed to take them 4 times a day

## 2022-04-17 NOTE — ED Notes (Signed)
Narcan administer, no positive response. Pt does endorse that he is sleepy, multiple yawing noted, pt did get irritable when staff did a complete linen change d/t urinating on self around male purwick. Dr. Mayra Neer notified

## 2022-04-17 NOTE — ED Notes (Signed)
Radiology came to pick up patient for CT (head) and chest x-ray. Pt's son refused for them to take him stating "he already had those done last week when he was here." Notified Dr. Mayra Neer.

## 2022-04-17 NOTE — ED Provider Notes (Signed)
[place] EMERGENCY DEPARTMENT AT [facility] Provider Note   CSN: [id number] Arrival date & time: 04/17/22  1605     History  Chief Complaint  Patient presents with   Altered Mental Status    Wayne Middleton is a 82 y.o. male with Afib, DM, HTN,  presents with AMS.   Per chart review, patient was recently admitted from 04/09/22-04/12/22 for PNA, chronic Afib, AKI, chronic diastolic CHF, and AMS.  Initially presented with chest pain after a fall, patient lives by himself but is cared for at times by his sons.  Son noted increased heart rate a couple of days and then began to act confused.  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  Patient was discharged on p.o. Levaquin.  Today, patient is [facility] for AMS. Pt's HH nurse went to his house to check on pt and found him in the floor, unsure of fall due to pt sometimes sleeps on the floor. Last known well is 2pm yesterday. Cbg with ems 172 mg/dL. Son reported to ems that pt's medications were gone for all day today and he is supposed to take them 4 times a day.  On my evaluation patient is resting with his eyes closed, GCS 13, seems almost intoxicated.  He will awake with voice or pain and follow commands intermittently.  He is unable to provide any history and is ANO x 2 not to time or situation.  He denies any pain anywhere, states he has not been ill recently, denies shortness of breath or urinary symptoms.  After initial evaluation, son does show up at bedside.  He states that patient was doing better on his discharge from the hospital.  And that he became confused again today.  He was not this confused when he was admitted the hospital last time, this is worse than before.    Altered Mental Status      Home  Medications Prior to Admission medications   Medication Sig Start Date End Date Taking? Authorizing Provider  allopurinol (ZYLOPRIM) 300 MG tablet Take 300 mg by mouth daily. 07/24/21   [provider]  atenolol (TENORMIN) 100 MG tablet Take 100 mg by mouth 2 (two) times daily. 08/14/20   [provider]  cetirizine (ZYRTEC) 10 MG tablet Take 10 mg by mouth daily. 12/20/20   [provider]  diazepam (VALIUM) 10 MG tablet SMARTSIG:0.5 Tablet(s) By Mouth 1-2 Times Daily Patient not taking: Reported on 04/09/2022 10/22/21   [provider]  diltiazem (CARDIZEM CD) 240 MG 24 hr capsule Take 1 capsule (240 mg total) by mouth daily. 02/15/21   Jonelle Sidle, MD  diphenoxylate-atropine (LOMOTIL) 2.5-0.025 MG tablet Take by mouth. 02/20/22   [provider]  FLUoxetine (PROZAC) 20 MG capsule Take 20 mg by mouth daily. 08/28/21   [provider]  fluticasone (FLONASE) 50 MCG/ACT nasal spray Place into both nostrils. 03/20/22   [provider]  furosemide (LASIX) 20 MG tablet Take 1 tablet (20 mg total) by mouth daily. 04/12/22 04/12/23  Kendell Bane, MD  nystatin-triamcinolone (MYCOLOG II) cream Apply topically 2 (two) times daily. 03/26/22   [provider]  oxycodone (ROXICODONE) 30 MG immediate release tablet Take 15 mg by mouth 3 (three) times daily as needed for pain. 09/18/21  [provider]  potassium chloride (MICRO-K) 10 MEQ CR capsule Take 1 capsule (10 mEq total) by mouth daily. 04/12/22 06/11/22  Shahmehdi, Valeria Batman, MD  pregabalin (LYRICA) 300 MG capsule Take 300 mg by mouth 4 (four) times daily. 09/29/21   [provider]  warfarin (COUMADIN) 5 MG tablet Take 5 mg by mouth daily. 09/20/21   [provider]      Allergies    Patient has no known allergies.    Review of Systems   Review of Systems  Unable to perform ROS: Mental status change    Physical Exam Updated Vital Signs BP (!) 188/162    Pulse 82   Temp 98.6 F (37 C) (Oral)   Resp (!) 23   Ht 6' (1.829 m)   Wt 100.7 kg   SpO2 97%   BMI 30.11 kg/m  Physical Exam General: Sleepy appearing male, uncooperative. HEENT: PERRLA 44mm bilaterally symmetric, EOMI, Sclera anicteric, MMM, trachea midline. NCAT, stable forehead, midface, nasal bridge.  Cardiology: Irregular rate, no murmurs/rubs/gallops. BL radial and DP pulses equal bilaterally.  Resp: Normal respiratory rate and effort. CTAB, no wheezes, rhonchi, crackles.  Abd: Soft, non-tender, non-distended. No rebound tenderness or guarding.  GU: Deferred. MSK: No peripheral edema or signs of trauma. Extremities without deformity or TTP. No cyanosis or clubbing. Soft compartments. Skin: warm, dry. No rashes or lesions. Back: No CVA tenderness Neuro: A&Ox2 (not to time or situation), CNs II-XII grossly intact. MAEs. Sensation grossly intact.  Psych: Altered affect  ED Results / Procedures / Treatments   Labs (all labs ordered are listed, but only abnormal results are displayed) Labs Reviewed  COMPREHENSIVE METABOLIC PANEL - Abnormal; Notable for the following components:      Result Value   Glucose, Bld 122 (*)    BUN 25 (*)    Creatinine, Ser 1.33 (*)    Calcium 8.4 (*)    Albumin 3.0 (*)    Alkaline Phosphatase 136 (*)    GFR, Estimated 53 (*)    All other components within normal limits  CBC - Abnormal; Notable for the following components:   Hemoglobin 11.0 (*)    HCT 37.1 (*)    MCV 75.9 (*)    MCH 22.5 (*)    MCHC 29.6 (*)    RDW 17.3 (*)    All other components within normal limits  PROTIME-INR - Abnormal; Notable for the following components:   Prothrombin Time 37.8 (*)    INR 3.9 (*)    All other components within normal limits  ACETAMINOPHEN LEVEL - Abnormal; Notable for the following components:   Acetaminophen (Tylenol), Serum <10 (*)    All other components within normal limits  SALICYLATE LEVEL - Abnormal; Notable for the following  components:   Salicylate Lvl <4.0 (*)    All other components within normal limits  RAPID URINE DRUG SCREEN, HOSP PERFORMED - Abnormal; Notable for the following components:   Opiates POSITIVE (*)    Benzodiazepines POSITIVE (*)    All other components within normal limits  URINALYSIS, ROUTINE W REFLEX MICROSCOPIC - Abnormal; Notable for the following components:   APPearance TURBID (*)    Bacteria, UA RARE (*)    All other components within normal limits  BLOOD GAS, VENOUS - Abnormal; Notable for the following components:   pH, Ven 7.5 (*)    pCO2, Ven 43 (*)    Bicarbonate 33.5 (*)    Acid-Base Excess 9.3 (*)    All other  components within normal limits  CBG MONITORING, ED - Abnormal; Notable for the following components:   Glucose-Capillary 110 (*)    All other components within normal limits  RESP PANEL BY RT-PCR (RSV, FLU A&B, COVID)  RVPGX2  ETHANOL  LACTIC ACID, PLASMA  AMMONIA  CK  CBG MONITORING, ED    EKG EKG Interpretation  Date/Time:  Wednesday April 17 2022 16:27:50 EST Ventricular Rate:  89 PR Interval:  54 QRS Duration: 123 QT Interval:  402 QTC Calculation: 443 R Axis:   118 Text Interpretation: Atrial fibrillation Nonspecific intraventricular conduction delay Baseline artifact in most leads Confirmed by Cindee Lame [phone]) on 04/17/2022 5:59:48 PM  Radiology DG Chest 2 View  Result Date: 04/17/2022 CLINICAL DATA:  Recent pneumonia, now with altered mental status. EXAM: CHEST - 2 VIEW COMPARISON:  Chest radiograph dated 10/13/2021 and CT chest dated 04/09/2022. FINDINGS: The heart is enlarged. Vascular calcifications are seen in the aortic arch. There is mild bibasilar atelectasis. No pleural effusion or pneumothorax. Degenerative changes are seen in the spine. IMPRESSION: 1. Mild bibasilar atelectasis. 2. Cardiomegaly. Electronically Signed   By: Zerita Boers M.D.   On: 04/17/2022 19:47   CT Head Wo Contrast  Result Date: 04/17/2022 CLINICAL DATA:   Altered mental status, found down. EXAM: CT HEAD WITHOUT CONTRAST TECHNIQUE: Contiguous axial images were obtained from the base of the skull through the vertex without intravenous contrast. RADIATION DOSE REDUCTION: This exam was performed according to the departmental dose-optimization program which includes automated exposure control, adjustment of the mA and/or kV according to patient size and/or use of iterative reconstruction technique. COMPARISON:  CT head dated 04/09/2022. FINDINGS: Brain: No evidence of acute infarction, hemorrhage, hydrocephalus, extra-axial collection or mass lesion/mass effect. There is mild cerebral volume loss with associated ex vacuo dilatation. Periventricular white matter hypoattenuation likely represents chronic small vessel ischemic disease. Vascular: There are vascular calcifications in the carotid siphons. Skull: Normal. Negative for fracture or focal lesion. Sinuses/Orbits: No acute finding. Other: None. IMPRESSION: No acute intracranial process. Electronically Signed   By: Zerita Boers M.D.   On: 04/17/2022 19:45    Procedures Procedures    Medications Ordered in ED Medications  naloxone [facility]) injection 0.4 mg (0.4 mg Intravenous Given 04/17/22 2122)    ED Course/ Medical Decision Making/ A&P                          Medical Decision Making Amount and/or Complexity of Data Reviewed Labs: ordered. Decision-making details documented in ED Course. Radiology: ordered. Decision-making details documented in ED Course.  Risk Prescription drug management. Decision regarding hospitalization.    This patient presents to the ED for concern of AMS, this involves an extensive number of treatment options, and is a complaint that carries with it a high risk of complications and morbidity.  I considered the following differential and admission for this acute, potentially life threatening condition.   MDM:    Ddx of acute altered mental status or encephalopathy  considered but not limited to: -Intracranial abnormalities such as ICH, hydrocephalus, head trauma - unknown if trauma, will get CTH -Infection such as UTI, PNA - will obtain UA and repeat CXR given patient's recent admission for PNA. Son at bedside states he took all of his prescribed abx.  -Toxic ingestion such as opioid overdose, benzos OD, EtOH, or polypharmacy- pupils are 82mm midrange though patient does have oxycodone and valium listed among home meds - no respiratory depression  or pinpoint pupils currently.  -Electrolyte abnormalities or hyper/hypoglycemia -Hypercarbia or hypoxia -Hepatic encephalopathy or uremia -ACS or arrhythmia   Clinical Course as of 04/17/22 2214  Wed Apr 17, 2022  1718 Glucose-Capillary(!): 110 [HN]  1744 Acetaminophen (Tylenol), S(!): <10 [HN]  1744 Alcohol, Ethyl (B): <84 [HN]  6962 Salicylate Lvl(!): <9.5 [HN]  1745 Creatinine(!): 1.33 AKI [HN]  1745 INR(!): 3.9 [HN]  1745 WBC: 7.2 No leukocytosis [HN]  1815 Family refused CT scan and CXR [HN]  1834 Ammonia: 14 [HN]  1834 Lactic Acid, Venous: 1.3 [HN]  1959 Resp panel by RT-PCR (RSV, Flu A&B, Covid) Anterior Nasal Swab Neg [HN]  2050 DG Chest 2 View IMPRESSION: 1. Mild bibasilar atelectasis. 2. Cardiomegaly.   [HN]  2051 CT Head Wo Contrast IMPRESSION: No acute intracranial process.   [HN]  2051 Opiates(!): POSITIVE Patient does have that oxy listed on home meds, but no pinpoint pupils or resp depression. Could be polypharmacy causing AMS with +benzos as well and possibly taking more home meds than prescribed. Will give narcan and assess response. [HN]  2052 Urinalysis, Routine w reflex microscopic -Urine, Catheterized(!) No UTI [HN]  2133 pCO2, Ven(!): 43 [HN]  2133 pH, Ven(!): 7.5 Mild respiratory alkalosis [HN]  2212 No response with 0.4 mg IV of Narcan.  Patient's lab workup largely unremarkable except for UDS positive for opiates and benzodiazepines and mild AKI.  Possibly polypharmacy.   Discussed case with hospitalist will admit for further workup and management and neurochecks.  Called to update son who will come to see patient tomorrow. [HN]    Clinical Course User Index [HN] Audley Hose, MD    Labs: I Ordered, and personally interpreted labs.  The pertinent results include:  those listed above  Imaging Studies ordered: I ordered imaging studies including CTH, CXR I independently visualized and interpreted imaging. I agree with the radiologist interpretation  Additional history obtained from son, chart review.   Cardiac Monitoring: The patient was maintained on a cardiac monitor.  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)     Other   AMS (altered mental status) - Primary   Other Visit Diagnoses     Confusion                       This note was created using dictation software, which may  contain spelling or  grammatical errors.    Audley Hose, MD 04/17/22 2214

## 2022-04-17 NOTE — Assessment & Plan Note (Addendum)
Currently quite somnolent, occasionally open his eyes to vigorous touch, localizing pain.  Exact etiology undetermined.  Possible polypharmacy/drug overdose.  Home medications include Valium, oxycodone, atenolol, diltiazem, Prozac, Lyrica, warfarin, Lasix.  No response to 0.4 Narcan.  INR is elevated at 3.9.  Blood pressure heart rate stable.  Head CT negative for acute abnormality.  He is positive for benzos and opiates.  Recent hospitalization, treated for pneumonia, discharged on Levaquin. Quinolones can cos alteration in mental status, but I doubt to this level. -VBG shows pH of 7.5, pCO2 of 43 -Daily PT/INR -if continues to increase then AMS likely from medications -Admit to stepdown -EKG showing atrial fibrillation, stable QTc. -Repeat EKG in a.m. -N.p.o. for now - D5 N/s 75cc/hr x 20hrs - Check CK - BID CBG while NPO -If no improvement in the status/pending clinical course, may need to consider MRI

## 2022-04-17 NOTE — Assessment & Plan Note (Addendum)
Rate controlled on anticoagulation with warfarin. -Hold Atenolol and diltiazem for now -INR supratherapeutic at 3.9, monitor daily -Hold warfarin

## 2022-04-17 NOTE — ED Notes (Signed)
Attempted to call report to ICU, the receiving RN will need to call back for report

## 2022-04-17 NOTE — ED Notes (Signed)
Son Marguerite Olea on phone, sent to provider for update of pt's status

## 2022-04-18 ENCOUNTER — Inpatient Hospital Stay (HOSPITAL_COMMUNITY)
Admit: 2022-04-18 | Discharge: 2022-04-18 | Disposition: A | Discharge status: Home / Self Care | Payer: Medicare HMO | Attending: Internal Medicine | Admitting: Internal Medicine

## 2022-04-18 DIAGNOSIS — I4821 Permanent atrial fibrillation: Secondary | ICD-10-CM | POA: Diagnosis present

## 2022-04-18 DIAGNOSIS — K7682 Hepatic encephalopathy: Secondary | ICD-10-CM | POA: Diagnosis present

## 2022-04-18 DIAGNOSIS — F32A Depression, unspecified: Secondary | ICD-10-CM | POA: Diagnosis present

## 2022-04-18 DIAGNOSIS — E1142 Type 2 diabetes mellitus with diabetic polyneuropathy: Secondary | ICD-10-CM | POA: Diagnosis present

## 2022-04-18 DIAGNOSIS — R4182 Altered mental status, unspecified: Secondary | ICD-10-CM

## 2022-04-18 DIAGNOSIS — R410 Disorientation, unspecified: Secondary | ICD-10-CM | POA: Diagnosis present

## 2022-04-18 DIAGNOSIS — N179 Acute kidney failure, unspecified: Secondary | ICD-10-CM | POA: Diagnosis present

## 2022-04-18 DIAGNOSIS — E873 Alkalosis: Secondary | ICD-10-CM | POA: Diagnosis present

## 2022-04-18 DIAGNOSIS — F419 Anxiety disorder, unspecified: Secondary | ICD-10-CM | POA: Diagnosis present

## 2022-04-18 DIAGNOSIS — I482 Chronic atrial fibrillation, unspecified: Secondary | ICD-10-CM | POA: Diagnosis not present

## 2022-04-18 DIAGNOSIS — E860 Dehydration: Secondary | ICD-10-CM | POA: Diagnosis present

## 2022-04-18 DIAGNOSIS — Z8249 Family history of ischemic heart disease and other diseases of the circulatory system: Secondary | ICD-10-CM | POA: Diagnosis not present

## 2022-04-18 DIAGNOSIS — Z1152 Encounter for screening for COVID-19: Secondary | ICD-10-CM | POA: Diagnosis not present

## 2022-04-18 DIAGNOSIS — Z7901 Long term (current) use of anticoagulants: Secondary | ICD-10-CM | POA: Diagnosis not present

## 2022-04-18 DIAGNOSIS — I5032 Chronic diastolic (congestive) heart failure: Secondary | ICD-10-CM | POA: Diagnosis present

## 2022-04-18 DIAGNOSIS — Z79899 Other long term (current) drug therapy: Secondary | ICD-10-CM | POA: Diagnosis not present

## 2022-04-18 DIAGNOSIS — R791 Abnormal coagulation profile: Secondary | ICD-10-CM | POA: Diagnosis present

## 2022-04-18 DIAGNOSIS — G9341 Metabolic encephalopathy: Secondary | ICD-10-CM | POA: Diagnosis present

## 2022-04-18 DIAGNOSIS — W19XXXA Unspecified fall, initial encounter: Secondary | ICD-10-CM | POA: Diagnosis present

## 2022-04-18 DIAGNOSIS — I110 Hypertensive heart disease with heart failure: Secondary | ICD-10-CM | POA: Diagnosis present

## 2022-04-18 DIAGNOSIS — F1120 Opioid dependence, uncomplicated: Secondary | ICD-10-CM | POA: Diagnosis present

## 2022-04-18 LAB — CBC
HCT: 36.1 % — ABNORMAL LOW (ref 39.0–52.0)
Hemoglobin: 10.9 g/dL — ABNORMAL LOW (ref 13.0–17.0)
MCH: 22.4 pg — ABNORMAL LOW (ref 26.0–34.0)
MCHC: 30.2 g/dL (ref 30.0–36.0)
MCV: 74.1 fL — ABNORMAL LOW (ref 80.0–100.0)
Platelets: 193 10*3/uL (ref 150–400)
RBC: 4.87 MIL/uL (ref 4.22–5.81)
RDW: 17.5 % — ABNORMAL HIGH (ref 11.5–15.5)
WBC: 5.5 10*3/uL (ref 4.0–10.5)
nRBC: 0 % (ref 0.0–0.2)

## 2022-04-18 LAB — BASIC METABOLIC PANEL
Anion gap: 9 (ref 5–15)
BUN: 19 mg/dL (ref 8–23)
CO2: 27 mmol/L (ref 22–32)
Calcium: 8.3 mg/dL — ABNORMAL LOW (ref 8.9–10.3)
Chloride: 103 mmol/L (ref 98–111)
Creatinine, Ser: 0.95 mg/dL (ref 0.61–1.24)
GFR, Estimated: >60 mL/min (ref >60)
Glucose, Bld: 129 mg/dL — ABNORMAL HIGH (ref 70–99)
Potassium: 3.3 mmol/L — ABNORMAL LOW (ref 3.5–5.1)
Sodium: 139 mmol/L (ref 135–145)

## 2022-04-18 LAB — VITAMIN B12: Vitamin B-12: 371 pg/mL (ref 180–914)

## 2022-04-18 LAB — FOLATE: Folate: 37.7 ng/mL (ref >5.9)

## 2022-04-18 LAB — GLUCOSE, CAPILLARY
Glucose-Capillary: 126 mg/dL — ABNORMAL HIGH (ref 70–99)
Glucose-Capillary: 211 mg/dL — ABNORMAL HIGH (ref 70–99)
Glucose-Capillary: 110 mg/dL — ABNORMAL HIGH (ref 70–99)
Glucose-Capillary: 184 mg/dL — ABNORMAL HIGH (ref 70–99)

## 2022-04-18 LAB — PROTIME-INR
INR: 4.5 (ref 0.8–1.2)
Prothrombin Time: 42.6 seconds — ABNORMAL HIGH (ref 11.4–15.2)

## 2022-04-18 LAB — MRSA NEXT GEN BY PCR, NASAL: MRSA by PCR Next Gen: NOT DETECTED

## 2022-04-18 LAB — TSH: TSH: 0.411 u[IU]/mL (ref 0.350–4.500)

## 2022-04-18 LAB — T4, FREE: Free T4: 1.06 ng/dL (ref 0.61–1.12)

## 2022-04-18 MED ORDER — OXYCODONE HCL 5 MG PO TABS
15.0000 mg | ORAL_TABLET | Freq: Three times a day (TID) | ORAL | Status: DC
  Administered 2022-04-19 (×2): 15 mg via ORAL
  Filled 2022-04-18 (×3): qty 3

## 2022-04-18 MED ORDER — INSULIN ASPART 100 UNIT/ML IJ SOLN
0.0000 [IU] | Freq: Three times a day (TID) | INTRAMUSCULAR | Status: DC
  Administered 2022-04-18: 3 [IU] via SUBCUTANEOUS

## 2022-04-18 MED ORDER — POTASSIUM CHLORIDE CRYS ER 20 MEQ PO TBCR
20.0000 meq | EXTENDED_RELEASE_TABLET | Freq: Once | ORAL | Status: AC
  Administered 2022-04-18: 20 meq via ORAL
  Filled 2022-04-18: qty 1

## 2022-04-18 MED ORDER — FLUOXETINE HCL 20 MG PO CAPS
20.0000 mg | ORAL_CAPSULE | Freq: Every day | ORAL | Status: DC
  Administered 2022-04-18 – 2022-04-19 (×2): 20 mg via ORAL
  Filled 2022-04-18 (×2): qty 1

## 2022-04-18 MED ORDER — WARFARIN - PHARMACIST DOSING INPATIENT: Freq: Every day | Status: DC

## 2022-04-18 MED ORDER — DILTIAZEM HCL ER COATED BEADS 120 MG PO CP24
240.0000 mg | ORAL_CAPSULE | Freq: Every day | ORAL | Status: DC
  Administered 2022-04-18 – 2022-04-19 (×2): 240 mg via ORAL
  Filled 2022-04-18: qty 2
  Filled 2022-04-18: qty 1

## 2022-04-18 MED ORDER — POTASSIUM CHLORIDE IN NACL 20-0.9 MEQ/L-% IV SOLN: INTRAVENOUS | Status: AC

## 2022-04-18 NOTE — NC FL2 (Signed)
[place] LEVEL OF CARE FORM     IDENTIFICATION  Patient Name: Wayne Middleton Birthdate: 04-Feb-1941 Sex: male Admission Date (Current Location): 04/17/2022  [facility] and [place] Number:  [employer] and Address:  [facility] [address] [place]      Provider Number: [id number]  Attending Physician Name and Address:  Orson Eva, MD  Relative Name and Phone Number:  Layne, Dilauro (Son) [phone]    Current Level of Care: Hospital Recommended Level of Care: [place] [facility] Prior Approval Number:    Date Approved/Denied:   PASRR Number: [id number]  Discharge Plan: SNF    Current Diagnoses: Patient Active Problem List   Diagnosis Date Noted   Permanent atrial fibrillation ([place]) 09/01/7626   Acute metabolic encephalopathy 31/51/7616   Pneumonia 04/10/2022   Acute respiratory failure ([place]) 04/09/2022   CAP (community acquired pneumonia) 04/09/2022   AKI (acute kidney injury) ([place]) 04/09/2022   AMS (altered mental status) 04/09/2022   Chronic diastolic CHF (congestive heart failure) ([place]) 04/09/2022   Peripheral neuropathy 11/11/2018   History of cardiomyopathy 11/03/2013   Chronic atrial fibrillation with RVR (HCC)    Chronic anticoagulation    Essential hypertension, benign     Orientation RESPIRATION BLADDER Height & Weight     Self, Situation, Place  Normal Continent Weight: 98.8 kg Height:  6' (182.9 cm)  BEHAVIORAL SYMPTOMS/MOOD NEUROLOGICAL BOWEL NUTRITION STATUS      Continent Diet (See DC summary)  AMBULATORY STATUS COMMUNICATION OF NEEDS Skin   Extensive Assist Verbally Normal                       Personal Care Assistance Level of Assistance  Bathing, Feeding, Dressing Bathing Assistance: Maximum assistance Feeding assistance: Limited assistance Dressing Assistance: Maximum assistance     Functional Limitations Info  Sight, Hearing, Speech Sight Info: Impaired Hearing Info:  Adequate Speech Info: Adequate    SPECIAL CARE FACTORS FREQUENCY  PT (By licensed PT)     PT Frequency: 5 times a week              Contractures Contractures Info: Not present    Additional Factors Info  Code Status, Allergies Code Status Info: FULL Allergies Info: NKDA           Current Medications (04/18/2022):  This is the current hospital active medication list Current Facility-Administered Medications  Medication Dose Route Frequency Provider Last Rate Last Admin   0.9 % NaCl with KCl 20 mEq/ L  infusion   Intravenous Continuous Tat, David, MD 50 mL/hr at 04/18/22 0908 New Bag at 04/18/22 0908   acetaminophen (TYLENOL) tablet 650 mg  650 mg Oral Q6H PRN Tat, Shanon Brow, MD   650 mg at 04/18/22 1454   Or   acetaminophen (TYLENOL) suppository 650 mg  650 mg Rectal Q6H PRN Tat, Shanon Brow, MD       Chlorhexidine Gluconate Cloth 2 % PADS 6 each  6 each Topical Daily Tat, David, MD   6 each at 04/18/22 0912   diltiazem (CARDIZEM CD) 24 hr capsule 240 mg  240 mg Oral Daily Tat, David, MD   240 mg at 04/18/22 0907   FLUoxetine (PROZAC) capsule 20 mg  20 mg Oral Daily Tat, David, MD   20 mg at 04/18/22 0907   insulin aspart (novoLOG) injection 0-9 Units  0-9 Units Subcutaneous TID WC Orson Eva, MD   3 Units at 04/18/22 1202  ondansetron (ZOFRAN) tablet 4 mg  4 mg Oral Q6H PRN Tat, David, MD   4 mg at 04/18/22 1454   Or   ondansetron (ZOFRAN) injection 4 mg  4 mg Intravenous Q6H PRN Tat, Shanon Brow, MD       polyethylene glycol (MIRALAX / GLYCOLAX) packet 17 g  17 g Oral Daily PRN Orson Eva, MD       Warfarin - Pharmacist Dosing Inpatient   Does not apply q1600 [place], [place], [facility]         Discharge Medications: Please see discharge summary for a list of discharge medications.  Relevant Imaging Results:  Relevant Lab Results:   Additional Information SS# [id number]  Boneta Lucks, RN

## 2022-04-18 NOTE — Progress Notes (Addendum)
PROGRESS NOTE  Wayne Middleton GNF:[id number] DOB: 05-14-1940 DOA: 04/17/2022 PCP: Gareth Morgan, MD  Brief History:  82 year old male with a history of diabetes mellitus type 2, hypertension, permanent atrial fibrillation, tachycardia mediated cardiomyopathy, anxiety, peripheral neuropathy presenting with altered mental status.  The patient lives by himself, and his sons usually check in on him.  Apparently, one of the patient's sons found him lying in the hallway minimally responsive on 04/17/2022.  His last known well was 2 PM on 04/16/2022.  CBG was 172. Notably, the patient was initially admitted to the hospital from 03/13/2022 to 04/12/2022 when he was treated for pneumonia.  He was treated with ceftriaxone and azithromycin during hospitalization and discharged home with levofloxacin.  He had atrial fibrillation and was discharged home with Cardizem CD 240 mg daily.  His acute kidney injury improved with IV fluids.  PT recommended skilled nursing facility, but patient/family refused and the patient was discharged home with home health. Apparently, there is some concern with the patient taking extra oxycodone. UDS in the emergency department showed benzodiazepines, but review of PDMP did not show any benzodiazepines.  Patient's son stated that the patient has a pillbox, but all his medications including his controlled substances were gone for the day.  Son notes that the patient has had decreased oral intake for the last few days.  There is been no reports of chest pain, shortness breath, vomiting, diarrhea. In the ED, the patient had low-grade temperature of 99.7 F.  He was hemodynamically stable with oxygen saturation 94% on room air.  WBC 7.2, hemoglobin 11.0, platelets 193,000.  Sodium 136, potassium 3.5, bicarbonate 27, serum creatinine 1.33.  LFTs were unremarkable.  CPK 139.  Ammonia 14.  COVID PCR negative.  INR 3.9.  EKG showed atrial fibrillation with nonspecific T wave  change.  Assessment/Plan: Acute metabolic encephalopathy -Multifactorial including polypharmacy, AKI -Check orthostatics -Judicious opioids -Patient had benzodiazepines in his UDS, but there is no benzodiazepines on review of his PDMP -UA negative for pyuria -EEG -B12 -Folic acid -TSH -CT brain neg -2/8--not completely back to baseline  Permanent atrial fibrillation -Rate controlled -Pharmacy assisting with warfarin dosing -continue cardizem CD  Diabetes mellitus type 2, controlled -04/09/2022 hemoglobin A1c 6.7 -NovoLog sliding scale  Acute kidney injury -Baseline creatinine 0.8-1.0 -Presenting serum creatinine 1.33 -Improving with IV fluids>>continue IVF  Opioid dependence -PDMP reviewed--oxycodone 20 mg, #90, last refill 03/19/2022 -Lyrica 300 mg, #120 last refill 03/22/2022 -Ambien 10 mg, #30, last refill 03/19/2022  Depression/anxiety -Restart fluoxetine  Essential hypertension -Restart Cardizem CD -Restart lower dose atenolol if BP/HR remains elevated  Chronic diastolic CHF -clinically euvolemic -03/20/21 Echo EF 50-55%, no WMA, mild-mod AI    Family Communication:  son updated 2/8  Consultants:  none  Code Status:  FULL   DVT Prophylaxis:  warfarin   Procedures: As Listed in Progress Note Above  Antibiotics: None       Subjective: Patient denies fevers, chills, headache, chest pain, dyspnea, nausea, vomiting, diarrhea, abdominal pain, dysuria, hematuria, hematochezia, and melena.   Objective: Vitals:   04/17/22 2300 04/18/22 0400 04/18/22 0500 04/18/22 0600  BP:  (!) 128/54 (!) 143/73 (!) 142/77  Pulse: 74 91 92 84  Resp: 19 (!) 21 (!) 24 (!) 26  Temp: 97.7 F (36.5 C)     TempSrc: Oral     SpO2: 98% 91% 94% 94%  Weight: 98.8 kg     Height: 6' (1.829 m)  Intake/Output Summary (Last 24 hours) at 04/18/2022 0756 Last data filed at 04/18/2022 0731 Gross per 24 hour  Intake 240 ml  Output 650 ml  Net -410 ml   Weight change:   Exam:  General:  Pt is alert, follows commands appropriately, not in acute distress HEENT: No icterus, No thrush, No neck mass, [place]/AT Cardiovascular: RRR, S1/S2, no rubs, no gallops Respiratory: bibasilar rales. No wheeze Abdomen: Soft/+BS, non tender, non distended, no guarding Extremities: No edema, No lymphangitis, No petechiae, No rashes, no synovitis   Data Reviewed: I have personally reviewed following labs and imaging studies Basic Metabolic Panel: Recent Labs  Lab 04/12/22 0353 04/17/22 1637 04/18/22 0420  NA 133* 136 139  K 3.3* 3.5 3.3*  CL 100 100 103  CO2 28 27 27   GLUCOSE 119* 122* 129*  BUN 18 25* 19  CREATININE 1.01 1.33* 0.95  CALCIUM 8.3* 8.4* 8.3*   Liver Function Tests: Recent Labs  Lab 04/17/22 1637  AST 27  ALT 22  ALKPHOS 136*  BILITOT 0.9  PROT 6.5  ALBUMIN 3.0*   No results for input(s): "LIPASE", "AMYLASE" in the last 168 hours. Recent Labs  Lab 04/17/22 1648  AMMONIA 14   Coagulation Profile: Recent Labs  Lab 04/12/22 0353 04/17/22 1637  INR 2.4* 3.9*   CBC: Recent Labs  Lab 04/12/22 0822 04/17/22 1637 04/18/22 0420  WBC 7.5 7.2 5.5  HGB 13.1 11.0* 10.9*  HCT 43.0 37.1* 36.1*  MCV 74.9* 75.9* 74.1*  PLT 206 200 193   Cardiac Enzymes: Recent Labs  Lab 04/17/22 2113  CKTOTAL 139   BNP: Invalid input(s): "POCBNP" CBG: Recent Labs  Lab 04/11/22 1733 04/11/22 2136 04/12/22 0755 04/17/22 1644 04/18/22 0728  GLUCAP 161* 126* 119* 110* 126*   HbA1C: No results for input(s): "HGBA1C" in the last 72 hours. Urine analysis:    Component Value Date/Time   COLORURINE YELLOW 04/17/2022 2005   APPEARANCEUR TURBID (A) 04/17/2022 2005   LABSPEC 1.018 04/17/2022 2005   PHURINE 5.0 04/17/2022 2005   GLUCOSEU NEGATIVE 04/17/2022 2005   HGBUR NEGATIVE 04/17/2022 2005   BILIRUBINUR NEGATIVE 04/17/2022 2005   KETONESUR NEGATIVE 04/17/2022 2005   PROTEINUR NEGATIVE 04/17/2022 2005   NITRITE NEGATIVE 04/17/2022 2005    LEUKOCYTESUR NEGATIVE 04/17/2022 2005   Sepsis Labs: @LABRCNTIP (procalcitonin:4,lacticidven:4) ) Recent Results (from the past 240 hour(s))  Resp panel by RT-PCR (RSV, Flu A&B, Covid) Anterior Nasal Swab     Status: None   Collection Time: 04/17/22  6:27 PM   Specimen: Anterior Nasal Swab  Result Value Ref Range Status   SARS Coronavirus 2 by RT PCR NEGATIVE NEGATIVE Final    Comment: (NOTE) SARS-CoV-2 target nucleic acids are NOT DETECTED.  The SARS-CoV-2 RNA is generally detectable in upper respiratory specimens during the acute phase of infection. The lowest concentration of SARS-CoV-2 viral copies this assay can detect is 138 copies/mL. A negative result does not preclude SARS-Cov-2 infection and should not be used as the sole basis for treatment or other patient management decisions. A negative result may occur with  improper specimen collection/handling, submission of specimen other than nasopharyngeal swab, presence of viral mutation(s) within the areas targeted by this assay, and inadequate number of viral copies(<138 copies/mL). A negative result must be combined with clinical observations, patient history, and epidemiological information. The expected result is Negative.  Fact Sheet for Patients:   Fact Sheet for Healthcare Providers:  06/16/22  This test is no t yet approved or cleared by  the [employer] and  has been authorized for detection and/or diagnosis of SARS-CoV-2 by FDA under an Emergency Use Authorization (EUA). This EUA will remain  in effect (meaning this test can be used) for the duration of the COVID-19 declaration under Section 564(b)(1) of the Act, 21 U.S.C.section 360bbb-3(b)(1), unless the authorization is terminated  or revoked sooner.       Influenza A by PCR NEGATIVE NEGATIVE Final   Influenza B by PCR NEGATIVE NEGATIVE Final    Comment: (NOTE) The Xpert  Xpress SARS-CoV-2/FLU/RSV plus assay is intended as an aid in the diagnosis of influenza from Nasopharyngeal swab specimens and should not be used as a sole basis for treatment. Nasal washings and aspirates are unacceptable for Xpert Xpress SARS-CoV-2/FLU/RSV testing.  Fact Sheet for Patients: EntrepreneurPulse.com.au  Fact Sheet for Healthcare Providers: IncredibleEmployment.be  This test is not yet approved or cleared by the [place] FDA and has been authorized for detection and/or diagnosis of SARS-CoV-2 by FDA under an Emergency Use Authorization (EUA). This EUA will remain in effect (meaning this test can be used) for the duration of the COVID-19 declaration under Section 564(b)(1) of the Act, 21 U.S.C. section 360bbb-3(b)(1), unless the authorization is terminated or revoked.     Resp Syncytial Virus by PCR NEGATIVE NEGATIVE Final    Comment: (NOTE) Fact Sheet for Patients: EntrepreneurPulse.com.au  Fact Sheet for Healthcare Providers: IncredibleEmployment.be  This test is not yet approved or cleared by the [place] FDA and has been authorized for detection and/or diagnosis of SARS-CoV-2 by FDA under an Emergency Use Authorization (EUA). This EUA will remain in effect (meaning this test can be used) for the duration of the COVID-19 declaration under Section 564(b)(1) of the Act, 21 U.S.C. section 360bbb-3(b)(1), unless the authorization is terminated or revoked.  Performed at [facility], [address] [place], [place] [address]   MRSA Next Gen by PCR, Nasal     Status: None   Collection Time: 04/17/22 11:15 PM   Specimen: Nasal Mucosa; Nasal Swab  Result Value Ref Range Status   MRSA by PCR Next Gen NOT DETECTED NOT DETECTED Final    Comment: (NOTE) The GeneXpert MRSA Assay (FDA approved for NASAL specimens only), is one component of a comprehensive MRSA colonization  surveillance program. It is not intended to diagnose MRSA infection nor to guide or monitor treatment for MRSA infections. Test performance is not FDA approved in patients less than 21 years old. Performed at [facility], [address], [place], [place] [address]      Scheduled Meds:  Chlorhexidine Gluconate Cloth  6 each Topical Daily   potassium chloride  20 mEq Oral Once   Continuous Infusions:  dextrose 5 % and 0.9% NaCl 75 mL/hr at 04/17/22 2319    Procedures/Studies: DG Chest 2 View  Result Date: 04/17/2022 CLINICAL DATA:  Recent pneumonia, now with altered mental status. EXAM: CHEST - 2 VIEW COMPARISON:  Chest radiograph dated 10/13/2021 and CT chest dated 04/09/2022. FINDINGS: The heart is enlarged. Vascular calcifications are seen in the aortic arch. There is mild bibasilar atelectasis. No pleural effusion or pneumothorax. Degenerative changes are seen in the spine. IMPRESSION: 1. Mild bibasilar atelectasis. 2. Cardiomegaly. Electronically Signed   By: Zerita Boers M.D.   On: 04/17/2022 19:47   CT Head Wo Contrast  Result Date: 04/17/2022 CLINICAL DATA:  Altered mental status, found down. EXAM: CT HEAD WITHOUT CONTRAST TECHNIQUE: Contiguous axial images were obtained from the base of the skull through  the vertex without intravenous contrast. RADIATION DOSE REDUCTION: This exam was performed according to the departmental dose-optimization program which includes automated exposure control, adjustment of the mA and/or kV according to patient size and/or use of iterative reconstruction technique. COMPARISON:  CT head dated 04/09/2022. FINDINGS: Brain: No evidence of acute infarction, hemorrhage, hydrocephalus, extra-axial collection or mass lesion/mass effect. There is mild cerebral volume loss with associated ex vacuo dilatation. Periventricular white matter hypoattenuation likely represents chronic small vessel ischemic disease. Vascular: There are vascular calcifications in the carotid  siphons. Skull: Normal. Negative for fracture or focal lesion. Sinuses/Orbits: No acute finding. Other: None. IMPRESSION: No acute intracranial process. Electronically Signed   By: Romona Curls M.D.   On: 04/17/2022 19:45   MR BRAIN WO CONTRAST  Result Date: 04/09/2022 CLINICAL DATA:  Altered mental status EXAM: MRI HEAD WITHOUT CONTRAST TECHNIQUE: Multiplanar, multiecho pulse sequences of the brain and surrounding structures were obtained without intravenous contrast. COMPARISON:  None Available. FINDINGS: Brain: No acute infarct, mass effect or extra-axial collection. No chronic microhemorrhage or siderosis. There is confluent hyperintense T2-weighted signal within the white matter. Generalized volume loss. The midline structures are normal. Vascular: Abnormal right vertebral artery flow void. The other flow voids are normal. Skull and upper cervical spine: Normal marrow signal. Sinuses/Orbits: Negative. Other: None. IMPRESSION: 1. No acute intracranial abnormality. 2. Findings of chronic ischemic microangiopathy and volume loss. 3. 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   On: 04/09/2022 19:17   CT Chest Wo Contrast  Result Date: 04/09/2022 CLINICAL DATA:  Status post fall 3 days ago.  Chest pain. EXAM: CT CHEST WITHOUT CONTRAST TECHNIQUE: Multidetector CT imaging of the chest was performed following the standard protocol without IV contrast. RADIATION DOSE REDUCTION: This exam was performed according to the departmental dose-optimization program which includes automated exposure control, adjustment of the mA and/or kV according to patient size and/or use of iterative reconstruction technique. COMPARISON:  08/24/2009 FINDINGS: Cardiovascular: Multi chamber cardiac enlargement identified. No pericardial effusion. Aortic atherosclerosis and 3 vessel coronary artery calcifications. Mediastinum/Nodes: No enlarged mediastinal or axillary lymph nodes.  Thyroid gland, trachea, and esophagus demonstrate no significant findings. Lungs/Pleura: No pneumothorax, pleural fluid or signs of pulmonary contusion. There is partial atelectasis of the anterior right middle lobe with right middle lobe volume loss. Diffuse bronchial wall thickening. Mild centrilobular and paraseptal emphysema. Bilateral lower lung zone predominant peripheral interstitial reticulation, ground-glass attenuation identified. Extensive tree-in-bud nodularity is identified throughout the right middle lobe and right lower lobe. Perifissural nodule along the major fissure measures 1.2 x 0.5 cm, image 110/5. Upper Abdomen: No acute abnormality. Musculoskeletal: No chest wall mass or suspicious bone lesions identified. IMPRESSION: 1. No acute cardiopulmonary abnormalities. 2. Extensive tree-in-bud nodularity is identified throughout the right middle lobe and right lower lobe. Findings are favored to represent sequelae of chronic aspiration or infection. 3. Diffuse bronchial wall thickening with emphysema, as above; imaging findings suggestive of underlying COPD. 4. Bilateral lower lung zone predominant peripheral interstitial reticulation, ground-glass attenuation identified. Findings are nonspecific but may reflect underlying interstitial lung disease. 5. Perifissural nodule along the major fissure measures 1.2 x 0.5 cm. This most likely represents a benign intrapulmonary lymph node. 6. Age-indeterminate right middle lobe atelectasis with volume loss. 7. Aortic Atherosclerosis (ICD10-I70.0) and Emphysema (ICD10-J43.9). Electronically Signed   By: Signa Kell M.D.   On: 04/09/2022 15:23   CT Head Wo Contrast  Result Date: 04/09/2022 CLINICAL DATA:  Neck Trauma  EXAM: CT HEAD WITHOUT CONTRAST CT CERVICAL SPINE WITHOUT CONTRAST CT THORACIC SPINE WITHOUT CONTRAST TECHNIQUE: Multidetector CT imaging of the head and cervical and thoracic spine was performed following the standard protocol without  intravenous contrast. Multiplanar CT image reconstructions of the cervical spine were also generated. RADIATION DOSE REDUCTION: This exam was performed according to the departmental dose-optimization program which includes automated exposure control, adjustment of the mA and/or kV according to patient size and/or use of iterative reconstruction technique. COMPARISON:  None Available. FINDINGS: CT HEAD FINDINGS Brain: No evidence of acute infarction, hemorrhage, hydrocephalus, extra-axial collection or mass lesion/mass effect. There is sequela of moderate chronic microvascular ischemic change. 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 Vascular: No hyperdense vessel or unexpected calcification. Skull: Normal. Negative for fracture or focal lesion. Sinuses/Orbits: No mastoid or middle ear effusion. Paranasal sinuses are clear. Orbits are unremarkable. Other: None CT CERVICAL AND THORACIC SPINE FINDINGS Alignment: There is straightening of the normal cervical lordosis. Skull base and vertebrae: No acute fracture. No primary bone lesion or focal pathologic process. Soft tissues and spinal canal: No prevertebral fluid or swelling. No visible canal hematoma. Disc levels:  No evidence of high-grade spinal canal stenosis. Upper chest: Clustered nodularity in the right lobe right lower lobe, favored to be infectious or inflammatory, see same-day chest for additional findings. Other: None IMPRESSION: 1. No acute intracranial abnormality. 2. No acute fracture or traumatic malalignment of the cervical or thoracic spine. 3. Clustered nodularity in the right lower lobe, favored to be infectious or inflammatory, see same-day chest for additional findings. Electronically Signed   By: Marin Roberts M.D.   On: 04/09/2022 15:18   CT T-SPINE NO CHARGE  Result Date: 04/09/2022 CLINICAL DATA:  Neck Trauma EXAM: CT HEAD WITHOUT CONTRAST CT CERVICAL SPINE WITHOUT CONTRAST CT THORACIC SPINE WITHOUT CONTRAST  TECHNIQUE: Multidetector CT imaging of the head and cervical and thoracic spine was performed following the standard protocol without intravenous contrast. Multiplanar CT image reconstructions of the cervical spine were also generated. RADIATION DOSE REDUCTION: This exam was performed according to the departmental dose-optimization program which includes automated exposure control, adjustment of the mA and/or kV according to patient size and/or use of iterative reconstruction technique. COMPARISON:  None Available. FINDINGS: CT HEAD FINDINGS Brain: No evidence of acute infarction, hemorrhage, hydrocephalus, extra-axial collection or mass lesion/mass effect. There is sequela of moderate chronic microvascular ischemic change. 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 Vascular: No hyperdense vessel or unexpected calcification. Skull: Normal. Negative for fracture or focal lesion. Sinuses/Orbits: No mastoid or middle ear effusion. Paranasal sinuses are clear. Orbits are unremarkable. Other: None CT CERVICAL AND THORACIC SPINE FINDINGS Alignment: There is straightening of the normal cervical lordosis. Skull base and vertebrae: No acute fracture. No primary bone lesion or focal pathologic process. Soft tissues and spinal canal: No prevertebral fluid or swelling. No visible canal hematoma. Disc levels:  No evidence of high-grade spinal canal stenosis. Upper chest: Clustered nodularity in the right lobe right lower lobe, favored to be infectious or inflammatory, see same-day chest for additional findings. Other: None IMPRESSION: 1. No acute intracranial abnormality. 2. No acute fracture or traumatic malalignment of the cervical or thoracic spine. 3. Clustered nodularity in the right lower lobe, favored to be infectious or inflammatory, see same-day chest for additional findings. Electronically Signed   By: Marin Roberts M.D.   On: 04/09/2022 15:18   CT Cervical Spine Wo Contrast  Result Date:  04/09/2022 CLINICAL  DATA:  Neck Trauma EXAM: CT HEAD WITHOUT CONTRAST CT CERVICAL SPINE WITHOUT CONTRAST CT THORACIC SPINE WITHOUT CONTRAST TECHNIQUE: Multidetector CT imaging of the head and cervical and thoracic spine was performed following the standard protocol without intravenous contrast. Multiplanar CT image reconstructions of the cervical spine were also generated. RADIATION DOSE REDUCTION: This exam was performed according to the departmental dose-optimization program which includes automated exposure control, adjustment of the mA and/or kV according to patient size and/or use of iterative reconstruction technique. COMPARISON:  None Available. FINDINGS: CT HEAD FINDINGS Brain: No evidence of acute infarction, hemorrhage, hydrocephalus, extra-axial collection or mass lesion/mass effect. There is sequela of moderate chronic microvascular ischemic change. 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 Vascular: No hyperdense vessel or unexpected calcification. Skull: Normal. Negative for fracture or focal lesion. Sinuses/Orbits: No mastoid or middle ear effusion. Paranasal sinuses are clear. Orbits are unremarkable. Other: None CT CERVICAL AND THORACIC SPINE FINDINGS Alignment: There is straightening of the normal cervical lordosis. Skull base and vertebrae: No acute fracture. No primary bone lesion or focal pathologic process. Soft tissues and spinal canal: No prevertebral fluid or swelling. No visible canal hematoma. Disc levels:  No evidence of high-grade spinal canal stenosis. Upper chest: Clustered nodularity in the right lobe right lower lobe, favored to be infectious or inflammatory, see same-day chest for additional findings. Other: None IMPRESSION: 1. No acute intracranial abnormality. 2. No acute fracture or traumatic malalignment of the cervical or thoracic spine. 3. Clustered nodularity in the right lower lobe, favored to be infectious or inflammatory, see same-day chest for  additional findings. Electronically Signed   By: Marin Roberts M.D.   On: 04/09/2022 15:18    Orson Eva, DO  [facility]  If 7PM-7AM, please contact night-coverage [email or web address] Password [facility] 04/18/2022, 7:56 AM   LOS: 0 days

## 2022-04-18 NOTE — TOC Initial Note (Addendum)
Transition of Care [facility]) - Initial/Assessment Note    Patient Details  Name: Wayne Middleton MRN: [id number] Date of Birth: 11/30/1940  Transition of Care [facility]) CM/SW Contact:    Boneta Lucks, RN Phone Number: 04/18/2022, 3:17 PM  Clinical Narrative:         Patient discharged on 2/2. LIves alone, admitted with Acute metabolic encephalopathy. PT eval is pending. CM called his son. [facility] had started working with patient at home. Per son he is just to weak. He and his brother check in on him. But thinks he needs to go to SNF when medically ready for discharge. He does not want [facility].  FL2 completed and sent out. TOC will follow to sent PT eval. Son wants to discuss bed offers, he would like [facility].   Addendum : starting INS AUTH, follow to sent PT eval. DC planning for Friday.   Expected Discharge Plan: Skilled Nursing Facility Barriers to Discharge: Continued Medical Work up   Patient Goals and CMS Choice Patient states their goals for this hospitalization and ongoing recovery are:: SNF CMS Medicare.gov Compare Post Acute Care list provided to:: Patient Represenative (must comment) Choice offered to / list presented to : Adult Children      Expected Discharge Plan and Services       Living arrangements for the past 2 months: Single Family Home                    Prior Living Arrangements/Services Living arrangements for the past 2 months: Single Family Home Lives with:: Self Patient language and need for interpreter reviewed:: Yes        Need for Family Participation in Patient Care: Yes (Comment) Care giver support system in place?: Yes (comment)   Criminal Activity/Legal Involvement Pertinent to Current Situation/Hospitalization: No - Comment as needed  Activities of Daily Living     Permission Sought/Granted     Emotional Assessment     Affect (typically observed): Accepting Orientation: : Oriented to Self, Oriented to Situation, Oriented to  Place Alcohol / Substance Use: Not Applicable Psych Involvement: No (comment)  Admission diagnosis:  Confusion [R41.0] AKI (acute kidney injury) ([place]) [H73.4] Acute metabolic encephalopathy [K87.68] Glasgow coma scale total score 13-15, at arrival to emergency department [R40.2412] Patient Active Problem List   Diagnosis Date Noted   Permanent atrial fibrillation ([place]) 11/57/2620   Acute metabolic encephalopathy 35/59/7416   Pneumonia 04/10/2022   Acute respiratory failure ([place]) 04/09/2022   CAP (community acquired pneumonia) 04/09/2022   AKI (acute kidney injury) ([place]) 04/09/2022   AMS (altered mental status) 04/09/2022   Chronic diastolic CHF (congestive heart failure) ([place]) 04/09/2022   Peripheral neuropathy 11/11/2018   History of cardiomyopathy 11/03/2013   Chronic atrial fibrillation with RVR (HCC)    Chronic anticoagulation    Essential hypertension, benign    PCP:  Lemmie Evens, MD Pharmacy:   [facility], [place] [address] Phone: [phone] Fax: [phone]     Social Determinants of Health (SDOH) Social History: SDOH Screenings   Food Insecurity: No Food Insecurity (04/10/2022)  Housing: Low Risk  (04/10/2022)  Transportation Needs: No Transportation Needs (04/10/2022)  Utilities: Not At Risk (04/10/2022)  Tobacco Use: Low Risk  (04/17/2022)   SDOH Interventions:    Readmission Risk Interventions     No data to display

## 2022-04-18 NOTE — Plan of Care (Signed)
  Problem: Acute Rehab PT Goals(only PT should resolve) Goal: Pt Will Go Supine/Side To Sit Outcome: Progressing Flowsheets (Taken 04/18/2022 1559) Pt will go Supine/Side to Sit:  Independently  with modified independence Goal: Patient Will Transfer Sit To/From Stand Outcome: Progressing Flowsheets (Taken 04/18/2022 1559) Patient will transfer sit to/from stand:  with modified independence  with supervision Goal: Pt Will Transfer Bed To Chair/Chair To Bed Outcome: Progressing Flowsheets (Taken 04/18/2022 1559) Pt will Transfer Bed to Chair/Chair to Bed:  with modified independence  with supervision Goal: Pt Will Ambulate Outcome: Progressing Flowsheets (Taken 04/18/2022 1559) Pt will Ambulate:  > 125 feet  with least restrictive assistive device   4:00 PM, 04/18/22 Lonell Grandchild, MPT Physical Therapist with [facility] [phone] office [phone] mobile phone

## 2022-04-18 NOTE — Progress Notes (Signed)
ANTICOAGULATION CONSULT NOTE - Initial Consult  Pharmacy Consult for warfarin Indication: atrial fibrillation  No Known Allergies  Patient Measurements: Height: 6' (182.9 cm) Weight: 98.8 kg (217 lb 13 oz) IBW/kg (Calculated) : 77.6   Vital Signs: Temp: 97.7 F (36.5 C) (02/07 2300) Temp Source: Oral (02/07 2300) BP: 142/77 (02/08 0600) Pulse Rate: 84 (02/08 0600)  Labs: Recent Labs    04/17/22 1637 04/17/22 2113 04/18/22 0420  HGB 11.0*  --  10.9*  HCT 37.1*  --  36.1*  PLT 200  --  193  LABPROT 37.8*  --  42.6*  INR 3.9*  --  4.5*  CREATININE 1.33*  --  0.95  CKTOTAL  --  139  --     Estimated Creatinine Clearance: 73 mL/min (by C-G formula based on SCr of 0.95 mg/dL).   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 Patient is on warfarin prior to admission for afib. INR was elevated at 3.9 on admission and is up today to 4.5. CBC stable overnight, no bleeding issues noted. Will continue to hold warfarin until INR trends down.   Goal of Therapy:  INR 2-3 Monitor platelets by anticoagulation protocol: Yes   Plan:  Hold warfarin until INR trends down Daily INR  Erin Hearing PharmD., BCPS Clinical Pharmacist 04/18/2022 8:22 AM

## 2022-04-18 NOTE — Progress Notes (Signed)
EEG complete - results pending 

## 2022-04-18 NOTE — Procedures (Signed)
Patient Name: Wayne Middleton  MRN: [id number]  Epilepsy Attending: Lora Havens  Referring Physician/Provider: Orson Eva, MD  Date: 04/18/2022 Duration: 21.14 mins  Patient history: 82yo M with ams. EEG to evaluate for seizure  Level of alertness:  sleep  AEDs during EEG study: None  Technical aspects: This EEG study was done with scalp electrodes positioned according to the 10-20 International system of electrode placement. Electrical activity was reviewed with band pass filter of 1-70Hz , sensitivity of 7 uV/mm, display speed of 48mm/sec with a 60Hz  notched filter applied as appropriate. EEG data were recorded continuously and digitally stored.  Video monitoring was available and reviewed as appropriate.  Description: Sleep was characterized by vertex waves, sleep spindles (12 to 14 Hz), maximal frontocentral region. Hyperventilation and photic stimulation were not performed.      IMPRESSION: This study during sleep only is within normal limits. No seizures or epileptiform discharges were seen throughout the recording.  [name] [name]

## 2022-04-18 NOTE — Evaluation (Signed)
Physical Therapy Evaluation Patient Details Name: MAURILIO PURYEAR MRN: [id number] DOB: Feb 18, 1941 Today's Date: 04/18/2022  History of Present Illness  KEONTA ALSIP is a 82 y.o. male with medical history significant for atrial fibrillation on chronic anticoagulation, diastolic CHF, hypertension.  Patient was recently admitted 1/30 to 2/2 for pneumonia, altered mental status, atrial fibrillation with RVR.  He was given 1 dose of Cardizem drip in ED, and continued at home Cardizem with improvement in his heart rate.  He was treated with IV ceftriaxone and azithromycin in the hospital transition to p.o. Levaquin on discharge.    At the time of my evaluation, patient is quite somnolent, unable to answer questions.  No family is at bedside.  ED provider had talked to patient's son earlier.  Patient's son came to check on patient today and found patient on the floor.  He reports after discharge, patient had improvement in his mental status.  But today patient is more confused than he was during recent hospitalization.  They also report that patient's medications were gone, so its is unknown if patient took them, or what he took, or what ever else he might have done with them.   Clinical Impression  Patient demonstrates good return for sitting up at bedside, slightly labored movement during sit to stands and able to ambulate in room/hallway without loss of balance without need for an AD, but occasionally has to lean on nearby objects for support.  Patient put back to bed after therapy to receive a procedure by neurology staff.  Patient will benefit from continued skilled physical therapy in hospital and recommended venue below to increase strength, balance, endurance for safe ADLs and gait.        Recommendations for follow up therapy are one component of a multi-disciplinary discharge planning process, led by the attending physician.  Recommendations may be updated based on patient status, additional functional  criteria and insurance authorization.  Follow Up Recommendations Home health PT Can patient physically be transported by private vehicle: Yes    Assistance Recommended at Discharge Set up Supervision/Assistance  Patient can return home with the following  A little help with walking and/or transfers;A little help with bathing/dressing/bathroom;Help with stairs or ramp for entrance;Assistance with cooking/housework    Equipment Recommendations None recommended by PT  Recommendations for Other Services       Functional Status Assessment Patient has had a recent decline in their functional status and demonstrates the ability to make significant improvements in function in a reasonable and predictable amount of time.     Precautions / Restrictions Precautions Precautions: Fall Restrictions Weight Bearing Restrictions: No      Mobility  Bed Mobility Overal bed mobility: Modified Independent                  Transfers Overall transfer level: Needs assistance Equipment used: 1 person hand held assist, None Transfers: Sit to/from Stand, Bed to chair/wheelchair/BSC Sit to Stand: Min guard   Step pivot transfers: Min guard       General transfer comment: slightly labored movement without loss of balance    Ambulation/Gait Ambulation/Gait assistance: Supervision, Min guard Gait Distance (Feet): 80 Feet Assistive device: None, 1 person hand held assist Gait Pattern/deviations: Decreased step length - left, Decreased stance time - right, Decreased stride length Gait velocity: slightly decreased     General Gait Details: slightly labored cadence with occasional leaning on nearby objects without loss balance  Stairs  Wheelchair Mobility    Modified Rankin (Stroke Patients Only)       Balance Overall balance assessment: Mild deficits observed, not formally tested                                           Pertinent Vitals/Pain  Pain Assessment Pain Assessment: No/denies pain    Home Living Family/patient expects to be discharged to:: Private residence Living Arrangements: Alone Available Help at Discharge: Family;Available PRN/intermittently Type of Home: House Home Access: Stairs to enter Entrance Stairs-Rails: None Entrance Stairs-Number of Steps: 1   Home Layout: One level Home Equipment: Conservation officer, nature (2 wheels);Cane - single point;Shower seat;BSC/3in1;Wheelchair - manual;Grab bars - toilet;Other (comment)      Prior Function Prior Level of Function : Independent/Modified Independent;History of Falls (last six months)             Mobility Comments: Community ambulator without AD. ADLs Comments: Independent ADL and IADL; pt drives.     Hand Dominance   Dominant Hand: Right    Extremity/Trunk Assessment   Upper Extremity Assessment Upper Extremity Assessment: Overall WFL for tasks assessed    Lower Extremity Assessment Lower Extremity Assessment: Generalized weakness    Cervical / Trunk Assessment Cervical / Trunk Assessment: Normal  Communication   Communication: No difficulties  Cognition Arousal/Alertness: Awake/alert Behavior During Therapy: WFL for tasks assessed/performed, Impulsive Overall Cognitive Status: Within Functional Limits for tasks assessed                                          General Comments      Exercises     Assessment/Plan    PT Assessment Patient needs continued PT services  PT Problem List Decreased strength;Decreased activity tolerance;Decreased balance;Decreased mobility       PT Treatment Interventions DME instruction;Gait training;Stair training;Functional mobility training;Therapeutic activities;Therapeutic exercise;Balance training;Patient/family education    PT Goals (Current goals can be found in the Care Plan section)  Acute Rehab PT Goals Patient Stated Goal: return home with family to assist PT Goal Formulation:  With patient Time For Goal Achievement: 04/22/22 Potential to Achieve Goals: Good    Frequency Min 3X/week     Co-evaluation               AM-PAC PT "6 Clicks" Mobility  Outcome Measure Help needed turning from your back to your side while in a flat bed without using bedrails?: None Help needed moving from lying on your back to sitting on the side of a flat bed without using bedrails?: None Help needed moving to and from a bed to a chair (including a wheelchair)?: A Little Help needed standing up from a chair using your arms (e.g., wheelchair or bedside chair)?: A Little Help needed to walk in hospital room?: A Little Help needed climbing 3-5 steps with a railing? : A Little 6 Click Score: 20    End of Session   Activity Tolerance: Patient tolerated treatment well Patient left: in bed;with call bell/phone within reach;with bed alarm set Nurse Communication: Mobility status PT Visit Diagnosis: Unsteadiness on feet (R26.81);Other abnormalities of gait and mobility (R26.89);Other symptoms and signs involving the nervous system (R29.898)    Time: 1610-9604 PT Time Calculation (min) (ACUTE ONLY): 21 min   Charges:   PT Evaluation $  PT Eval Moderate Complexity: 1 Mod PT Treatments $Therapeutic Activity: 8-22 mins        3:58 PM, 04/18/22 Lonell Grandchild, MPT Physical Therapist with [facility] [phone] office [phone] mobile phone

## 2022-04-19 DIAGNOSIS — I5032 Chronic diastolic (congestive) heart failure: Secondary | ICD-10-CM | POA: Diagnosis not present

## 2022-04-19 DIAGNOSIS — I4821 Permanent atrial fibrillation: Secondary | ICD-10-CM | POA: Diagnosis not present

## 2022-04-19 DIAGNOSIS — I482 Chronic atrial fibrillation, unspecified: Secondary | ICD-10-CM | POA: Diagnosis not present

## 2022-04-19 DIAGNOSIS — G9341 Metabolic encephalopathy: Secondary | ICD-10-CM | POA: Diagnosis not present

## 2022-04-19 LAB — CBC
HCT: 34.9 % — ABNORMAL LOW (ref 39.0–52.0)
Hemoglobin: 10.7 g/dL — ABNORMAL LOW (ref 13.0–17.0)
MCH: 23 pg — ABNORMAL LOW (ref 26.0–34.0)
MCHC: 30.7 g/dL (ref 30.0–36.0)
MCV: 74.9 fL — ABNORMAL LOW (ref 80.0–100.0)
Platelets: 183 10*3/uL (ref 150–400)
RBC: 4.66 MIL/uL (ref 4.22–5.81)
RDW: 17.8 % — ABNORMAL HIGH (ref 11.5–15.5)
WBC: 5.9 10*3/uL (ref 4.0–10.5)
nRBC: 0 % (ref 0.0–0.2)

## 2022-04-19 LAB — BASIC METABOLIC PANEL
Anion gap: 6 (ref 5–15)
BUN: 15 mg/dL (ref 8–23)
CO2: 26 mmol/L (ref 22–32)
Calcium: 8.2 mg/dL — ABNORMAL LOW (ref 8.9–10.3)
Chloride: 106 mmol/L (ref 98–111)
Creatinine, Ser: 0.92 mg/dL (ref 0.61–1.24)
GFR, Estimated: >60 mL/min (ref >60)
Glucose, Bld: 107 mg/dL — ABNORMAL HIGH (ref 70–99)
Potassium: 3.4 mmol/L — ABNORMAL LOW (ref 3.5–5.1)
Sodium: 138 mmol/L (ref 135–145)

## 2022-04-19 LAB — PROTIME-INR
INR: 3.2 — ABNORMAL HIGH (ref 0.8–1.2)
Prothrombin Time: 32.5 seconds — ABNORMAL HIGH (ref 11.4–15.2)

## 2022-04-19 LAB — MAGNESIUM: Magnesium: 2 mg/dL (ref 1.7–2.4)

## 2022-04-19 LAB — GLUCOSE, CAPILLARY
Glucose-Capillary: 120 mg/dL — ABNORMAL HIGH (ref 70–99)
Glucose-Capillary: 121 mg/dL — ABNORMAL HIGH (ref 70–99)

## 2022-04-19 MED ORDER — WARFARIN SODIUM 5 MG PO TABS: 5.0000 mg | ORAL_TABLET | Freq: Every day | ORAL | Status: DC

## 2022-04-19 MED ORDER — ATENOLOL 25 MG PO TABS: 25.0000 mg | ORAL_TABLET | Freq: Every day | ORAL | Status: DC

## 2022-04-19 MED ORDER — PREGABALIN 300 MG PO CAPS: 300.0000 mg | ORAL_CAPSULE | Freq: Two times a day (BID) | ORAL | 0 refills | Status: DC

## 2022-04-19 MED ORDER — LOPERAMIDE HCL 2 MG PO CAPS
2.0000 mg | ORAL_CAPSULE | ORAL | Status: DC | PRN
  Administered 2022-04-19: 2 mg via ORAL
  Filled 2022-04-19: qty 1

## 2022-04-19 MED ORDER — PREGABALIN 300 MG PO CAPS: 300.0000 mg | ORAL_CAPSULE | Freq: Four times a day (QID) | ORAL | 0 refills | Status: DC

## 2022-04-19 MED ORDER — POTASSIUM CHLORIDE CRYS ER 20 MEQ PO TBCR
20.0000 meq | EXTENDED_RELEASE_TABLET | Freq: Once | ORAL | Status: AC
  Administered 2022-04-19: 20 meq via ORAL
  Filled 2022-04-19: qty 1

## 2022-04-19 MED ORDER — OXYCODONE HCL 20 MG PO TABS: 20.0000 mg | ORAL_TABLET | Freq: Three times a day (TID) | ORAL | 0 refills | Status: DC

## 2022-04-19 NOTE — Progress Notes (Signed)
ANTICOAGULATION CONSULT NOTE -   Pharmacy Consult for warfarin Indication: atrial fibrillation  No Known Allergies  Patient Measurements: Height: 6' (182.9 cm) Weight: 98.8 kg (217 lb 13 oz) IBW/kg (Calculated) : 77.6   Vital Signs:    Labs: Recent Labs    04/17/22 1637 04/17/22 2113 04/18/22 0420 04/19/22 0414  HGB 11.0*  --  10.9* 10.7*  HCT 37.1*  --  36.1* 34.9*  PLT 200  --  193 183  LABPROT 37.8*  --  42.6* 32.5*  INR 3.9*  --  4.5* 3.2*  CREATININE 1.33*  --  0.95 0.92  CKTOTAL  --  139  --   --      Estimated Creatinine Clearance: 75.4 mL/min (by C-G formula based on SCr of 0.92 mg/dL).   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 Patient is on warfarin prior to admission for afib. ICBC stable overnight, no bleeding issues noted. Will continue to hold warfarin until INR trends down. Home dose listed as 5 mg daily.  INR 3.9 >4.5 > 3.2  Goal of Therapy:  INR 2-3 Monitor platelets by anticoagulation protocol: Yes   Plan:  Hold warfarin  Daily INR  Margot Ables, PharmD Clinical Pharmacist 04/19/2022 8:56 AM

## 2022-04-19 NOTE — Progress Notes (Signed)
Physical Therapy Treatment Patient Details Name: Wayne Middleton MRN: [id number] DOB: 25-Nov-1940 Today's Date: 04/19/2022   History of Present Illness Wayne Middleton is a 82 y.o. male with medical history significant for atrial fibrillation on chronic anticoagulation, diastolic CHF, hypertension.  Patient was recently admitted 1/30 to 2/2 for pneumonia, altered mental status, atrial fibrillation with RVR.  He was given 1 dose of Cardizem drip in ED, and continued at home Cardizem with improvement in his heart rate.  He was treated with IV ceftriaxone and azithromycin in the hospital transition to p.o. Levaquin on discharge.    At the time of my evaluation, patient is quite somnolent, unable to answer questions.  No family is at bedside.  ED provider had talked to patient's son earlier.  Patient's son came to check on patient today and found patient on the floor.  He reports after discharge, patient had improvement in his mental status.  But today patient is more confused than he was during recent hospitalization.  They also report that patient's medications were gone, so its is unknown if patient took them, or what he took, or what ever else he might have done with them.    PT Comments    Patient standing at sink upon entrance for session. Patient completes exercises seated EOB with good sitting balance and sitting tolerance. He transfers to standing and ambulates without AD but is mildly unsteady throughout. Gait improves with use of IV pole for support and he returns to room at end of session. Patient will benefit from continued skilled physical therapy in hospital and recommended venue below to increase strength, balance, endurance for safe ADLs and gait.   Recommendations for follow up therapy are one component of a multi-disciplinary discharge planning process, led by the attending physician.  Recommendations may be updated based on patient status, additional functional criteria and insurance  authorization.  Follow Up Recommendations  Home health PT Can patient physically be transported by private vehicle: Yes   Assistance Recommended at Discharge Set up Supervision/Assistance  Patient can return home with the following A little help with walking and/or transfers;A little help with bathing/dressing/bathroom;Help with stairs or ramp for entrance;Assistance with cooking/housework   Equipment Recommendations  None recommended by PT    Recommendations for Other Services       Precautions / Restrictions Precautions Precautions: Fall Restrictions Weight Bearing Restrictions: No     Mobility  Bed Mobility               General bed mobility comments: Patient standing at sink at beginning of session    Transfers Overall transfer level: Needs assistance Equipment used: None Transfers: Sit to/from Stand, Bed to chair/wheelchair/BSC Sit to Stand: Min guard   Step pivot transfers: Min guard       General transfer comment: slightly labored movement without loss of balance    Ambulation/Gait Ambulation/Gait assistance: Supervision, Min guard Gait Distance (Feet): 200 Feet Assistive device: None, IV Pole Gait Pattern/deviations: Decreased step length - left, Decreased stance time - right, Decreased stride length Gait velocity: slightly decreased     General Gait Details: slightly labored cadence with knees in slight flexed position, mild unsteadiness overall but improved gait with unilateral UE support on IV pole   Stairs             Wheelchair Mobility    Modified Rankin (Stroke Patients Only)       Balance Overall balance assessment: Mild deficits observed, not formally tested  Cognition Arousal/Alertness: Awake/alert Behavior During Therapy: WFL for tasks assessed/performed, Impulsive Overall Cognitive Status: Within Functional Limits for tasks assessed                                  General Comments: somewhat impulsive and seemed to lack insight into current functional status.        Exercises General Exercises - Lower Extremity Ankle Circles/Pumps: AROM, Both, 20 reps, Seated Long Arc Quad: AROM, Both, 20 reps, Seated Hip Flexion/Marching: AROM, Both, 20 reps, Seated    General Comments        Pertinent Vitals/Pain Pain Assessment Pain Assessment: No/denies pain    Home Living                          Prior Function            PT Goals (current goals can now be found in the care plan section) Acute Rehab PT Goals Patient Stated Goal: return home with family to assist PT Goal Formulation: With patient Time For Goal Achievement: 04/22/22 Potential to Achieve Goals: Good Progress towards PT goals: Progressing toward goals    Frequency    Min 3X/week      PT Plan Current plan remains appropriate    Co-evaluation              AM-PAC PT "6 Clicks" Mobility   Outcome Measure  Help needed turning from your back to your side while in a flat bed without using bedrails?: None Help needed moving from lying on your back to sitting on the side of a flat bed without using bedrails?: None Help needed moving to and from a bed to a chair (including a wheelchair)?: A Little Help needed standing up from a chair using your arms (e.g., wheelchair or bedside chair)?: A Little Help needed to walk in hospital room?: A Little Help needed climbing 3-5 steps with a railing? : A Little 6 Click Score: 20    End of Session   Activity Tolerance: Patient tolerated treatment well Patient left: in bed;with call bell/phone within reach;with bed alarm set Nurse Communication: Mobility status PT Visit Diagnosis: Unsteadiness on feet (R26.81);Other abnormalities of gait and mobility (R26.89);Other symptoms and signs involving the nervous system (R29.898)     Time: [id number] PT Time Calculation (min) (ACUTE ONLY): 10 min  Charges:   $Therapeutic Activity: 8-22 mins                     12:37 PM, 04/19/22 Mearl Latin PT, DPT Physical Therapist at [facility]

## 2022-04-19 NOTE — TOC Transition Note (Signed)
Transition of Care [facility]) - CM/SW Discharge Note   Patient Details  Name: Wayne Middleton MRN: [id number] Date of Birth: [birth year]  Transition of [facility]) CM/SW Contact:  Iona Beard, [place] Phone Number: 04/19/2022, 10:33 AM  Clinical Narrative:    CSW spoke with pts son to review bed offer, [facility] has been selected. CSW updated pts insurance auth of PT notes and selected facility, pts insurance Josem Kaufmann has been approved. CSW updated Rachel Moulds in admissions who states they can admit pt today. CSW updated MD, who will complete D/C. CSW to provide RN with numbers for room and report. CSW also updated pts son at facility request that pt will have a $20 copay per day, he is understanding and agreeable. TOC signing off.   Final next level of care: Skilled Nursing Facility Barriers to Discharge: Barriers Resolved   Patient Goals and CMS Choice CMS Medicare.gov Compare Post Acute Care list provided to:: Patient Represenative (must comment) Choice offered to / list presented to : Adult Children, Patient  Discharge Placement                Patient [facility] bed at: [facility] Patient to be transferred to facility by: family Name of family member notified: Kylee Utecht Patient and family notified of of transfer: 04/19/22  Discharge Plan and Services Additional resources added to the After Visit Summary for                                       Social Determinants of Health (SDOH) Interventions SDOH Screenings   Food Insecurity: No Food Insecurity (04/10/2022)  Housing: Low Risk  (04/10/2022)  Transportation Needs: No Transportation Needs (04/10/2022)  Utilities: Not At Risk (04/10/2022)  Tobacco Use: Low Risk  (04/17/2022)     Readmission Risk Interventions     No data to display

## 2022-04-19 NOTE — Discharge Summary (Signed)
Physician Discharge Summary   Patient: Wayne Middleton MRN: [id number] DOB: Jun 14, 1940  Admit date:     04/17/2022  Discharge date: 04/19/22  Discharge Physician: Shanon Brow Yicel Shannon   PCP: Lemmie Evens, MD   Recommendations at discharge:   Please follow up with primary care provider within 1-2 weeks  Please repeat BMP and CBC in one week Recheck INR on 04/22/22 and adjust warfarin dose for INR 2-3     Hospital Course: 82 year old male with a history of diabetes mellitus type 2, hypertension, permanent atrial fibrillation, tachycardia mediated cardiomyopathy, anxiety, peripheral neuropathy presenting with altered mental status.  The patient lives by himself, and his sons usually check in on him.  Apparently, one of the patient's sons found him lying in the hallway minimally responsive on 04/17/2022.  His last known well was 2 PM on 04/16/2022.  CBG was 172. Notably, the patient was initially admitted to the hospital from 03/13/2022 to 04/12/2022 when he was treated for pneumonia.  He was treated with ceftriaxone and azithromycin during hospitalization and discharged home with levofloxacin.  He had atrial fibrillation and was discharged home with Cardizem CD 240 mg daily.  His acute kidney injury improved with IV fluids.  PT recommended skilled nursing facility, but patient/family refused and the patient was discharged home with home health. Apparently, there is some concern with the patient taking extra oxycodone. UDS in the emergency department showed benzodiazepines, but review of PDMP did not show any benzodiazepines.  Patient's son stated that the patient has a pillbox, but all his medications including his controlled substances were gone for the day.  Son notes that the patient has had decreased oral intake for the last few days.  There is been no reports of chest pain, shortness breath, vomiting, diarrhea. In the ED, the patient had low-grade temperature of 99.7 F.  He was hemodynamically stable with oxygen  saturation 94% on room air.  WBC 7.2, hemoglobin 11.0, platelets 193,000.  Sodium 136, potassium 3.5, bicarbonate 27, serum creatinine 1.33.  LFTs were unremarkable.  CPK 139.  Ammonia 14.  COVID PCR negative.  INR 3.9.  EKG showed atrial fibrillation with nonspecific T wave change.  His opioids and lyrica were initially held.  His opioids were gradually reintroduced.  His mental status gradually improved and pt returned to his usual baseline at the time of d/c.  Assessment and Plan: Acute metabolic encephalopathy -Multifactorial including polypharmacy, AKI -Judicious opioids -Patient had benzodiazepines in his UDS, but there is no benzodiazepines on review of his PDMP -UA negative for pyuria -EEG--neg for epileptiform discharges -[id number] -Folic [id number] -[id number] -CT brain neg -2/9--mental status back to baseline -PT eval>>SNF--family agreed   Permanent atrial fibrillation -Rate controlled -Pharmacy assisting with warfarin dosing -continue cardizem CD -restart atenolol at lower dose as BP is controlled to avoid hypotension   Diabetes mellitus type 2, controlled -04/09/2022 hemoglobin A1c 6.7 -NovoLog sliding scale   Acute kidney injury -Baseline creatinine 0.8-1.0 -Presenting serum creatinine 1.33 -Improving with IV fluids>>continue IVF -serum creatinine 0.92 on day of dc  Supratherapeutic INR -likely due to poor po intake -holding coumadin during hospitalization -PharmD assisting -recommending hold warfarin 2/9 -restart home dose 2/10 and recheck INR 2/12   Opioid dependence -PDMP reviewed--oxycodone 20 mg, #90, last refill 03/19/2022 -Lyrica 300 mg, #120 last refill 03/22/2022 -Ambien 10 mg, #30, last refill 03/19/2022   Depression/anxiety -Restart fluoxetine   Essential hypertension -Restart Cardizem CD -Restart lower dose atenolol as BP to avoid hypotension   Chronic diastolic CHF -  clinically euvolemic -03/20/21 Echo EF 50-55%, no WMA, mild-mod AI        Consultants: none Procedures performed: none  Disposition: Skilled nursing facility Diet recommendation:  Carb modified diet DISCHARGE MEDICATION: Allergies as of 04/19/2022   No Known Allergies      Medication List     TAKE these medications    allopurinol 300 MG tablet Commonly known as: ZYLOPRIM Take 300 mg by mouth daily.   atenolol 25 MG tablet Commonly known as: TENORMIN Take 1 tablet (25 mg total) by mouth daily. What changed:  medication strength how much to take   cetirizine 10 MG tablet Commonly known as: ZYRTEC Take 10 mg by mouth daily.   diltiazem 240 MG 24 hr capsule Commonly known as: CARDIZEM CD Take 1 capsule (240 mg total) by mouth daily.   diphenoxylate-atropine 2.5-0.025 MG tablet Commonly known as: LOMOTIL Take 1-2 tablets by mouth 4 (four) times daily as needed for diarrhea or loose stools.   FLUoxetine 20 MG capsule Commonly known as: PROZAC Take 20 mg by mouth daily.   fluticasone 50 MCG/ACT nasal spray Commonly known as: FLONASE Place 1 spray into both nostrils daily.   furosemide 20 MG tablet Commonly known as: Lasix Take 1 tablet (20 mg total) by mouth daily.   nystatin-triamcinolone cream Commonly known as: MYCOLOG II Apply topically 2 (two) times daily.   Oxycodone HCl 20 MG Tabs Take 1 tablet (20 mg total) by mouth every 8 (eight) hours. What changed:  medication strength how much to take when to take this reasons to take this   potassium chloride 10 MEQ CR capsule Commonly known as: MICRO-K Take 1 capsule (10 mEq total) by mouth daily.   pregabalin 300 MG capsule Commonly known as: LYRICA Take 1 capsule (300 mg total) by mouth 2 (two) times daily. What changed: when to take this   warfarin 5 MG tablet Commonly known as: COUMADIN Take 1 tablet (5 mg total) by mouth daily. Start taking on: April 20, 2022        Contact information for after-discharge care     Destination     [facility]  Preferred SNF .   Service: Skilled Nursing Contact information: [address] [phone]                    Discharge Exam: Danley Danker Weights   04/17/22 2010 04/17/22 2300  Weight: 100.7 kg 98.8 kg   HEENT:  [place]/AT, No thrush, no icterus CV:  IRRR, no rub, no S3, no S4 Lung:  CTA, no wheeze, no rhonchi Abd:  soft/+BS, NT Ext:  No edema, no lymphangitis, no synovitis, no rash   Condition at discharge: stable  The results of significant diagnostics from this hospitalization (including imaging, microbiology, ancillary and laboratory) are listed below for reference.   Imaging Studies: EEG adult  Result Date: May 08, 2022 Lora Havens, MD     May 08, 2022  5:20 PM Patient Name: DEVONTAYE EDGHILL MRN: [id number] Epilepsy Attending: Lora Havens Referring Physician/Provider: Orson Eva, MD Date: 05-08-22 Duration: 21.14 mins Patient history: 82yo M with ams. EEG to evaluate for seizure Level of alertness:  sleep AEDs during EEG study: None Technical aspects: This EEG study was done with scalp electrodes positioned according to the 10-20 International system of electrode placement. Electrical activity was reviewed with band pass filter of 1-70Hz$ , sensitivity of 7 uV/mm, display speed of 65m/sec with a 60Hz$  notched filter applied as appropriate. EEG  data were recorded continuously and digitally stored.  Video monitoring was available and reviewed as appropriate. Description: Sleep was characterized by vertex waves, sleep spindles (12 to 14 Hz), maximal frontocentral region. Hyperventilation and photic stimulation were not performed.   IMPRESSION: This study during sleep only is within normal limits. No seizures or epileptiform discharges were seen throughout the recording. Lora Havens   DG Chest 2 View  Result Date: 04/17/2022 CLINICAL DATA:  Recent pneumonia, now with altered mental status. EXAM: CHEST - 2 VIEW COMPARISON:  Chest radiograph dated  10/13/2021 and CT chest dated 04/09/2022. FINDINGS: The heart is enlarged. Vascular calcifications are seen in the aortic arch. There is mild bibasilar atelectasis. No pleural effusion or pneumothorax. Degenerative changes are seen in the spine. IMPRESSION: 1. Mild bibasilar atelectasis. 2. Cardiomegaly. Electronically Signed   By: Zerita Boers M.D.   On: 04/17/2022 19:47   CT Head Wo Contrast  Result Date: 04/17/2022 CLINICAL DATA:  Altered mental status, found down. EXAM: CT HEAD WITHOUT CONTRAST TECHNIQUE: Contiguous axial images were obtained from the base of the skull through the vertex without intravenous contrast. RADIATION DOSE REDUCTION: This exam was performed according to the departmental dose-optimization program which includes automated exposure control, adjustment of the mA and/or kV according to patient size and/or use of iterative reconstruction technique. COMPARISON:  CT head dated 04/09/2022. FINDINGS: Brain: No evidence of acute infarction, hemorrhage, hydrocephalus, extra-axial collection or mass lesion/mass effect. There is mild cerebral volume loss with associated ex vacuo dilatation. Periventricular white matter hypoattenuation likely represents chronic small vessel ischemic disease. Vascular: There are vascular calcifications in the carotid siphons. Skull: Normal. Negative for fracture or focal lesion. Sinuses/Orbits: No acute finding. Other: None. IMPRESSION: No acute intracranial process. Electronically Signed   By: Zerita Boers M.D.   On: 04/17/2022 19:45   MR BRAIN WO CONTRAST  Result Date: 04/09/2022 CLINICAL DATA:  Altered mental status EXAM: MRI HEAD WITHOUT CONTRAST TECHNIQUE: Multiplanar, multiecho pulse sequences of the brain and surrounding structures were obtained without intravenous contrast. COMPARISON:  None Available. FINDINGS: Brain: No acute infarct, mass effect or extra-axial collection. No chronic microhemorrhage or siderosis. There is confluent hyperintense  T2-weighted signal within the white matter. Generalized volume loss. The midline structures are normal. Vascular: Abnormal right vertebral artery flow void. The other flow voids are normal. Skull and upper cervical spine: Normal marrow signal. Sinuses/Orbits: Negative. Other: None. IMPRESSION: 1. No acute intracranial abnormality. 2. Findings of chronic ischemic microangiopathy and volume loss. 3. 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   On: 04/09/2022 19:17   CT Chest Wo Contrast  Result Date: 04/09/2022 CLINICAL DATA:  Status post fall 3 days ago.  Chest pain. EXAM: CT CHEST WITHOUT CONTRAST TECHNIQUE: Multidetector CT imaging of the chest was performed following the standard protocol without IV contrast. RADIATION DOSE REDUCTION: This exam was performed according to the departmental dose-optimization program which includes automated exposure control, adjustment of the mA and/or kV according to patient size and/or use of iterative reconstruction technique. COMPARISON:  08/24/2009 FINDINGS: Cardiovascular: Multi chamber cardiac enlargement identified. No pericardial effusion. Aortic atherosclerosis and 3 vessel coronary artery calcifications. Mediastinum/Nodes: No enlarged mediastinal or axillary lymph nodes. Thyroid gland, trachea, and esophagus demonstrate no significant findings. Lungs/Pleura: No pneumothorax, pleural fluid or signs of pulmonary contusion. There is partial atelectasis of the anterior right middle lobe with right middle lobe volume loss. Diffuse bronchial wall thickening. Mild centrilobular and paraseptal emphysema. Bilateral  lower lung zone predominant peripheral interstitial reticulation, ground-glass attenuation identified. Extensive tree-in-bud nodularity is identified throughout the right middle lobe and right lower lobe. Perifissural nodule along the major fissure measures 1.2 x 0.5 cm, image 110/5. Upper Abdomen: No acute  abnormality. Musculoskeletal: No chest wall mass or suspicious bone lesions identified. IMPRESSION: 1. No acute cardiopulmonary abnormalities. 2. Extensive tree-in-bud nodularity is identified throughout the right middle lobe and right lower lobe. Findings are favored to represent sequelae of chronic aspiration or infection. 3. Diffuse bronchial wall thickening with emphysema, as above; imaging findings suggestive of underlying COPD. 4. Bilateral lower lung zone predominant peripheral interstitial reticulation, ground-glass attenuation identified. Findings are nonspecific but may reflect underlying interstitial lung disease. 5. Perifissural nodule along the major fissure measures 1.2 x 0.5 cm. This most likely represents a benign intrapulmonary lymph node. 6. Age-indeterminate right middle lobe atelectasis with volume loss. 7. Aortic Atherosclerosis (ICD10-I70.0) and Emphysema (ICD10-J43.9). Electronically Signed   By: Kerby Moors M.D.   On: 04/09/2022 15:23   CT Head Wo Contrast  Result Date: 04/09/2022 CLINICAL DATA:  Neck Trauma EXAM: CT HEAD WITHOUT CONTRAST CT CERVICAL SPINE WITHOUT CONTRAST CT THORACIC SPINE WITHOUT CONTRAST TECHNIQUE: Multidetector CT imaging of the head and cervical and thoracic spine was performed following the standard protocol without intravenous contrast. Multiplanar CT image reconstructions of the cervical spine were also generated. RADIATION DOSE REDUCTION: This exam was performed according to the departmental dose-optimization program which includes automated exposure control, adjustment of the mA and/or kV according to patient size and/or use of iterative reconstruction technique. COMPARISON:  None Available. FINDINGS: CT HEAD FINDINGS Brain: No evidence of acute infarction, hemorrhage, hydrocephalus, extra-axial collection or mass lesion/mass effect. There is sequela of moderate chronic microvascular ischemic change. There is chronic small-vessel infarct in the right  lentiform nucleus. Vascular: No hyperdense vessel or unexpected calcification. Skull: Normal. Negative for fracture or focal lesion. Sinuses/Orbits: No mastoid or middle ear effusion. Paranasal sinuses are clear. Orbits are unremarkable. Other: None CT CERVICAL AND THORACIC SPINE FINDINGS Alignment: There is straightening of the normal cervical lordosis. Skull base and vertebrae: No acute fracture. No primary bone lesion or focal pathologic process. Soft tissues and spinal canal: No prevertebral fluid or swelling. No visible canal hematoma. Disc levels:  No evidence of high-grade spinal canal stenosis. Upper chest: Clustered nodularity in the right lobe right lower lobe, favored to be infectious or inflammatory, see same-day chest for additional findings. Other: None IMPRESSION: 1. No acute intracranial abnormality. 2. No acute fracture or traumatic malalignment of the cervical or thoracic spine. 3. Clustered nodularity in the right lower lobe, favored to be infectious or inflammatory, see same-day chest for additional findings. Electronically Signed   By: Marin Roberts M.D.   On: 04/09/2022 15:18   CT T-SPINE NO CHARGE  Result Date: 04/09/2022 CLINICAL DATA:  Neck Trauma EXAM: CT HEAD WITHOUT CONTRAST CT CERVICAL SPINE WITHOUT CONTRAST CT THORACIC SPINE WITHOUT CONTRAST TECHNIQUE: Multidetector CT imaging of the head and cervical and thoracic spine was performed following the standard protocol without intravenous contrast. Multiplanar CT image reconstructions of the cervical spine were also generated. RADIATION DOSE REDUCTION: This exam was performed according to the departmental dose-optimization program which includes automated exposure control, adjustment of the mA and/or kV according to patient size and/or use of iterative reconstruction technique. COMPARISON:  None Available. FINDINGS: CT HEAD FINDINGS Brain: No evidence of acute infarction, hemorrhage, hydrocephalus, extra-axial collection or mass  lesion/mass effect. There is sequela of moderate chronic microvascular  ischemic change. 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 Vascular: No hyperdense vessel or unexpected calcification. Skull: Normal. Negative for fracture or focal lesion. Sinuses/Orbits: No mastoid or middle ear effusion. Paranasal sinuses are clear. Orbits are unremarkable. Other: None CT CERVICAL AND THORACIC SPINE FINDINGS Alignment: There is straightening of the normal cervical lordosis. Skull base and vertebrae: No acute fracture. No primary bone lesion or focal pathologic process. Soft tissues and spinal canal: No prevertebral fluid or swelling. No visible canal hematoma. Disc levels:  No evidence of high-grade spinal canal stenosis. Upper chest: Clustered nodularity in the right lobe right lower lobe, favored to be infectious or inflammatory, see same-day chest for additional findings. Other: None IMPRESSION: 1. No acute intracranial abnormality. 2. No acute fracture or traumatic malalignment of the cervical or thoracic spine. 3. Clustered nodularity in the right lower lobe, favored to be infectious or inflammatory, see same-day chest for additional findings. Electronically Signed   By: Marin Roberts M.D.   On: 04/09/2022 15:18   CT Cervical Spine Wo Contrast  Result Date: 04/09/2022 CLINICAL DATA:  Neck Trauma EXAM: CT HEAD WITHOUT CONTRAST CT CERVICAL SPINE WITHOUT CONTRAST CT THORACIC SPINE WITHOUT CONTRAST TECHNIQUE: Multidetector CT imaging of the head and cervical and thoracic spine was performed following the standard protocol without intravenous contrast. Multiplanar CT image reconstructions of the cervical spine were also generated. RADIATION DOSE REDUCTION: This exam was performed according to the departmental dose-optimization program which includes automated exposure control, adjustment of the mA and/or kV according to patient size and/or use of iterative reconstruction technique. COMPARISON:   None Available. FINDINGS: CT HEAD FINDINGS Brain: No evidence of acute infarction, hemorrhage, hydrocephalus, extra-axial collection or mass lesion/mass effect. There is sequela of moderate chronic microvascular ischemic change. 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 Vascular: No hyperdense vessel or unexpected calcification. Skull: Normal. Negative for fracture or focal lesion. Sinuses/Orbits: No mastoid or middle ear effusion. Paranasal sinuses are clear. Orbits are unremarkable. Other: None CT CERVICAL AND THORACIC SPINE FINDINGS Alignment: There is straightening of the normal cervical lordosis. Skull base and vertebrae: No acute fracture. No primary bone lesion or focal pathologic process. Soft tissues and spinal canal: No prevertebral fluid or swelling. No visible canal hematoma. Disc levels:  No evidence of high-grade spinal canal stenosis. Upper chest: Clustered nodularity in the right lobe right lower lobe, favored to be infectious or inflammatory, see same-day chest for additional findings. Other: None IMPRESSION: 1. No acute intracranial abnormality. 2. No acute fracture or traumatic malalignment of the cervical or thoracic spine. 3. Clustered nodularity in the right lower lobe, favored to be infectious or inflammatory, see same-day chest for additional findings. Electronically Signed   By: Marin Roberts M.D.   On: 04/09/2022 15:18    Microbiology: Results for orders placed or performed during the hospital encounter of 04/17/22  Resp panel by RT-PCR (RSV, Flu A&B, Covid) Anterior Nasal Swab     Status: None   Collection Time: 04/17/22  6:27 PM   Specimen: Anterior Nasal Swab  Result Value Ref Range Status   SARS Coronavirus 2 by RT PCR NEGATIVE NEGATIVE Final    Comment: (NOTE) SARS-CoV-2 target nucleic acids are NOT DETECTED.  The SARS-CoV-2 RNA is generally detectable in upper respiratory specimens during the acute phase of infection. The lowest concentration  of SARS-CoV-2 viral copies this assay can detect is 138 copies/mL. A negative result does not preclude SARS-Cov-2 infection and should not be used as  the sole basis for treatment or other patient management decisions. A negative result may occur with  improper specimen collection/handling, submission of specimen other than nasopharyngeal swab, presence of viral mutation(s) within the areas targeted by this assay, and inadequate number of viral copies(<138 copies/mL). A negative result must be combined with clinical observations, patient history, and epidemiological information. The expected result is Negative.  Fact Sheet for Patients:  EntrepreneurPulse.com.au  Fact Sheet for Healthcare Providers:  IncredibleEmployment.be  This test is no t yet approved or cleared by the [place] FDA and  has been authorized for detection and/or diagnosis of SARS-CoV-2 by FDA under an Emergency Use Authorization (EUA). This EUA will remain  in effect (meaning this test can be used) for the duration of the COVID-19 declaration under Section 564(b)(1) of the Act, 21 U.S.C.section 360bbb-3(b)(1), unless the authorization is terminated  or revoked sooner.       Influenza A by PCR NEGATIVE NEGATIVE Final   Influenza B by PCR NEGATIVE NEGATIVE Final    Comment: (NOTE) The Xpert Xpress SARS-CoV-2/FLU/RSV plus assay is intended as an aid in the diagnosis of influenza from Nasopharyngeal swab specimens and should not be used as a sole basis for treatment. Nasal washings and aspirates are unacceptable for Xpert Xpress SARS-CoV-2/FLU/RSV testing.  Fact Sheet for Patients: EntrepreneurPulse.com.au  Fact Sheet for Healthcare Providers: IncredibleEmployment.be  This test is not yet approved or cleared by the [place] FDA and has been authorized for detection and/or diagnosis of SARS-CoV-2 by FDA under an Emergency Use  Authorization (EUA). This EUA will remain in effect (meaning this test can be used) for the duration of the COVID-19 declaration under Section 564(b)(1) of the Act, 21 U.S.C. section 360bbb-3(b)(1), unless the authorization is terminated or revoked.     Resp Syncytial Virus by PCR NEGATIVE NEGATIVE Final    Comment: (NOTE) Fact Sheet for Patients: EntrepreneurPulse.com.au  Fact Sheet for Healthcare Providers: IncredibleEmployment.be  This test is not yet approved or cleared by the [place] FDA and has been authorized for detection and/or diagnosis of SARS-CoV-2 by FDA under an Emergency Use Authorization (EUA). This EUA will remain in effect (meaning this test can be used) for the duration of the COVID-19 declaration under Section 564(b)(1) of the Act, 21 U.S.C. section 360bbb-3(b)(1), unless the authorization is terminated or revoked.  Performed at [facility], [address], [place], [place] [address]   MRSA Next Gen by PCR, Nasal     Status: None   Collection Time: 04/17/22 11:15 PM   Specimen: Nasal Mucosa; Nasal Swab  Result Value Ref Range Status   MRSA by PCR Next Gen NOT DETECTED NOT DETECTED Final    Comment: (NOTE) The GeneXpert MRSA Assay (FDA approved for NASAL specimens only), is one component of a comprehensive MRSA colonization surveillance program. It is not intended to diagnose MRSA infection nor to guide or monitor treatment for MRSA infections. Test performance is not FDA approved in patients less than 83 years old. Performed at [facility], [address], [place], [place] [address]     Labs: CBC: Recent Labs  Lab 04/17/22 1637 04/18/22 0420 04/19/22 0414  WBC 7.2 5.5 5.9  HGB 11.0* 10.9* 10.7*  HCT 37.1* 36.1* 34.9*  MCV 75.9* 74.1* 74.9*  PLT 200 193 [id number]   Basic Metabolic Panel: Recent Labs  Lab 04/17/22 1637 04/18/22 0420 04/19/22 0414  NA 136 139 138  K 3.5 3.3* 3.4*  CL 100 103 106  CO2 27  27 26  GLUCOSE 122* 129* 107*  BUN 25* 19 15  CREATININE 1.33* 0.95 0.92  CALCIUM 8.4* 8.3* 8.2*  MG  --   --  2.0   Liver Function Tests: Recent Labs  Lab 04/17/22 1637  AST 27  ALT 22  ALKPHOS 136*  BILITOT 0.9  PROT 6.5  ALBUMIN 3.0*   CBG: Recent Labs  Lab 04/18/22 0728 04/18/22 1124 04/18/22 1644 04/18/22 2004 04/19/22 0736  GLUCAP 126* 211* 110* 184* 120*    Discharge time spent: greater than 30 minutes.  Signed: Orson Eva, MD [facility] 04/19/2022

## 2022-04-19 NOTE — Care Management Important Message (Signed)
Important Message  Patient Details  Name: Wayne Middleton MRN: [id number] Date of Birth: [birth year]   Medicare Important Message Given:  Yes     Tommy Medal 04/19/2022, 10:31 AM

## 2022-04-19 NOTE — Progress Notes (Signed)
Report called to [facility] rehab to [place], Therapist, sports.

## 2022-07-19 ENCOUNTER — Other Ambulatory Visit (HOSPITAL_COMMUNITY): Payer: Self-pay | Admitting: Family Medicine

## 2022-07-19 DIAGNOSIS — Z87891 Personal history of nicotine dependence: Secondary | ICD-10-CM

## 2022-07-19 DIAGNOSIS — J9601 Acute respiratory failure with hypoxia: Secondary | ICD-10-CM

## 2022-07-22 ENCOUNTER — Encounter (HOSPITAL_COMMUNITY): Payer: Self-pay | Admitting: Family Medicine

## 2022-07-22 ENCOUNTER — Telehealth: Payer: Self-pay | Admitting: Family Medicine

## 2022-07-22 ENCOUNTER — Other Ambulatory Visit (HOSPITAL_COMMUNITY): Payer: Self-pay | Admitting: Family Medicine

## 2022-07-22 DIAGNOSIS — I4891 Unspecified atrial fibrillation: Secondary | ICD-10-CM

## 2022-07-22 DIAGNOSIS — I499 Cardiac arrhythmia, unspecified: Secondary | ICD-10-CM

## 2022-07-22 NOTE — Telephone Encounter (Signed)
[name] (patel Pt ) came by the office wanting to see if Wayne Middleton would take on patient (dad) as pcp . Wants to keep them with same provider.   Wants a call back in regard  Wayne Middleton [phone]

## 2022-07-22 NOTE — Telephone Encounter (Signed)
Patient will call back to schedule appt. Okay per Dr Allena Katz to take as a new patient

## 2022-07-22 NOTE — Telephone Encounter (Signed)
Pt scheduled son aware

## 2022-08-21 ENCOUNTER — Ambulatory Visit: Payer: Medicare HMO | Admitting: Internal Medicine

## 2022-08-30 ENCOUNTER — Encounter: Payer: Self-pay | Admitting: Internal Medicine

## 2022-08-30 ENCOUNTER — Ambulatory Visit (INDEPENDENT_AMBULATORY_CARE_PROVIDER_SITE_OTHER): Payer: Medicare HMO | Admitting: Internal Medicine

## 2022-08-30 VITALS — BP 137/73 | HR 58

## 2022-08-30 DIAGNOSIS — I10 Essential (primary) hypertension: Secondary | ICD-10-CM

## 2022-08-30 DIAGNOSIS — G609 Hereditary and idiopathic neuropathy, unspecified: Secondary | ICD-10-CM

## 2022-08-30 DIAGNOSIS — I4821 Permanent atrial fibrillation: Secondary | ICD-10-CM

## 2022-08-30 DIAGNOSIS — I5032 Chronic diastolic (congestive) heart failure: Secondary | ICD-10-CM | POA: Diagnosis not present

## 2022-08-30 DIAGNOSIS — F3341 Major depressive disorder, recurrent, in partial remission: Secondary | ICD-10-CM

## 2022-08-30 DIAGNOSIS — M48062 Spinal stenosis, lumbar region with neurogenic claudication: Secondary | ICD-10-CM

## 2022-08-30 DIAGNOSIS — R739 Hyperglycemia, unspecified: Secondary | ICD-10-CM

## 2022-08-30 DIAGNOSIS — J439 Emphysema, unspecified: Secondary | ICD-10-CM

## 2022-08-30 DIAGNOSIS — J019 Acute sinusitis, unspecified: Secondary | ICD-10-CM

## 2022-08-30 DIAGNOSIS — I890 Lymphedema, not elsewhere classified: Secondary | ICD-10-CM

## 2022-08-30 DIAGNOSIS — I700 Atherosclerosis of aorta: Secondary | ICD-10-CM

## 2022-08-30 DIAGNOSIS — Z7901 Long term (current) use of anticoagulants: Secondary | ICD-10-CM

## 2022-08-30 DIAGNOSIS — G894 Chronic pain syndrome: Secondary | ICD-10-CM

## 2022-08-30 DIAGNOSIS — F1190 Opioid use, unspecified, uncomplicated: Secondary | ICD-10-CM

## 2022-08-30 DIAGNOSIS — G4700 Insomnia, unspecified: Secondary | ICD-10-CM

## 2022-08-30 MED ORDER — FUROSEMIDE 40 MG PO TABS
40.0000 mg | ORAL_TABLET | Freq: Every day | ORAL | 3 refills | Status: DC
Start: 2022-08-30 — End: 2023-02-03

## 2022-08-30 MED ORDER — OXYCODONE HCL 20 MG PO TABS
20.0000 mg | ORAL_TABLET | Freq: Three times a day (TID) | ORAL | 0 refills | Status: DC
Start: 2022-08-30 — End: 2022-09-16

## 2022-08-30 MED ORDER — AMOXICILLIN-POT CLAVULANATE 875-125 MG PO TABS
1.0000 | ORAL_TABLET | Freq: Two times a day (BID) | ORAL | 0 refills | Status: DC
Start: 2022-08-30 — End: 2022-09-16

## 2022-08-30 MED ORDER — OXYCODONE HCL 20 MG PO TABS
20.0000 mg | ORAL_TABLET | Freq: Three times a day (TID) | ORAL | 0 refills | Status: DC
Start: 2022-08-30 — End: 2022-08-30

## 2022-08-30 NOTE — Assessment & Plan Note (Signed)
On Coumadin due to A-fib Check INR and CBC

## 2022-08-30 NOTE — Assessment & Plan Note (Signed)
BP Readings from Last 1 Encounters:  08/30/22 137/73   Well-controlled with atenolol and Cardizem Counseled for compliance with the medications Advised DASH diet and ambulate as tolerated

## 2022-08-30 NOTE — Assessment & Plan Note (Signed)
Uncontrolled On Prozac 40 mg daily His hypersomnolence and chronic fatigue are contributing to MDD, need to decrease opioid medication burden

## 2022-08-30 NOTE — Assessment & Plan Note (Signed)
Has chronic lymphedema Has bilateral lower lung fields crackles, concern for pleural effusion Increased dose of Lasix to 40 mg daily Referred to cardiology-he is planned to get repeat echocardiogram

## 2022-08-30 NOTE — Assessment & Plan Note (Signed)
Noted on CT chest Not on statin currently Check lipid profile

## 2022-08-30 NOTE — Assessment & Plan Note (Signed)
Chronic leg swelling, likely lymphedema with excoriations On Lasix 20 mg daily, increased dose to 40 mg daily Check CMP and BNP Advised to perform leg elevation Compression socks Referred to home health for wound care and PT for lymphedema

## 2022-08-30 NOTE — Progress Notes (Addendum)
New Patient Office Visit  Subjective:  Patient ID: Wayne Middleton, male    DOB: [birth year]  Age: 82 y.o. MRN: [id number]  CC:  Chief Complaint  Patient presents with   Establish Care    New patient    Pain    Patient Is in need of refill on his oxycodone   Fatigue    Patient is fatigued everyday and never have any energy     HPI Wayne Middleton is a 82 y.o. male with past medical history of atrial fibrillation, HFpEF, HTN, COPD, peripheral neuropathy, lumbar spinal stenosis s/p lumbar surgeries, chronic pain syndrome, MDD and lymphedema who presents for establishing care.  Atrial fibrillation, HFpEF and HTN: He takes atenolol 25 mg daily and Cardizem to 40 mg daily.  He takes Lasix 20 mg daily for leg swelling and HFpEF.  He has chronic leg swelling.  He takes Coumadin for A-fib, but has not seen cardio or just since 11/22.  Denies any chest pain, dyspnea or palpitations currently.  He has chronic lymphedema and has excoriations on his legs.  He had a leg wound on his right leg, for which he had home health nurse.  His leg wound has improved, but still has necrotic scar over the wound.  Lumbar spinal stenosis and chronic pain syndrome: He takes oxycodone 20 mg 3 times daily and Lyrica 300 mg twice daily currently.  He ran out of his medicines 2 days ago and requests refills.  He has severe, chronic low back pain, which radiates to bilateral LE.  He has limited ambulation due to severe low back pain.  He has chronic numbness of the hands, for which he takes Lyrica.  Of note, his sons report that he is tired throughout the day and feels sleepy.  I had lengthy discussion about potential need to decrease the dose of opioids and Lyrica to decrease his hypersomnolence.  MDD: He takes Prozac 40 mg daily currently.  He lives alone.  He is independent with his ADLs and his sons help him with groceries.  He has insomnia at nighttime and takes Ambien 10 mg BID (?) for it.  He also takes allopurinol for  remote history of questionable gout.  Denies any recent episode of gout flareup.  He agrees to stop taking allopurinol.  Past Medical History:  Diagnosis Date   Acute metabolic encephalopathy 04/17/2022   Acute respiratory failure (HCC) 04/09/2022   Atrial fibrillation (HCC)    CAP (community acquired pneumonia) 04/09/2022   Cardiomyopathy (HCC)    Tachycardia mediated, LVEF improved to 50-55% as of 2012   Cholelithiasis    Chronic anticoagulation    Chronic anxiety    Diabetes (HCC)    Essential hypertension    History of GI bleed 2011   Hospitalized for lower GI bleed following polypectomy   Peripheral neuropathy 11/11/2018    Past Surgical History:  Procedure Laterality Date   BACK SURGERY  1971   post-trauma   COLONOSCOPY W/ POLYPECTOMY  2006, 2011   postprocedure bleed requiring hospital admission   TUMOR EXCISION  1960s   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    Family History  Problem Relation Age of Onset   Stroke Mother 79   Heart attack Father 91   Lung cancer Brother        3 of 5 brothers deceased due to Carcinoma of the lung    Social History   Socioeconomic History   Marital status: Widowed  Spouse name: Not on file   Number of children: Not on file   Years of education: Not on file   Highest education level: Not on file  Occupational History   Occupation: Retired from [employer]  Tobacco Use   Smoking status: Never   Smokeless tobacco: Never  Vaping Use   Vaping Use: Never used  Substance and Sexual Activity   Alcohol use: No    Alcohol/week: 0.0 standard drinks of alcohol   Drug use: No   Sexual activity: Not on file  Other Topics Concern   Not on file  Social History Narrative   Patient previously walked four miles a day   Social Determinants of Health   Financial Resource Strain: Not on file  Food Insecurity: No Food Insecurity (04/10/2022)   Hunger Vital Sign    Worried About Running Out of Food in the Last Year:  Never true    Ran Out of Food in the Last Year: Never true  Transportation Needs: No Transportation Needs (04/10/2022)   PRAPARE - Administrator, Civil Service (Medical): No    Lack of Transportation (Non-Medical): No  Physical Activity: Not on file  Stress: Not on file  Social Connections: Not on file  Intimate Partner Violence: Not At Risk (04/10/2022)   Humiliation, Afraid, Rape, and Kick questionnaire    Fear of Current or Ex-Partner: No    Emotionally Abused: No    Physically Abused: No    Sexually Abused: No    ROS Review of Systems  Constitutional:  Positive for fatigue. Negative for chills and fever.  HENT:  Positive for congestion, postnasal drip, sinus pressure and sore throat.   Eyes:  Negative for pain and discharge.  Respiratory:  Negative for cough and shortness of breath.   Cardiovascular:  Positive for leg swelling. Negative for chest pain and palpitations.  Gastrointestinal:  Negative for diarrhea, nausea and vomiting.  Endocrine: Negative for polydipsia and polyuria.  Genitourinary:  Negative for dysuria and hematuria.  Musculoskeletal:  Positive for arthralgias and back pain. Negative for neck pain and neck stiffness.  Skin:  Positive for color change (Redness over bilateral legs, chronic). Negative for rash.  Neurological:  Positive for weakness. Negative for numbness and headaches.  Psychiatric/Behavioral:  Positive for dysphoric mood and sleep disturbance. Negative for agitation and behavioral problems. The patient is nervous/anxious.     Objective:   Today's Vitals: BP 137/73 (BP Location: Right Arm, Patient Position: Sitting, Cuff Size: Large)   Pulse (!) 58   SpO2 92%   Physical Exam Vitals reviewed.  Constitutional:      General: He is not in acute distress.    Appearance: He is not diaphoretic.     Comments: In wheelchair  HENT:     Head: Normocephalic and atraumatic.     Nose: Nose normal.     Mouth/Throat:     Mouth: Mucous  membranes are moist.  Eyes:     General: No scleral icterus.    Extraocular Movements: Extraocular movements intact.  Cardiovascular:     Rate and Rhythm: Normal rate and regular rhythm.     Heart sounds: Normal heart sounds. No murmur heard. Pulmonary:     Breath sounds: Rales (Bilateral lower lung fields) present. No wheezing.  Musculoskeletal:     Cervical back: Neck supple. No tenderness.     Right lower leg: Edema (2+) present.     Left lower leg: Edema (2+) present.  Skin:  General: Skin is warm.     Findings: Rash (Erythema over bilateral legs up to knee, with excoriations and a necrotic eschar over right leg) present.  Neurological:     General: No focal deficit present.     Mental Status: He is alert and oriented to person, place, and time.  Psychiatric:        Mood and Affect: Mood normal.        Behavior: Behavior normal.     Assessment & Plan:   Problem List Items Addressed This Visit       Cardiovascular and Mediastinum   Essential hypertension, benign    BP Readings from Last 1 Encounters:  08/30/22 137/73  Well-controlled with atenolol and Cardizem Counseled for compliance with the medications Advised DASH diet and ambulate as tolerated       Relevant Medications   furosemide (LASIX) 40 MG tablet   Other Relevant Orders   CBC with Differential/Platelet   CMP14+EGFR   Magnesium   TSH   Chronic diastolic CHF (congestive heart failure) (HCC)    Has chronic lymphedema Has bilateral lower lung fields crackles, concern for pleural effusion Increased dose of Lasix to 40 mg daily Referred to cardiology-he is planned to get repeat echocardiogram      Relevant Medications   furosemide (LASIX) 40 MG tablet   Other Relevant Orders   CMP14+EGFR   B Nat Peptide   Ambulatory referral to Cardiology   Ambulatory referral to Home Health   Permanent atrial fibrillation (HCC) - Primary    Rate controlled with atenolol and Cardizem On Coumadin for [facility] Check  INR Referred to cardiology      Relevant Medications   furosemide (LASIX) 40 MG tablet   Other Relevant Orders   Ambulatory referral to Cardiology   Atherosclerosis of aorta (HCC)    Noted on CT chest Not on statin currently Check lipid profile      Relevant Medications   furosemide (LASIX) 40 MG tablet   Other Relevant Orders   Lipid Profile     Respiratory   Pulmonary emphysema (HCC)    Noted on CT chest Former smoker Denies any dyspnea currently      Acute sinusitis    Has nasal congestion, postnasal drip and sore throat for the last 1 week Started empiric Augmentin Nasal saline spray as needed for nasal congestion      Relevant Medications   amoxicillin-clavulanate (AUGMENTIN) 875-125 MG tablet     Nervous and Auditory   Peripheral neuropathy    Has chronic numbness of the bilateral hands, could be due to carpal tunnel syndrome and/or peripheral neuropathy Takes Lyrica 300 mg twice daily Needs to decrease dose of Lyrica to avoid daytime drowsiness, they agreed to take Lyrica only at nighttime      Relevant Medications   zolpidem (AMBIEN) 10 MG tablet   Other Relevant Orders   TSH     Other   Chronic anticoagulation    On Coumadin due to A-fib Check INR and CBC      Relevant Orders   CBC with Differential/Platelet   INR/PT   Recurrent major depressive disorder, in partial remission (HCC)    Uncontrolled On Prozac 40 mg daily His hypersomnolence and chronic fatigue are contributing to MDD, need to decrease opioid medication burden      Relevant Orders   Ambulatory referral to Home Health   Insomnia    Takes Ambien 10 mg, but questionable twice daily dosing Will decrease to 10  mg nightly and only as needed      Chronic pain syndrome    Due to lumbar spine surgeries Has history of lumbar spinal stenosis On oxycodone 20 mg 3 times daily-since he has severe pain currently refilled oxycodone 20 mg 3 times daily for now, plan to decrease dose On  Lyrica 300 mg twice daily, advised to avoid daytime dose to avoid drowsiness      Relevant Medications   Oxycodone HCl 20 MG TABS   Chronic, continuous use of opioids   Lymphedema    Chronic leg swelling, likely lymphedema with excoriations On Lasix 20 mg daily, increased dose to 40 mg daily Check CMP and BNP Advised to perform leg elevation Compression socks Referred to home health for wound care and PT for lymphedema      Relevant Orders   Ambulatory referral to Home Health   Spinal stenosis of lumbar region with neurogenic claudication    S/p lumbar spine surgeries Has chronic low back pain with radicular symptoms On oxycodone and Lyrica currently      Relevant Medications   Oxycodone HCl 20 MG TABS   Other Relevant Orders   Ambulatory referral to Home Health   Other Visit Diagnoses     Hyperglycemia       Relevant Orders   Hemoglobin A1c       Outpatient Encounter Medications as of 08/30/2022  Medication Sig   amoxicillin-clavulanate (AUGMENTIN) 875-125 MG tablet Take 1 tablet by mouth 2 (two) times daily.   atenolol (TENORMIN) 25 MG tablet Take 1 tablet (25 mg total) by mouth daily.   diltiazem (CARDIZEM CD) 240 MG 24 hr capsule Take 1 capsule (240 mg total) by mouth daily.   fluticasone (FLONASE) 50 MCG/ACT nasal spray Place 1 spray into both nostrils daily.   pregabalin (LYRICA) 300 MG capsule Take 1 capsule (300 mg total) by mouth 2 (two) times daily.   warfarin (COUMADIN) 5 MG tablet Take 1 tablet (5 mg total) by mouth daily.   zolpidem (AMBIEN) 10 MG tablet Take 10 mg by mouth at bedtime as needed for sleep.   [DISCONTINUED] allopurinol (ZYLOPRIM) 300 MG tablet Take 300 mg by mouth daily.   [DISCONTINUED] furosemide (LASIX) 20 MG tablet Take 1 tablet (20 mg total) by mouth daily.   [DISCONTINUED] oxyCODONE 20 MG TABS Take 1 tablet (20 mg total) by mouth every 8 (eight) hours.   cetirizine (ZYRTEC) 10 MG tablet Take 10 mg by mouth daily.    diphenoxylate-atropine (LOMOTIL) 2.5-0.025 MG tablet Take 1-2 tablets by mouth 4 (four) times daily as needed for diarrhea or loose stools.   FLUoxetine (PROZAC) 20 MG capsule Take 40 mg by mouth daily.   furosemide (LASIX) 40 MG tablet Take 1 tablet (40 mg total) by mouth daily.   nystatin-triamcinolone (MYCOLOG II) cream Apply topically 2 (two) times daily.   Oxycodone HCl 20 MG TABS Take 1 tablet (20 mg total) by mouth every 8 (eight) hours.   potassium chloride (MICRO-K) 10 MEQ CR capsule Take 1 capsule (10 mEq total) by mouth daily.   [DISCONTINUED] Oxycodone HCl 20 MG TABS Take 1 tablet (20 mg total) by mouth every 8 (eight) hours.   No facility-administered encounter medications on file as of 08/30/2022.    Total time spent: 67 minutes  Follow-up: Return in about 2 weeks (around 09/13/2022).   Anabel Halon, MD

## 2022-08-30 NOTE — Assessment & Plan Note (Signed)
Takes Ambien 10 mg, but questionable twice daily dosing Will decrease to 10 mg nightly and only as needed

## 2022-08-30 NOTE — Assessment & Plan Note (Signed)
Rate controlled with atenolol and Cardizem On Coumadin for [facility] Check INR Referred to cardiology

## 2022-08-30 NOTE — Assessment & Plan Note (Signed)
S/p lumbar spine surgeries Has chronic low back pain with radicular symptoms On oxycodone and Lyrica currently

## 2022-08-30 NOTE — Assessment & Plan Note (Signed)
Has chronic numbness of the bilateral hands, could be due to carpal tunnel syndrome and/or peripheral neuropathy Takes Lyrica 300 mg twice daily Needs to decrease dose of Lyrica to avoid daytime drowsiness, they agreed to take Lyrica only at nighttime

## 2022-08-30 NOTE — Assessment & Plan Note (Signed)
Has nasal congestion, postnasal drip and sore throat for the last 1 week Started empiric Augmentin Nasal saline spray as needed for nasal congestion

## 2022-08-30 NOTE — Assessment & Plan Note (Signed)
Noted on CT chest Former smoker Denies any dyspnea currently

## 2022-08-30 NOTE — Patient Instructions (Addendum)
Please start taking Lasix 40 mg once daily instead of 20 mg.  Please start taking Augmentin as prescribed for sinusitis.  Please continue to take medications as prescribed.  Please continue to follow low salt diet and ambulate as tolerated.

## 2022-08-30 NOTE — Assessment & Plan Note (Signed)
Due to lumbar spine surgeries Has history of lumbar spinal stenosis On oxycodone 20 mg 3 times daily-since he has severe pain currently refilled oxycodone 20 mg 3 times daily for now, plan to decrease dose On Lyrica 300 mg twice daily, advised to avoid daytime dose to avoid drowsiness

## 2022-09-01 LAB — CMP14+EGFR
ALT: 8 IU/L (ref 0–44)
AST: 15 IU/L (ref 0–40)
Albumin: 3.4 g/dL — ABNORMAL LOW (ref 3.7–4.7)
Alkaline Phosphatase: 148 IU/L — ABNORMAL HIGH (ref 44–121)
BUN/Creatinine Ratio: 14 (ref 10–24)
BUN: 13 mg/dL (ref 8–27)
Bilirubin Total: 0.5 mg/dL (ref 0.0–1.2)
CO2: 27 mmol/L (ref 20–29)
Calcium: 8.8 mg/dL (ref 8.6–10.2)
Chloride: 104 mmol/L (ref 96–106)
Creatinine, Ser: 0.95 mg/dL (ref 0.76–1.27)
Globulin, Total: 3.2 g/dL (ref 1.5–4.5)
Glucose: 101 mg/dL — ABNORMAL HIGH (ref 70–99)
Potassium: 4.6 mmol/L (ref 3.5–5.2)
Sodium: 141 mmol/L (ref 134–144)
Total Protein: 6.6 g/dL (ref 6.0–8.5)
eGFR: 79 mL/min/{1.73_m2} (ref >59)

## 2022-09-01 LAB — CBC WITH DIFFERENTIAL/PLATELET
Basophils Absolute: 0.1 10*3/uL (ref 0.0–0.2)
Basos: 1 %
EOS (ABSOLUTE): 0.4 10*3/uL (ref 0.0–0.4)
Eos: 7 %
Hematocrit: 35.9 % — ABNORMAL LOW (ref 37.5–51.0)
Hemoglobin: 10.2 g/dL — ABNORMAL LOW (ref 13.0–17.7)
Immature Grans (Abs): 0 10*3/uL (ref 0.0–0.1)
Immature Granulocytes: 0 %
Lymphocytes Absolute: 1 10*3/uL (ref 0.7–3.1)
Lymphs: 17 %
MCH: 21.3 pg — ABNORMAL LOW (ref 26.6–33.0)
MCHC: 28.4 g/dL — ABNORMAL LOW (ref 31.5–35.7)
MCV: 75 fL — ABNORMAL LOW (ref 79–97)
Monocytes Absolute: 0.7 10*3/uL (ref 0.1–0.9)
Monocytes: 12 %
Neutrophils Absolute: 3.8 10*3/uL (ref 1.4–7.0)
Neutrophils: 63 %
Platelets: 222 10*3/uL (ref 150–450)
RBC: 4.78 x10E6/uL (ref 4.14–5.80)
RDW: 16.3 % — ABNORMAL HIGH (ref 11.6–15.4)
WBC: 5.9 10*3/uL (ref 3.4–10.8)

## 2022-09-01 LAB — LIPID PANEL
Chol/HDL Ratio: 3.4 ratio (ref 0.0–5.0)
Cholesterol, Total: 98 mg/dL — ABNORMAL LOW (ref 100–199)
HDL: 29 mg/dL — ABNORMAL LOW (ref >39)
LDL Chol Calc (NIH): 51 mg/dL (ref 0–99)
Triglycerides: 92 mg/dL (ref 0–149)
VLDL Cholesterol Cal: 18 mg/dL (ref 5–40)

## 2022-09-01 LAB — HEMOGLOBIN A1C
Est. average glucose Bld gHb Est-mCnc: 146 mg/dL
Hgb A1c MFr Bld: 6.7 % — ABNORMAL HIGH (ref 4.8–5.6)

## 2022-09-01 LAB — PROTIME-INR
INR: 2.3 — ABNORMAL HIGH (ref 0.9–1.2)
Prothrombin Time: 23.5 s — ABNORMAL HIGH (ref 9.1–12.0)

## 2022-09-01 LAB — MAGNESIUM: Magnesium: 1.9 mg/dL (ref 1.6–2.3)

## 2022-09-01 LAB — BRAIN NATRIURETIC PEPTIDE: BNP: 131.6 pg/mL — ABNORMAL HIGH (ref 0.0–100.0)

## 2022-09-01 LAB — TSH: TSH: 1.53 u[IU]/mL (ref 0.450–4.500)

## 2022-09-05 ENCOUNTER — Ambulatory Visit (HOSPITAL_COMMUNITY)
Admission: RE | Admit: 2022-09-05 | Discharge: 2022-09-05 | Disposition: A | Discharge status: Home / Self Care | Payer: Medicare HMO | Source: Ambulatory Visit | Attending: Family Medicine | Admitting: Family Medicine

## 2022-09-05 DIAGNOSIS — I499 Cardiac arrhythmia, unspecified: Secondary | ICD-10-CM | POA: Diagnosis not present

## 2022-09-05 DIAGNOSIS — I4891 Unspecified atrial fibrillation: Secondary | ICD-10-CM | POA: Insufficient documentation

## 2022-09-05 NOTE — Progress Notes (Signed)
*  PRELIMINARY RESULTS* Echocardiogram 2D Echocardiogram has been performed.  Stacey Drain 09/05/2022, 4:04 PM

## 2022-09-16 ENCOUNTER — Encounter: Payer: Self-pay | Admitting: Internal Medicine

## 2022-09-16 ENCOUNTER — Ambulatory Visit (INDEPENDENT_AMBULATORY_CARE_PROVIDER_SITE_OTHER): Payer: Medicare HMO | Admitting: Internal Medicine

## 2022-09-16 VITALS — BP 110/60 | HR 88 | Ht 68.0 in | Wt 207.0 lb

## 2022-09-16 DIAGNOSIS — E1169 Type 2 diabetes mellitus with other specified complication: Secondary | ICD-10-CM

## 2022-09-16 DIAGNOSIS — I10 Essential (primary) hypertension: Secondary | ICD-10-CM

## 2022-09-16 DIAGNOSIS — E119 Type 2 diabetes mellitus without complications: Secondary | ICD-10-CM | POA: Insufficient documentation

## 2022-09-16 DIAGNOSIS — E1140 Type 2 diabetes mellitus with diabetic neuropathy, unspecified: Secondary | ICD-10-CM | POA: Insufficient documentation

## 2022-09-16 DIAGNOSIS — M48062 Spinal stenosis, lumbar region with neurogenic claudication: Secondary | ICD-10-CM

## 2022-09-16 DIAGNOSIS — G894 Chronic pain syndrome: Secondary | ICD-10-CM

## 2022-09-16 DIAGNOSIS — G4701 Insomnia due to medical condition: Secondary | ICD-10-CM

## 2022-09-16 DIAGNOSIS — I890 Lymphedema, not elsewhere classified: Secondary | ICD-10-CM | POA: Diagnosis not present

## 2022-09-16 DIAGNOSIS — F3341 Major depressive disorder, recurrent, in partial remission: Secondary | ICD-10-CM

## 2022-09-16 DIAGNOSIS — I4821 Permanent atrial fibrillation: Secondary | ICD-10-CM

## 2022-09-16 MED ORDER — FLUOXETINE HCL 40 MG PO CAPS: 40.0000 mg | ORAL_CAPSULE | Freq: Every day | ORAL | 1 refills | Status: DC

## 2022-09-16 MED ORDER — PREGABALIN 300 MG PO CAPS: 300.0000 mg | ORAL_CAPSULE | Freq: Every day | ORAL | 1 refills | Status: DC

## 2022-09-16 MED ORDER — OXYCODONE HCL 10 MG PO TABS
15.0000 mg | ORAL_TABLET | Freq: Three times a day (TID) | ORAL | 0 refills | Status: DC | PRN
Start: 2022-09-16 — End: 2022-10-14

## 2022-09-16 NOTE — Progress Notes (Unsigned)
Established Patient Office Visit  Subjective:  Patient ID: Wayne Middleton, male    DOB: 09/20/1940  Age: 82 y.o. MRN: [id number]  CC:  Chief Complaint  Patient presents with   Atrial Fibrillation    Two week follow up     HPI Wayne Middleton is a 82 y.o. male with past medical history of atrial fibrillation, HFpEF, HTN, COPD, peripheral neuropathy, lumbar spinal stenosis s/p lumbar surgeries, chronic pain syndrome, MDD and lymphedema who presents for f/u of his chronic medical conditions.  Atrial fibrillation, HFpEF and HTN: He takes atenolol 25 mg daily and Cardizem to 40 mg daily.  He takes Lasix 40 mg daily for leg swelling and HFpEF.  He has chronic leg swelling, but has improved since increasing dose of Lasix.Marland Kitchen  He takes Coumadin for A-fib, but has not seen cardiology since 11/22.  Denies any chest pain, dyspnea or palpitations currently.  He has chronic lymphedema and has excoriations on his legs.  He had a leg wound on his right leg, for which he had home health nurse.  His leg wound has improved.   Lumbar spinal stenosis and chronic pain syndrome: He takes oxycodone 20 mg 3 times daily and Lyrica 300 mg qHS currently. He has severe, chronic low back pain, which radiates to bilateral LE.  He has limited ambulation due to severe low back pain.  He has chronic numbness of the hands, for which he takes Lyrica.  Of note, his son reports that he is less tired and sleepy since decreasing frequency of Lyrica to qHS only.  I had lengthy discussion about potential need to decrease the dose of opioids to decrease his hypersomnolence.  MDD: He takes Prozac 40 mg daily currently.  He lives alone.  He is independent with his ADLs and his sons help him with groceries.  He has insomnia at nighttime and takes Ambien 10 mg QD for it.    Past Medical History:  Diagnosis Date   Acute metabolic encephalopathy 04/17/2022   Acute respiratory failure (HCC) 04/09/2022   Atrial fibrillation (HCC)    CAP  (community acquired pneumonia) 04/09/2022   Cardiomyopathy (HCC)    Tachycardia mediated, LVEF improved to 50-55% as of 2012   Cholelithiasis    Chronic anticoagulation    Chronic anxiety    Diabetes (HCC)    Essential hypertension    History of GI bleed 2011   Hospitalized for lower GI bleed following polypectomy   Peripheral neuropathy 11/11/2018    Past Surgical History:  Procedure Laterality Date   BACK SURGERY  1971   post-trauma   COLONOSCOPY W/ POLYPECTOMY  2006, 2011   postprocedure bleed requiring hospital admission   TUMOR EXCISION  1960s   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    Family History  Problem Relation Age of Onset   Stroke Mother 76   Heart attack Father 52   Lung cancer Brother        3 of 5 brothers deceased due to Carcinoma of the lung    Social History   Socioeconomic History   Marital status: Widowed    Spouse name: Not on file   Number of children: Not on file   Years of education: Not on file   Highest education level: Not on file  Occupational History   Occupation: Retired from [employer]  Tobacco Use   Smoking status: Never   Smokeless tobacco: Never  Vaping Use   Vaping status: Never Used  Substance and Sexual Activity   Alcohol use: No    Alcohol/week: 0.0 standard drinks of alcohol   Drug use: No   Sexual activity: Not on file  Other Topics Concern   Not on file  Social History Narrative   Patient previously walked four miles a day   Social Determinants of Health   Financial Resource Strain: Not on file  Food Insecurity: No Food Insecurity (04/10/2022)   Hunger Vital Sign    Worried About Running Out of Food in the Last Year: Never true    Ran Out of Food in the Last Year: Never true  Transportation Needs: No Transportation Needs (04/10/2022)   PRAPARE - Administrator, Civil Service (Medical): No    Lack of Transportation (Non-Medical): No  Physical Activity: Not on file  Stress: Not on  file  Social Connections: Not on file  Intimate Partner Violence: Not At Risk (04/10/2022)   Humiliation, Afraid, Rape, and Kick questionnaire    Fear of Current or Ex-Partner: No    Emotionally Abused: No    Physically Abused: No    Sexually Abused: No    Outpatient Medications Prior to Visit  Medication Sig Dispense Refill   atenolol (TENORMIN) 25 MG tablet Take 1 tablet (25 mg total) by mouth daily.     cetirizine (ZYRTEC) 10 MG tablet Take 10 mg by mouth daily.     diltiazem (CARDIZEM CD) 240 MG 24 hr capsule Take 1 capsule (240 mg total) by mouth daily. 90 capsule 0   diphenoxylate-atropine (LOMOTIL) 2.5-0.025 MG tablet Take 1-2 tablets by mouth 4 (four) times daily as needed for diarrhea or loose stools.     fluticasone (FLONASE) 50 MCG/ACT nasal spray Place 1 spray into both nostrils daily.     furosemide (LASIX) 40 MG tablet Take 1 tablet (40 mg total) by mouth daily. 30 tablet 3   nystatin-triamcinolone (MYCOLOG II) cream Apply topically 2 (two) times daily.     potassium chloride (MICRO-K) 10 MEQ CR capsule Take 1 capsule (10 mEq total) by mouth daily. 30 capsule 1   warfarin (COUMADIN) 5 MG tablet Take 1 tablet (5 mg total) by mouth daily.     zolpidem (AMBIEN) 10 MG tablet Take 10 mg by mouth at bedtime as needed for sleep.     amoxicillin-clavulanate (AUGMENTIN) 875-125 MG tablet Take 1 tablet by mouth 2 (two) times daily. 14 tablet 0   FLUoxetine (PROZAC) 20 MG capsule Take 40 mg by mouth daily.     Oxycodone HCl 20 MG TABS Take 1 tablet (20 mg total) by mouth every 8 (eight) hours. 45 tablet 0   pregabalin (LYRICA) 300 MG capsule Take 1 capsule (300 mg total) by mouth 2 (two) times daily. 10 capsule 0   No facility-administered medications prior to visit.    No Known Allergies  ROS Review of Systems  Constitutional:  Positive for fatigue. Negative for chills and fever.  HENT:  Negative for congestion, postnasal drip, sinus pressure and sore throat.   Eyes:  Negative  for pain and discharge.  Respiratory:  Negative for cough and shortness of breath.   Cardiovascular:  Positive for leg swelling. Negative for chest pain and palpitations.  Gastrointestinal:  Negative for diarrhea, nausea and vomiting.  Endocrine: Negative for polydipsia and polyuria.  Genitourinary:  Negative for dysuria and hematuria.  Musculoskeletal:  Positive for arthralgias and back pain. Negative for neck pain and neck stiffness.  Skin:  Positive for color change (Redness  over bilateral legs, chronic). Negative for rash.  Neurological:  Positive for weakness. Negative for numbness and headaches.  Psychiatric/Behavioral:  Positive for sleep disturbance. Negative for agitation and behavioral problems. The patient is nervous/anxious.       Objective:    Physical Exam Vitals reviewed.  Constitutional:      General: He is not in acute distress.    Appearance: He is not diaphoretic.     Comments: In wheelchair  HENT:     Head: Normocephalic and atraumatic.     Nose: Nose normal.     Mouth/Throat:     Mouth: Mucous membranes are moist.  Eyes:     General: No scleral icterus.    Extraocular Movements: Extraocular movements intact.  Cardiovascular:     Rate and Rhythm: Normal rate and regular rhythm.     Heart sounds: Normal heart sounds. No murmur heard. Pulmonary:     Breath sounds: Normal breath sounds. No wheezing or rales.  Musculoskeletal:     Cervical back: Neck supple. No tenderness.     Right lower leg: Edema (1+) present.     Left lower leg: Edema (1+) present.  Skin:    General: Skin is warm.     Findings: Rash (Erythema over bilateral legs up to knee, with excoriations) present.  Neurological:     General: No focal deficit present.     Mental Status: He is alert and oriented to person, place, and time.  Psychiatric:        Mood and Affect: Mood normal.        Behavior: Behavior normal.     BP 110/60 (BP Location: Left Arm, Patient Position: Sitting, Cuff  Size: Large)   Pulse 88   Ht 5\' 8"  (1.727 m)   Wt 207 lb (93.9 kg)   SpO2 92%   BMI 31.47 kg/m  Wt Readings from Last 3 Encounters:  09/16/22 207 lb (93.9 kg)  04/17/22 217 lb 13 oz (98.8 kg)  04/09/22 230 lb (104.3 kg)    Lab Results  Component Value Date   TSH 1.530 08/30/2022   Lab Results  Component Value Date   WBC 5.9 08/30/2022   HGB 10.2 (L) 08/30/2022   HCT 35.9 (L) 08/30/2022   MCV 75 (L) 08/30/2022   PLT 222 08/30/2022   Lab Results  Component Value Date   NA 141 08/30/2022   K 4.6 08/30/2022   CO2 27 08/30/2022   GLUCOSE 101 (H) 08/30/2022   BUN 13 08/30/2022   CREATININE 0.95 08/30/2022   BILITOT 0.5 08/30/2022   ALKPHOS 148 (H) 08/30/2022   AST 15 08/30/2022   ALT 8 08/30/2022   PROT 6.6 08/30/2022   ALBUMIN 3.4 (L) 08/30/2022   CALCIUM 8.8 08/30/2022   ANIONGAP 6 04/19/2022   EGFR 79 08/30/2022   Lab Results  Component Value Date   CHOL 98 (L) 08/30/2022   Lab Results  Component Value Date   HDL 29 (L) 08/30/2022   Lab Results  Component Value Date   LDLCALC 51 08/30/2022   Lab Results  Component Value Date   TRIG 92 08/30/2022   Lab Results  Component Value Date   CHOLHDL 3.4 08/30/2022   Lab Results  Component Value Date   HGBA1C 6.7 (H) 08/30/2022      Assessment & Plan:   Problem List Items Addressed This Visit       Cardiovascular and Mediastinum   Essential hypertension, benign    BP Readings from Last  1 Encounters:  09/16/22 110/60   Well-controlled with atenolol and Cardizem Counseled for compliance with the medications Advised DASH diet and ambulate as tolerated      Permanent atrial fibrillation (HCC)    Rate controlled with atenolol and Cardizem On Coumadin for [facility] Check INR Referred to cardiology        Endocrine   Diabetes mellitus (HCC)    Lab Results  Component Value Date   HGBA1C 6.7 (H) 08/30/2022    New onset, borderline Associated with HTN and HLD Would avoid adding any new medicine  for now considering his age and borderline DM Advised to follow diabetic diet F/u CMP and lipid panel Diabetic foot exam: Today Diabetic eye exam: Advised to follow up with Ophthalmology for diabetic eye exam        Other   Recurrent major depressive disorder, in partial remission (HCC)    Uncontrolled On Prozac 40 mg daily His hypersomnolence and chronic fatigue are contributing to MDD, need to decrease opioid medication burden      Relevant Medications   FLUoxetine (PROZAC) 40 MG capsule   Insomnia - Primary    Takes Ambien 10 mg at bedtime PRN      Chronic pain syndrome    Due to lumbar spine surgeries Has history of lumbar spinal stenosis On oxycodone 20 mg 3 times daily- he has chronic severe pain, refilled oxycodone at a lower dose of 15 mg 3 times daily for now, plan to decrease dose to improve daytime fatigue and lethargy On Lyrica 300 mg at bedtime, his fatigue and drowsiness have improved since decreasing frequency of Lyrica      Relevant Medications   FLUoxetine (PROZAC) 40 MG capsule   Oxycodone HCl 10 MG TABS   pregabalin (LYRICA) 300 MG capsule   Lymphedema    Chronic leg swelling, likely lymphedema with excoriations On Lasix 40 mg daily Checked CMP and BNP Advised to perform leg elevation Compression socks Referred to home health for wound care and PT for lymphedema      Spinal stenosis of lumbar region with neurogenic claudication    S/p lumbar spine surgeries Has chronic low back pain with radicular symptoms On oxycodone and Lyrica currently      Relevant Medications   FLUoxetine (PROZAC) 40 MG capsule   Oxycodone HCl 10 MG TABS   pregabalin (LYRICA) 300 MG capsule    Meds ordered this encounter  Medications   FLUoxetine (PROZAC) 40 MG capsule    Sig: Take 1 capsule (40 mg total) by mouth daily.    Dispense:  90 capsule    Refill:  1   Oxycodone HCl 10 MG TABS    Sig: Take 1.5 tablets (15 mg total) by mouth 3 (three) times daily as needed.     Dispense:  135 tablet    Refill:  0   pregabalin (LYRICA) 300 MG capsule    Sig: Take 1 capsule (300 mg total) by mouth at bedtime.    Dispense:  90 capsule    Refill:  1    Follow-up: Return in about 3 months (around 12/17/2022) for HTN and HFpEF.    Anabel Halon, MD

## 2022-09-16 NOTE — Assessment & Plan Note (Signed)
S/p lumbar spine surgeries Has chronic low back pain with radicular symptoms On oxycodone and Lyrica currently 

## 2022-09-16 NOTE — Assessment & Plan Note (Addendum)
Chronic leg swelling, likely lymphedema with excoriations On Lasix 40 mg daily Checked CMP and BNP Advised to perform leg elevation Compression socks Referred to home health for wound care and PT for lymphedema

## 2022-09-16 NOTE — Patient Instructions (Signed)
Please start taking Oxycodone 10-15 mg 3 times in a day.  Please take Lyrica 300 mg only at bedtime.  Please take Prozac 40 once daily as prescribed.  Please continue to take other medications as prescribed.  Please continue to follow low salt diet and ambulate as tolerated.

## 2022-09-19 NOTE — Assessment & Plan Note (Signed)
Takes Ambien 10 mg at bedtime PRN

## 2022-09-19 NOTE — Assessment & Plan Note (Signed)
Lab Results  Component Value Date   HGBA1C 6.7 (H) 08/30/2022    New onset, borderline Associated with HTN and HLD Would avoid adding any new medicine for now considering his age and borderline DM Advised to follow diabetic diet F/u CMP and lipid panel Diabetic foot exam: Today Diabetic eye exam: Advised to follow up with Ophthalmology for diabetic eye exam

## 2022-09-19 NOTE — Assessment & Plan Note (Signed)
Uncontrolled On Prozac 40 mg daily His hypersomnolence and chronic fatigue are contributing to MDD, need to decrease opioid medication burden 

## 2022-09-19 NOTE — Assessment & Plan Note (Signed)
BP Readings from Last 1 Encounters:  09/16/22 110/60   Well-controlled with atenolol and Cardizem Counseled for compliance with the medications Advised DASH diet and ambulate as tolerated

## 2022-09-19 NOTE — Assessment & Plan Note (Signed)
Due to lumbar spine surgeries Has history of lumbar spinal stenosis On oxycodone 20 mg 3 times daily- he has chronic severe pain, refilled oxycodone at a lower dose of 15 mg 3 times daily for now, plan to decrease dose to improve daytime fatigue and lethargy On Lyrica 300 mg at bedtime, his fatigue and drowsiness have improved since decreasing frequency of Lyrica

## 2022-09-19 NOTE — Assessment & Plan Note (Signed)
Rate controlled with atenolol and Cardizem On Coumadin for AC Check INR Referred to cardiology 

## 2022-10-04 ENCOUNTER — Other Ambulatory Visit: Payer: Self-pay

## 2022-10-04 ENCOUNTER — Telehealth: Payer: Self-pay | Admitting: Internal Medicine

## 2022-10-04 MED ORDER — ATENOLOL 25 MG PO TABS: 25.0000 mg | ORAL_TABLET | Freq: Every day | ORAL | 0 refills | Status: DC

## 2022-10-04 NOTE — Telephone Encounter (Signed)
Prescription Request  10/04/2022  LOV: 09/16/2022  What is the name of the medication or equipment? atenolol (TENORMIN) 25 MG tablet [[id number]]    Have you contacted your pharmacy to request a refill? Yes   Which pharmacy would you like this sent to?   [facility]  [address]  [place], [place] [address]  Phone [phone] Fax [phone]     Patient notified that their request is being sent to the clinical staff for review and that they should receive a response within 2 business days.   Please advise at [facility] [phone]

## 2022-10-04 NOTE — Telephone Encounter (Signed)
Refills sent to pharmacy. 

## 2022-10-08 ENCOUNTER — Other Ambulatory Visit: Payer: Self-pay | Admitting: Internal Medicine

## 2022-10-11 ENCOUNTER — Telehealth: Payer: Self-pay | Admitting: Internal Medicine

## 2022-10-11 NOTE — Telephone Encounter (Signed)
Patient returning call but could not understand what the nurse was saying on voicemail. [phone]

## 2022-10-14 ENCOUNTER — Other Ambulatory Visit: Payer: Self-pay | Admitting: Internal Medicine

## 2022-10-14 DIAGNOSIS — G4701 Insomnia due to medical condition: Secondary | ICD-10-CM

## 2022-10-14 DIAGNOSIS — G894 Chronic pain syndrome: Secondary | ICD-10-CM

## 2022-10-14 DIAGNOSIS — M48062 Spinal stenosis, lumbar region with neurogenic claudication: Secondary | ICD-10-CM

## 2022-10-14 MED ORDER — ZOLPIDEM TARTRATE 10 MG PO TABS
10.0000 mg | ORAL_TABLET | Freq: Every evening | ORAL | 5 refills | Status: DC | PRN
Start: 2022-10-14 — End: 2023-03-24

## 2022-10-14 MED ORDER — OXYCODONE HCL 10 MG PO TABS
15.0000 mg | ORAL_TABLET | Freq: Three times a day (TID) | ORAL | 0 refills | Status: DC | PRN
Start: 2022-10-14 — End: 2022-11-19

## 2022-10-17 ENCOUNTER — Telehealth: Payer: Self-pay | Admitting: Internal Medicine

## 2022-10-17 NOTE — Telephone Encounter (Signed)
Refills sent on 10/14/2022

## 2022-10-17 NOTE — Telephone Encounter (Signed)
Prescription Request  10/17/2022  LOV: 09/16/2022  What is the name of the medication or equipment? Oxycodone HCl 10 MG TABS [[id number]]   ondansetron (ZOFRAN) tablet 4 mg     zolpidem (AMBIEN) 10 MG tablet [[id number]]    Have you contacted your pharmacy to request a refill? Yes   Which pharmacy would you like this sent to?  [facility] - [place], [place] - [address] [place] [address] Phone: [phone] Fax: [phone]    Patient notified that their request is being sent to the clinical staff for review and that they should receive a response within 2 business days.   Please advise at [facility] [phone]

## 2022-10-18 ENCOUNTER — Other Ambulatory Visit: Payer: Self-pay | Admitting: Internal Medicine

## 2022-10-18 DIAGNOSIS — R110 Nausea: Secondary | ICD-10-CM

## 2022-10-18 MED ORDER — ONDANSETRON HCL 4 MG PO TABS: 4.0000 mg | ORAL_TABLET | Freq: Three times a day (TID) | ORAL | 0 refills | Status: DC | PRN

## 2022-10-18 NOTE — Telephone Encounter (Signed)
Raiford Noble (son) advised

## 2022-10-18 NOTE — Telephone Encounter (Signed)
Pt son LVM 8/7 3:53 says [facility] has not received anything. Please advise Thank you

## 2022-10-18 NOTE — Telephone Encounter (Signed)
LVM on 8/8, sorry

## 2022-11-18 ENCOUNTER — Telehealth: Payer: Self-pay | Admitting: Internal Medicine

## 2022-11-18 NOTE — Telephone Encounter (Signed)
Prescription Request  11/18/2022  LOV: 09/16/2022  What is the name of the medication or equipment? zolpidem (AMBIEN) 10 MG tablet [[id number]]   pregabalin (LYRICA) 300 MG capsule [[id number]]   Oxycodone HCl 10 MG TABS [[id number]]    Have you contacted your pharmacy to request a refill? No   Which pharmacy would you like this sent to?  [facility] - [place], [place] - [address] [place] [address] Phone: [phone] Fax: [phone]    Patient notified that their request is being sent to the clinical staff for review and that they should receive a response within 2 business days.   Please advise at [facility] [phone] (mobile)

## 2022-11-19 ENCOUNTER — Other Ambulatory Visit: Payer: Self-pay | Admitting: Internal Medicine

## 2022-11-19 DIAGNOSIS — G894 Chronic pain syndrome: Secondary | ICD-10-CM

## 2022-11-19 DIAGNOSIS — M48062 Spinal stenosis, lumbar region with neurogenic claudication: Secondary | ICD-10-CM

## 2022-11-19 MED ORDER — OXYCODONE HCL 10 MG PO TABS
15.0000 mg | ORAL_TABLET | Freq: Three times a day (TID) | ORAL | 0 refills | Status: DC | PRN
Start: 2022-11-19 — End: 2022-12-17

## 2022-11-19 MED ORDER — PREGABALIN 300 MG PO CAPS
300.0000 mg | ORAL_CAPSULE | Freq: Every day | ORAL | 5 refills | Status: DC
Start: 2022-11-19 — End: 2022-12-17

## 2022-11-19 NOTE — Telephone Encounter (Signed)
Darrell (son) advised

## 2022-11-27 ENCOUNTER — Ambulatory Visit: Payer: Medicare HMO

## 2022-12-17 ENCOUNTER — Encounter: Payer: Self-pay | Admitting: Internal Medicine

## 2022-12-17 ENCOUNTER — Ambulatory Visit (INDEPENDENT_AMBULATORY_CARE_PROVIDER_SITE_OTHER): Payer: Medicare HMO | Admitting: Internal Medicine

## 2022-12-17 VITALS — BP 109/61 | HR 86 | Ht 68.0 in | Wt 207.0 lb

## 2022-12-17 DIAGNOSIS — Z7901 Long term (current) use of anticoagulants: Secondary | ICD-10-CM

## 2022-12-17 DIAGNOSIS — F1190 Opioid use, unspecified, uncomplicated: Secondary | ICD-10-CM

## 2022-12-17 DIAGNOSIS — M48062 Spinal stenosis, lumbar region with neurogenic claudication: Secondary | ICD-10-CM

## 2022-12-17 DIAGNOSIS — G894 Chronic pain syndrome: Secondary | ICD-10-CM | POA: Diagnosis not present

## 2022-12-17 DIAGNOSIS — D62 Acute posthemorrhagic anemia: Secondary | ICD-10-CM

## 2022-12-17 DIAGNOSIS — F3341 Major depressive disorder, recurrent, in partial remission: Secondary | ICD-10-CM

## 2022-12-17 DIAGNOSIS — E1169 Type 2 diabetes mellitus with other specified complication: Secondary | ICD-10-CM

## 2022-12-17 DIAGNOSIS — I10 Essential (primary) hypertension: Secondary | ICD-10-CM

## 2022-12-17 DIAGNOSIS — I4821 Permanent atrial fibrillation: Secondary | ICD-10-CM | POA: Diagnosis not present

## 2022-12-17 MED ORDER — PREGABALIN 200 MG PO CAPS: 200.0000 mg | ORAL_CAPSULE | Freq: Two times a day (BID) | ORAL | 3 refills | Status: DC

## 2022-12-17 MED ORDER — OXYCODONE HCL 10 MG PO TABS
15.0000 mg | ORAL_TABLET | Freq: Three times a day (TID) | ORAL | 0 refills | Status: DC | PRN
Start: 2022-12-17 — End: 2023-01-13

## 2022-12-17 NOTE — Assessment & Plan Note (Signed)
Due to lumbar spine surgeries Has history of lumbar spinal stenosis On oxycodone 15 mg 3 times daily- he has chronic severe pain, refilled oxycodone for now, plan to decrease dose to improve daytime fatigue and lethargy On Lyrica 300 mg at bedtime, his fatigue and drowsiness have improved since decreasing frequency of Lyrica

## 2022-12-17 NOTE — Assessment & Plan Note (Signed)
Lab Results  Component Value Date   HGBA1C 6.7 (H) 08/30/2022    New onset, borderline Associated with HTN and HLD Would avoid adding any new medicine for now considering his age and borderline DM Advised to follow diabetic diet F/u CMP and HbA1c Diabetic eye exam: Advised to follow up with Ophthalmology for diabetic eye exam

## 2022-12-17 NOTE — Progress Notes (Unsigned)
Established Patient Office Visit  Subjective:  Patient ID: Wayne Middleton, male    DOB: [birth year]  Age: 82 y.o. MRN: [id number]  CC:  Chief Complaint  Patient presents with   Hypertension    Three month follow up     HPI [name] is a 82 y.o. male with past medical history of atrial fibrillation, HFpEF, HTN, COPD, peripheral neuropathy, lumbar spinal stenosis s/p lumbar surgeries, chronic pain syndrome, MDD and lymphedema who presents for f/u of his chronic medical conditions.  Atrial fibrillation, HFpEF and HTN: He takes atenolol 25 mg daily and Cardizem to 40 mg daily.  He takes Lasix 40 mg daily for leg swelling and HFpEF.  He has chronic leg swelling, but has improved since increasing dose of Lasix.Marland Kitchen  He takes Coumadin for A-fib, but has not seen cardiology since 11/22.  He was referred to cardiology, but did not respond to their call. Denies any chest pain, dyspnea or palpitations currently.   Lumbar spinal stenosis and chronic pain syndrome: He takes oxycodone 15 mg 3 times daily and Lyrica 300 mg qHS currently. He has severe, chronic low back pain, which radiates to bilateral LE.  He has limited ambulation due to severe low back pain.  He has chronic numbness of the hands, for which he takes Lyrica.  Of note, his son reports that he is less tired and sleepy since decreasing dose of oxycodone.  He asks to increase the dose of oxycodone back to 20 mg because he has worsening of back pain since decreasing dose to 15 mg. I had lengthy discussion about potential need to decrease the dose of opioids or at least not increasing dose to avoid hypersomnolence/drowsiness.  MDD: He takes Prozac 40 mg daily currently.  He lives alone.  He is independent with his ADLs and his sons help him with groceries.  He has insomnia at nighttime and takes Ambien 10 mg QD for it.    Past Medical History:  Diagnosis Date   Acute metabolic encephalopathy 04/17/2022   Acute respiratory failure (HCC)  04/09/2022   Atrial fibrillation (HCC)    CAP (community acquired pneumonia) 04/09/2022   Cardiomyopathy (HCC)    Tachycardia mediated, LVEF improved to 50-55% as of 2012   Cholelithiasis    Chronic anticoagulation    Chronic anxiety    Diabetes (HCC)    Essential hypertension    History of GI bleed 2011   Hospitalized for lower GI bleed following polypectomy   Peripheral neuropathy 11/11/2018    Past Surgical History:  Procedure Laterality Date   BACK SURGERY  1971   post-trauma   COLONOSCOPY W/ POLYPECTOMY  2006, 2011   postprocedure bleed requiring hospital admission   TUMOR EXCISION  1960s   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    Family History  Problem Relation Age of Onset   Stroke Mother 83   Heart attack Father 69   Lung cancer Brother        3 of 5 brothers deceased due to Carcinoma of the lung    Social History   Socioeconomic History   Marital status: Widowed    Spouse name: Not on file   Number of children: Not on file   Years of education: Not on file   Highest education level: Not on file  Occupational History   Occupation: Retired from [employer]  Tobacco Use   Smoking status: Never   Smokeless tobacco: Never  Vaping Use   Vaping  status: Never Used  Substance and Sexual Activity   Alcohol use: No    Alcohol/week: 0.0 standard drinks of alcohol   Drug use: No   Sexual activity: Not on file  Other Topics Concern   Not on file  Social History Narrative   Patient previously walked four miles a day   Social Determinants of Health   Financial Resource Strain: Not on file  Food Insecurity: No Food Insecurity (04/10/2022)   Hunger Vital Sign    Worried About Running Out of Food in the Last Year: Never true    Ran Out of Food in the Last Year: Never true  Transportation Needs: No Transportation Needs (04/10/2022)   PRAPARE - Administrator, Civil Service (Medical): No    Lack of Transportation (Non-Medical): No   Physical Activity: Not on file  Stress: Not on file  Social Connections: Not on file  Intimate Partner Violence: Not At Risk (04/10/2022)   Humiliation, Afraid, Rape, and Kick questionnaire    Fear of Current or Ex-Partner: No    Emotionally Abused: No    Physically Abused: No    Sexually Abused: No    Outpatient Medications Prior to Visit  Medication Sig Dispense Refill   atenolol (TENORMIN) 25 MG tablet Take 1 tablet (25 mg total) by mouth daily. 90 tablet 0   cetirizine (ZYRTEC) 10 MG tablet Take 10 mg by mouth daily.     diltiazem (CARDIZEM CD) 240 MG 24 hr capsule Take 1 capsule (240 mg total) by mouth daily. 90 capsule 0   diphenoxylate-atropine (LOMOTIL) 2.5-0.025 MG tablet Take 1-2 tablets by mouth 4 (four) times daily as needed for diarrhea or loose stools.     FLUoxetine (PROZAC) 40 MG capsule Take 1 capsule (40 mg total) by mouth daily. (Patient taking differently: Take 40 mg by mouth 2 (two) times daily.) 90 capsule 1   fluticasone (FLONASE) 50 MCG/ACT nasal spray Place 1 spray into both nostrils daily.     furosemide (LASIX) 40 MG tablet Take 1 tablet (40 mg total) by mouth daily. 30 tablet 3   potassium chloride (MICRO-K) 10 MEQ CR capsule Take 1 capsule (10 mEq total) by mouth daily. 30 capsule 1   warfarin (COUMADIN) 5 MG tablet Take 1 tablet (5 mg total) by mouth daily.     zolpidem (AMBIEN) 10 MG tablet Take 1 tablet (10 mg total) by mouth at bedtime as needed for sleep. 30 tablet 5   nystatin-triamcinolone (MYCOLOG II) cream Apply topically 2 (two) times daily. (Patient not taking: Reported on 12/18/2022)     ondansetron (ZOFRAN) 4 MG tablet Take 1 tablet (4 mg total) by mouth every 8 (eight) hours as needed for nausea or vomiting. (Patient not taking: Reported on 12/18/2022) 20 tablet 0   Oxycodone HCl 10 MG TABS Take 1.5 tablets (15 mg total) by mouth 3 (three) times daily as needed. 135 tablet 0   pregabalin (LYRICA) 300 MG capsule Take 1 capsule (300 mg total) by mouth  at bedtime. 30 capsule 5   No facility-administered medications prior to visit.    No Known Allergies  ROS Review of Systems  Constitutional:  Positive for fatigue. Negative for chills and fever.  HENT:  Negative for congestion, postnasal drip, sinus pressure and sore throat.   Eyes:  Negative for pain and discharge.  Respiratory:  Negative for cough and shortness of breath.   Cardiovascular:  Positive for leg swelling. Negative for chest pain and palpitations.  Gastrointestinal:  Negative for diarrhea, nausea and vomiting.  Endocrine: Negative for polydipsia and polyuria.  Genitourinary:  Negative for dysuria and hematuria.  Musculoskeletal:  Positive for arthralgias and back pain. Negative for neck pain and neck stiffness.  Skin:  Positive for color change (Redness over bilateral legs, chronic). Negative for rash.  Neurological:  Positive for weakness. Negative for numbness and headaches.  Psychiatric/Behavioral:  Positive for sleep disturbance. Negative for agitation and behavioral problems. The patient is nervous/anxious.       Objective:    Physical Exam Vitals reviewed.  Constitutional:      General: He is not in acute distress.    Appearance: He is not diaphoretic.     Comments: In wheelchair  HENT:     Head: Normocephalic and atraumatic.     Nose: Nose normal.     Mouth/Throat:     Mouth: Mucous membranes are moist.  Eyes:     General: No scleral icterus.    Extraocular Movements: Extraocular movements intact.  Cardiovascular:     Rate and Rhythm: Normal rate and regular rhythm.     Heart sounds: Normal heart sounds. No murmur heard. Pulmonary:     Breath sounds: Normal breath sounds. No wheezing or rales.  Musculoskeletal:     Cervical back: Neck supple. No tenderness.     Right lower leg: Edema (1+) present.     Left lower leg: Edema (1+) present.  Skin:    General: Skin is warm.     Findings: Rash (Erythema over bilateral legs up to knee, with  excoriations) present.  Neurological:     General: No focal deficit present.     Mental Status: He is alert and oriented to person, place, and time.  Psychiatric:        Mood and Affect: Mood normal.        Behavior: Behavior normal.     BP 109/61 (BP Location: Right Arm, Patient Position: Sitting, Cuff Size: Normal)   Pulse 86   Ht 5\' 8"  (1.727 m)   Wt 207 lb (93.9 kg)   SpO2 91%   BMI 31.47 kg/m  Wt Readings from Last 3 Encounters:  12/18/22 161 lb (73 kg)  12/17/22 207 lb (93.9 kg)  09/16/22 207 lb (93.9 kg)    Lab Results  Component Value Date   TSH 1.530 08/30/2022   Lab Results  Component Value Date   WBC 7.5 12/18/2022   HGB 6.3 (LL) 12/18/2022   HCT 25.3 (L) 12/18/2022   MCV 60.8 (L) 12/18/2022   PLT 257 12/18/2022   Lab Results  Component Value Date   NA 136 12/18/2022   K 3.9 12/18/2022   CO2 28 12/18/2022   GLUCOSE 209 (H) 12/18/2022   BUN 21 12/18/2022   CREATININE 1.18 12/18/2022   BILITOT 0.5 08/30/2022   ALKPHOS 148 (H) 08/30/2022   AST 15 08/30/2022   ALT 8 08/30/2022   PROT 6.6 08/30/2022   ALBUMIN 3.4 (L) 08/30/2022   CALCIUM 8.4 (L) 12/18/2022   ANIONGAP 8 12/18/2022   EGFR 79 08/30/2022   Lab Results  Component Value Date   CHOL 98 (L) 08/30/2022   Lab Results  Component Value Date   HDL 29 (L) 08/30/2022   Lab Results  Component Value Date   LDLCALC 51 08/30/2022   Lab Results  Component Value Date   TRIG 92 08/30/2022   Lab Results  Component Value Date   CHOLHDL 3.4 08/30/2022   Lab Results  Component Value  Date   HGBA1C 6.4 (H) 12/17/2022      Assessment & Plan:   Problem List Items Addressed This Visit       Cardiovascular and Mediastinum   Essential hypertension, benign - Primary    BP Readings from Last 1 Encounters:  12/17/22 109/61   Well-controlled with atenolol and Cardizem Counseled for compliance with the medications Advised DASH diet and ambulate as tolerated      Permanent atrial  fibrillation (HCC)    Rate controlled with atenolol and Cardizem On Coumadin for [facility] Check INR Referred to cardiology again      Relevant Orders   Ambulatory referral to Cardiology     Endocrine   Diabetes mellitus (HCC)    Lab Results  Component Value Date   HGBA1C 6.4 (H) 12/17/2022    New onset, borderline Associated with HTN and HLD Would avoid adding any new medicine for now considering his age and borderline DM Advised to follow diabetic diet F/u CMP and HbA1c Diabetic eye exam: Advised to follow up with Ophthalmology for diabetic eye exam      Relevant Orders   Hemoglobin A1c (Completed)   Urine Microalbumin w/creat. ratio (Completed)     Other   Chronic anticoagulation    On Coumadin due to A-fib Check INR and CBC      Relevant Orders   CBC with Differential/Platelet (Completed)   INR/PT (Completed)   Recurrent major depressive disorder, in partial remission (HCC)    Uncontrolled, but improving Although his PHQ-9 score is high, his sons report that he has no crying spells now and does not appear depressed at home On Prozac 40 mg daily His chronic pain and lack of mobility are also contributing to MDD      Chronic pain syndrome    Due to lumbar spine surgeries Has history of lumbar spinal stenosis On oxycodone 15 mg 3 times daily- he has chronic severe pain, refilled oxycodone for now, avoided increasing dose of oxycodone to improve daytime fatigue and lethargy On Lyrica 300 mg at bedtime Considering worsening of pain, increased dose of Lyrica to 200 mg twice daily now      Relevant Medications   pregabalin (LYRICA) 200 MG capsule   Oxycodone HCl 10 MG TABS   Other Relevant Orders   ToxASSURE Select 13 (MW), Urine   Chronic, continuous use of opioids   Relevant Orders   ToxASSURE Select 13 (MW), Urine   Spinal stenosis of lumbar region with neurogenic claudication    S/p lumbar spine surgeries Has chronic low back pain with radicular symptoms On  oxycodone and Lyrica currently      Relevant Medications   pregabalin (LYRICA) 200 MG capsule   Oxycodone HCl 10 MG TABS   ABLA (acute blood loss anemia)    Addendum:  His Hb dropped to 6.3 from 10.2 in 4 months Called and advised to go to ER for PRBC transfusion and evaluation of acute blood loss anemia      Refused influenza vaccine.   Meds ordered this encounter  Medications   pregabalin (LYRICA) 200 MG capsule    Sig: Take 1 capsule (200 mg total) by mouth 2 (two) times daily.    Dispense:  60 capsule    Refill:  3    Dose change - 12/17/22   Oxycodone HCl 10 MG TABS    Sig: Take 1.5 tablets (15 mg total) by mouth 3 (three) times daily as needed.    Dispense:  135 tablet  Refill:  0    Follow-up: Return in about 4 months (around 04/19/2023) for DM and chronic pain.    Anabel Halon, MD

## 2022-12-17 NOTE — Assessment & Plan Note (Signed)
BP Readings from Last 1 Encounters:  12/17/22 109/61   Well-controlled with atenolol and Cardizem Counseled for compliance with the medications Advised DASH diet and ambulate as tolerated

## 2022-12-17 NOTE — Patient Instructions (Signed)
Please start taking Lyrica 200 mg twice daily instead of 300 mg at bedtime.  Please continue taking Oxycodone as prescribed.  Please continue to take medications as prescribed.  Please continue to follow low carb diet and ambulate as tolerated.

## 2022-12-17 NOTE — Assessment & Plan Note (Signed)
Rate controlled with atenolol and Cardizem On Coumadin for [facility] Check INR Referred to cardiology

## 2022-12-18 ENCOUNTER — Telehealth: Payer: Self-pay | Admitting: Internal Medicine

## 2022-12-18 ENCOUNTER — Other Ambulatory Visit: Payer: Self-pay

## 2022-12-18 ENCOUNTER — Emergency Department (HOSPITAL_COMMUNITY)
Admission: EM | Admit: 2022-12-18 | Discharge: 2022-12-18 | Disposition: A | Discharge status: Home / Self Care | Payer: Medicare HMO

## 2022-12-18 ENCOUNTER — Encounter (HOSPITAL_COMMUNITY): Payer: Self-pay

## 2022-12-18 DIAGNOSIS — I5030 Unspecified diastolic (congestive) heart failure: Secondary | ICD-10-CM | POA: Insufficient documentation

## 2022-12-18 DIAGNOSIS — K922 Gastrointestinal hemorrhage, unspecified: Secondary | ICD-10-CM | POA: Insufficient documentation

## 2022-12-18 DIAGNOSIS — Z79899 Other long term (current) drug therapy: Secondary | ICD-10-CM | POA: Diagnosis not present

## 2022-12-18 DIAGNOSIS — Z7901 Long term (current) use of anticoagulants: Secondary | ICD-10-CM | POA: Diagnosis not present

## 2022-12-18 DIAGNOSIS — J449 Chronic obstructive pulmonary disease, unspecified: Secondary | ICD-10-CM | POA: Diagnosis not present

## 2022-12-18 DIAGNOSIS — D649 Anemia, unspecified: Secondary | ICD-10-CM | POA: Diagnosis present

## 2022-12-18 DIAGNOSIS — I110 Hypertensive heart disease with heart failure: Secondary | ICD-10-CM | POA: Insufficient documentation

## 2022-12-18 LAB — CBC
HCT: 25.3 % — ABNORMAL LOW (ref 39.0–52.0)
Hemoglobin: 6.3 g/dL — CL (ref 13.0–17.0)
MCH: 15.1 pg — ABNORMAL LOW (ref 26.0–34.0)
MCHC: 24.9 g/dL — ABNORMAL LOW (ref 30.0–36.0)
MCV: 60.8 fL — ABNORMAL LOW (ref 80.0–100.0)
Platelets: 257 10*3/uL (ref 150–400)
RBC: 4.16 MIL/uL — ABNORMAL LOW (ref 4.22–5.81)
RDW: 22 % — ABNORMAL HIGH (ref 11.5–15.5)
WBC: 7.5 10*3/uL (ref 4.0–10.5)
nRBC: 0.3 % — ABNORMAL HIGH (ref 0.0–0.2)

## 2022-12-18 LAB — CBC WITH DIFFERENTIAL/PLATELET
Basophils Absolute: 0.1 10*3/uL (ref 0.0–0.2)
Basos: 1 %
EOS (ABSOLUTE): 0.3 10*3/uL (ref 0.0–0.4)
Eos: 4 %
Hematocrit: 26.2 % — ABNORMAL LOW (ref 37.5–51.0)
Hemoglobin: 6.3 g/dL — CL (ref 13.0–17.7)
Immature Grans (Abs): 0 10*3/uL (ref 0.0–0.1)
Immature Granulocytes: 0 %
Lymphocytes Absolute: 1 10*3/uL (ref 0.7–3.1)
Lymphs: 14 %
MCH: 14.8 pg — ABNORMAL LOW (ref 26.6–33.0)
MCHC: 24 g/dL — CL (ref 31.5–35.7)
MCV: 62 fL — ABNORMAL LOW (ref 79–97)
Monocytes Absolute: 1.1 10*3/uL — ABNORMAL HIGH (ref 0.1–0.9)
Monocytes: 15 %
Neutrophils Absolute: 4.9 10*3/uL (ref 1.4–7.0)
Neutrophils: 66 %
Platelets: 261 10*3/uL (ref 150–450)
RBC: 4.26 x10E6/uL (ref 4.14–5.80)
RDW: 20.2 % — ABNORMAL HIGH (ref 11.6–15.4)
WBC: 7.4 10*3/uL (ref 3.4–10.8)

## 2022-12-18 LAB — PROTIME-INR
INR: 1.4 — ABNORMAL HIGH (ref 0.9–1.2)
INR: 2 — ABNORMAL HIGH (ref 0.8–1.2)
Prothrombin Time: 15.8 s — ABNORMAL HIGH (ref 9.1–12.0)
Prothrombin Time: 22.4 s — ABNORMAL HIGH (ref 11.4–15.2)

## 2022-12-18 LAB — BASIC METABOLIC PANEL
Anion gap: 8 (ref 5–15)
BUN: 21 mg/dL (ref 8–23)
CO2: 28 mmol/L (ref 22–32)
Calcium: 8.4 mg/dL — ABNORMAL LOW (ref 8.9–10.3)
Chloride: 100 mmol/L (ref 98–111)
Creatinine, Ser: 1.18 mg/dL (ref 0.61–1.24)
GFR, Estimated: >60 mL/min (ref >60)
Glucose, Bld: 209 mg/dL — ABNORMAL HIGH (ref 70–99)
Potassium: 3.9 mmol/L (ref 3.5–5.1)
Sodium: 136 mmol/L (ref 135–145)

## 2022-12-18 LAB — PREPARE RBC (CROSSMATCH)

## 2022-12-18 LAB — HEMOGLOBIN A1C
Est. average glucose Bld gHb Est-mCnc: 137 mg/dL
Hgb A1c MFr Bld: 6.4 % — ABNORMAL HIGH (ref 4.8–5.6)

## 2022-12-18 MED ORDER — SODIUM CHLORIDE 0.9% IV SOLUTION: Freq: Once | INTRAVENOUS | Status: AC

## 2022-12-18 MED ORDER — PANTOPRAZOLE SODIUM 20 MG PO TBEC: 20.0000 mg | DELAYED_RELEASE_TABLET | Freq: Every day | ORAL | 0 refills | Status: DC

## 2022-12-18 NOTE — ED Provider Notes (Signed)
This patient is a 82 year old male, has a history of being on warfarin, and was found to have hemoglobin of 6.3.  The patient was Hemoccult positive according to the prior provider, the patient had refused to be admitted to the hospital.  The patient reports to me that he refuses to be admitted to the hospital and just wants a blood transfusion.  I have made it very firm case that we usually admit this to the hospital because of the instability associated with being anticoagulated.  The patient again refuses to be admitted, he has his medical decision making capacity.  2 units given, he will follow-up outpatient   Eber Hong, MD 12/18/22 1709

## 2022-12-18 NOTE — ED Triage Notes (Signed)
F/u with PCP yesterday with routine blood draw and referred for hemoglobin level of 6.3.  Denies weakness. Son reports patient has been sleeping more than normal.  Pt is on coumadin  Pt denies recent bleeding or black tarry stools.

## 2022-12-18 NOTE — Discharge Instructions (Addendum)
Please follow-up with the gastroenterologist.  Transiently felt fevers, chills, lightheadedness, chest pain, shortness of breath abdominal pain.  You have bloody bowel movements or any new or worsening symptoms that are concerning to you.  We have recommended that you be admitted to the hospital but you are refusing, you may return at any time should he change her mind.

## 2022-12-18 NOTE — ED Provider Notes (Signed)
[place] EMERGENCY DEPARTMENT AT [facility] Provider Note   CSN: [id number] Arrival date & time: 12/18/22  1038     History  Chief Complaint  Patient presents with   abnormal labs    Wayne Middleton is a 82 y.o. male.  Presenting emergency department with anemia on outpatient labs.  He is history of A-fib on warfarin.  Denies any overt signs of bleeding.  States he feels his normal self, no lightheadedness dizziness chest pain, shortness of breath abdominal pain.        Home Medications Prior to Admission medications   Medication Sig Start Date End Date Taking? Authorizing Provider  allopurinol (ZYLOPRIM) 300 MG tablet Take 300 mg by mouth daily. 11/19/22  Yes [provider]  FLUoxetine (PROZAC) 20 MG capsule Take 40 mg by mouth every morning. 10/26/22  Yes [provider]  furosemide (LASIX) 20 MG tablet Take 20 mg by mouth daily as needed. 09/26/22  Yes [provider]  meclizine (ANTIVERT) 25 MG tablet Take 25 mg by mouth 3 (three) times daily as needed. 09/27/22  Yes [provider]  ondansetron (ZOFRAN) 4 MG tablet Take 1 tablet (4 mg total) by mouth every 8 (eight) hours as needed for nausea or vomiting. 10/18/22   Anabel Halon, MD  pantoprazole (PROTONIX) 20 MG tablet Take 1 tablet (20 mg total) by mouth daily. 12/18/22 01/17/23 Yes Donnie Panik, Harmon Dun, DO  atenolol (TENORMIN) 25 MG tablet Take 1 tablet (25 mg total) by mouth daily. 10/04/22   Anabel Halon, MD  cetirizine (ZYRTEC) 10 MG tablet Take 10 mg by mouth daily. 12/20/20   [provider]  diltiazem (CARDIZEM CD) 240 MG 24 hr capsule Take 1 capsule (240 mg total) by mouth daily. 02/15/21   Jonelle Sidle, MD  diphenoxylate-atropine (LOMOTIL) 2.5-0.025 MG tablet Take 1-2 tablets by mouth 4 (four) times daily as needed for diarrhea or loose stools. 02/20/22   [provider]  FLUoxetine (PROZAC) 40 MG capsule Take 1 capsule (40 mg total) by mouth daily.  09/16/22   Anabel Halon, MD  fluticasone (FLONASE) 50 MCG/ACT nasal spray Place 1 spray into both nostrils daily. 03/20/22   [provider]  furosemide (LASIX) 40 MG tablet Take 1 tablet (40 mg total) by mouth daily. 08/30/22 08/30/23  Anabel Halon, MD  nystatin-triamcinolone (MYCOLOG II) cream Apply topically 2 (two) times daily. 03/26/22   [provider]  Oxycodone HCl 10 MG TABS Take 1.5 tablets (15 mg total) by mouth 3 (three) times daily as needed. 12/17/22   Anabel Halon, MD  potassium chloride (MICRO-K) 10 MEQ CR capsule Take 1 capsule (10 mEq total) by mouth daily. 04/12/22 06/11/22  Shahmehdi, Gemma Payor, MD  pregabalin (LYRICA) 200 MG capsule Take 1 capsule (200 mg total) by mouth 2 (two) times daily. 12/17/22   Anabel Halon, MD  warfarin (COUMADIN) 5 MG tablet Take 1 tablet (5 mg total) by mouth daily. 04/20/22   Catarina Hartshorn, MD  zolpidem (AMBIEN) 10 MG tablet Take 1 tablet (10 mg total) by mouth at bedtime as needed for sleep. 10/14/22   Anabel Halon, MD      Allergies    Patient has no known allergies.    Review of Systems   Review of Systems  Physical Exam Updated Vital Signs BP 110/71 (BP Location: Left Arm)   Pulse 82   Temp 97.9 F (36.6 C) (Oral)   Resp 18  Ht 6\' 2"  (1.88 m)   Wt 73 kg   SpO2 94%   BMI 20.67 kg/m  Physical Exam Vitals and nursing note reviewed.  Constitutional:      General: He is not in acute distress.    Appearance: He is not toxic-appearing.  HENT:     Head: Normocephalic.     Nose: Nose normal.     Mouth/Throat:     Mouth: Mucous membranes are moist.  Eyes:     Conjunctiva/sclera: Conjunctivae normal.  Cardiovascular:     Rate and Rhythm: Normal rate and regular rhythm.  Pulmonary:     Effort: Pulmonary effort is normal.  Abdominal:     General: Abdomen is flat. There is no distension.     Tenderness: There is no abdominal tenderness. There is no guarding or rebound.  Skin:    General: Skin is warm and dry.   Neurological:     General: No focal deficit present.     Mental Status: He is alert.  Psychiatric:        Mood and Affect: Mood normal.        Behavior: Behavior normal.     ED Results / Procedures / Treatments   Labs (all labs ordered are listed, but only abnormal results are displayed) Labs Reviewed  BASIC METABOLIC PANEL - Abnormal; Notable for the following components:      Result Value   Glucose, Bld 209 (*)    Calcium 8.4 (*)    All other components within normal limits  CBC - Abnormal; Notable for the following components:   RBC 4.16 (*)    Hemoglobin 6.3 (*)    HCT 25.3 (*)    MCV 60.8 (*)    MCH 15.1 (*)    MCHC 24.9 (*)    RDW 22.0 (*)    nRBC 0.3 (*)    All other components within normal limits  PROTIME-INR - Abnormal; Notable for the following components:   Prothrombin Time 22.4 (*)    INR 2.0 (*)    All other components within normal limits  POC OCCULT BLOOD, ED  TYPE AND SCREEN  PREPARE RBC (CROSSMATCH)  PREPARE RBC (CROSSMATCH)    EKG None  Radiology No results found.  Procedures Procedures    Medications Ordered in ED Medications  0.9 %  sodium chloride infusion (Manually program via Guardrails IV Fluids) ( Intravenous New Bag/Given 12/18/22 1301)  0.9 %  sodium chloride infusion (Manually program via Guardrails IV Fluids) ( Intravenous New Bag/Given 12/18/22 1547)    ED Course/ Medical Decision Making/ A&P Clinical Course as of 12/18/22 1645  Wed Dec 18, 2022  1429 Per PCP visit yesterday: 82 y.o. male with past medical history of atrial fibrillation, HFpEF, HTN, COPD, peripheral neuropathy, lumbar spinal stenosis s/p lumbar surgeries, chronic pain syndrome, MDD and lymphedema [TY]  1449 Case discussed with on-call GI, Dr. Jena Gauss, recommending 2 units of blood given age and risk factors.  He thinks patient likely candidate for outpatient follow-up given stable vitals.  Will have staff reach out and arrange follow-up. [TY]    Clinical Course  User Index [TY] Coral Spikes, DO                                 Medical Decision Making Well-appearing male presents emergency department with reported hemoglobin 6.  Afebrile nontachycardic and stable.  Patient is asymptomatic from his anemia.  Per  chart review.  He has A-fib on warfarin.  His INR is not supratherapeutic.  His hemoglobin is at 6.3.  Hemoccult positive, but not grossly so.  Case discussed with GI, see ED course.  Patient receiving 2 units of blood.  Offered admission for observation.  Patient does not want to stay.  Discussed risks of leaving to include permanent disability or even death.  Patient understands risks and would like to leave.  Will discharge after transfusion.  Care signed out to Dr. Hyacinth Meeker afternoon team.   Amount and/or Complexity of Data Reviewed Labs: ordered.  Risk Prescription drug management.          Final Clinical Impression(s) / ED Diagnoses Final diagnoses:  Anemia requiring transfusions  Gastrointestinal hemorrhage, unspecified gastrointestinal hemorrhage type    Rx / DC Orders ED Discharge Orders          Ordered    pantoprazole (PROTONIX) 20 MG tablet  Daily        12/18/22 1559              Coral Spikes, DO 12/18/22 1645

## 2022-12-18 NOTE — Telephone Encounter (Signed)
Patient [name] calling returning Dr Eliane Decree call- says he spoke with his brother but his brother is not explaining things very well and he is needing some clarification. Please advise Thank you

## 2022-12-19 ENCOUNTER — Telehealth: Payer: Self-pay | Admitting: Internal Medicine

## 2022-12-19 DIAGNOSIS — D62 Acute posthemorrhagic anemia: Secondary | ICD-10-CM | POA: Insufficient documentation

## 2022-12-19 LAB — TYPE AND SCREEN
ABO/RH(D): O POS
Antibody Screen: NEGATIVE
Unit division: 0
Unit division: 0

## 2022-12-19 LAB — BPAM RBC
Blood Product Expiration Date: 202411092359
Blood Product Expiration Date: 202411092359
ISSUE DATE / TIME: 202410091252
ISSUE DATE / TIME: 202410091536
Unit Type and Rh: 5100
Unit Type and Rh: 5100

## 2022-12-19 LAB — MICROALBUMIN / CREATININE URINE RATIO
Creatinine, Urine: 71.5 mg/dL
Microalb/Creat Ratio: 21 mg/g{creat} (ref 0–29)
Microalbumin, Urine: 15.3 ug/mL

## 2022-12-19 NOTE — Telephone Encounter (Signed)
-----   Message from Eula Listen sent at 12/18/2022  3:27 PM EDT -----  Lorain Childes, this patient presented to the ED at the request of his PCP hemoglobin in the 6 range MCV 60 occult blood positive.  Had labs done today;  walking around with no symptoms on Coumadin for atrial fibrillation presented to the ED. ED doctor found him to be stable without symptoms otherwise.  ED plan was to transfuse 2 units of packed red blood cells and have him follow-up with [place] as an outpatient, holding Coumadin.  Lives [place]ally and is a reliable.  I recommended a PPI empirically.  He needs to be seen in the office sometime next week could we please make contact with him in the next 24 hours and get him in with an app.  Thanks.

## 2022-12-19 NOTE — Assessment & Plan Note (Signed)
On Coumadin due to A-fib Check INR and CBC

## 2022-12-19 NOTE — Assessment & Plan Note (Signed)
Addendum:  His Hb dropped to 6.3 from 10.2 in 4 months Called and advised to go to ER for PRBC transfusion and evaluation of acute blood loss anemia

## 2022-12-19 NOTE — Assessment & Plan Note (Signed)
S/p lumbar spine surgeries Has chronic low back pain with radicular symptoms On oxycodone and Lyrica currently

## 2022-12-19 NOTE — Assessment & Plan Note (Signed)
Uncontrolled, but improving Although his PHQ-9 score is high, his sons report that he has no crying spells now and does not appear depressed at home On Prozac 40 mg daily His chronic pain and lack of mobility are also contributing to MDD

## 2022-12-19 NOTE — Telephone Encounter (Signed)
Called the patient and left a message asking him to contact our office to get scheduled.

## 2023-01-13 ENCOUNTER — Telehealth: Payer: Self-pay | Admitting: Internal Medicine

## 2023-01-13 ENCOUNTER — Other Ambulatory Visit: Payer: Self-pay

## 2023-01-13 ENCOUNTER — Other Ambulatory Visit: Payer: Self-pay | Admitting: Internal Medicine

## 2023-01-13 DIAGNOSIS — M48062 Spinal stenosis, lumbar region with neurogenic claudication: Secondary | ICD-10-CM

## 2023-01-13 DIAGNOSIS — G894 Chronic pain syndrome: Secondary | ICD-10-CM

## 2023-01-13 MED ORDER — OXYCODONE HCL 10 MG PO TABS: 15.0000 mg | ORAL_TABLET | Freq: Three times a day (TID) | ORAL | 0 refills | Status: DC | PRN

## 2023-01-13 MED ORDER — ATENOLOL 25 MG PO TABS: 25.0000 mg | ORAL_TABLET | Freq: Every day | ORAL | 0 refills | Status: DC

## 2023-01-13 NOTE — Telephone Encounter (Signed)
Spoke to son

## 2023-01-13 NOTE — Telephone Encounter (Signed)
Prescription Request  01/13/2023  LOV: 12/17/2022  What is the name of the medication or equipment? Oxycodone HCl 10 MG TABS [[id number]]  atenolol (TENORMIN) 25 MG tablet [[id number]]    Have you contacted your pharmacy to request a refill? No   Which pharmacy would you like this sent to?  [facility] - [place], [place] - [address] [place] [address] Phone: [phone] Fax: [phone]    Patient notified that their request is being sent to the clinical staff for review and that they should receive a response within 2 business days.   Please advise at [facility] [phone]

## 2023-02-03 ENCOUNTER — Other Ambulatory Visit: Payer: Self-pay

## 2023-02-03 DIAGNOSIS — I5032 Chronic diastolic (congestive) heart failure: Secondary | ICD-10-CM

## 2023-02-03 MED ORDER — FUROSEMIDE 40 MG PO TABS: 40.0000 mg | ORAL_TABLET | Freq: Every day | ORAL | 0 refills | Status: DC

## 2023-02-10 ENCOUNTER — Other Ambulatory Visit: Payer: Self-pay | Admitting: Internal Medicine

## 2023-02-10 ENCOUNTER — Telehealth: Payer: Self-pay

## 2023-02-10 DIAGNOSIS — M48062 Spinal stenosis, lumbar region with neurogenic claudication: Secondary | ICD-10-CM

## 2023-02-10 DIAGNOSIS — G894 Chronic pain syndrome: Secondary | ICD-10-CM

## 2023-02-10 MED ORDER — OXYCODONE HCL 10 MG PO TABS: 15.0000 mg | ORAL_TABLET | Freq: Three times a day (TID) | ORAL | 0 refills | Status: DC | PRN

## 2023-02-10 NOTE — Telephone Encounter (Signed)
Copied from CRM [id number]. Topic: Clinical - Medication Refill >> Feb 10, 2023 12:39 PM Fonda Kinder J wrote: Most Recent Primary Care Visit:  Provider: Anabel Halon  Department: [facility]  Visit Type: OFFICE VISIT  Date: 12/17/2022  Medication: Oxycodone HCl 10 MG  Has the patient contacted their pharmacy? Yes (Agent: If no, request that the patient contact the pharmacy for the refill. If patient does not wish to contact the pharmacy document the reason why and proceed with request.) (Agent: If yes, when and what did the pharmacy advise?) Call provider  Is this the correct pharmacy for this prescription? Yes If no, delete pharmacy and type the correct one.  This is the patient's preferred pharmacy:  [facility] - [place], [place] - [address] [place] [address] Phone: [phone] Fax: [phone]   Has the prescription been filled recently? Yes  Is the patient out of the medication? Yes  Has the patient been seen for an appointment in the last year OR does the patient have an upcoming appointment? Yes  Can we respond through MyChart? No  Agent: Please be advised that Rx refills may take up to 3 business days. We ask that you follow-up with your pharmacy.

## 2023-02-10 NOTE — Telephone Encounter (Signed)
Son advised

## 2023-03-11 ENCOUNTER — Telehealth: Payer: Self-pay

## 2023-03-11 ENCOUNTER — Other Ambulatory Visit: Payer: Self-pay | Admitting: Internal Medicine

## 2023-03-11 DIAGNOSIS — M48062 Spinal stenosis, lumbar region with neurogenic claudication: Secondary | ICD-10-CM

## 2023-03-11 DIAGNOSIS — G894 Chronic pain syndrome: Secondary | ICD-10-CM

## 2023-03-11 MED ORDER — OXYCODONE HCL 10 MG PO TABS: 15.0000 mg | ORAL_TABLET | Freq: Three times a day (TID) | ORAL | 0 refills | Status: DC | PRN

## 2023-03-11 NOTE — Telephone Encounter (Signed)
 Copied from CRM [id number]. Topic: Clinical - Medication Refill >> Mar 11, 2023  1:04 PM Elle L wrote: Most Recent Primary Care Visit:  Provider: TOBIE SUZZANE POUR  Department: [facility]  Visit Type: OFFICE VISIT  Date: 12/17/2022  Medication: ***  Has the patient contacted their pharmacy?  (Agent: If no, request that the patient contact the pharmacy for the refill. If patient does not wish to contact the pharmacy document the reason why and proceed with request.) (Agent: If yes, when and what did the pharmacy advise?)  Is this the correct pharmacy for this prescription?  If no, delete pharmacy and type the correct one.  This is the patient's preferred pharmacy:  [facility] - [place], [place] - [address] [place] [address] Phone: [phone] Fax: [phone]   Has the prescription been filled recently?   Is the patient out of the medication?   Has the patient been seen for an appointment in the last year OR does the patient have an upcoming appointment?   Can we respond through MyChart?   Agent: Please be advised that Rx refills may take up to 3 business days. We ask that you follow-up with your pharmacy.

## 2023-03-11 NOTE — Telephone Encounter (Signed)
Patient advised.

## 2023-03-11 NOTE — Telephone Encounter (Signed)
 Copied from CRM [id number]. Topic: Clinical - Medication Refill >> Mar 11, 2023  1:04 PM Elle L wrote: Most Recent Primary Care Visit:  Provider: TOBIE SUZZANE POUR  Department: [facility]  Visit Type: OFFICE VISIT  Date: 12/17/2022  Medication: Oxycodone  HCl 10 MG TABS  Has the patient contacted their pharmacy? Yes  Is this the correct pharmacy for this prescription? Yes  This is the patient's preferred pharmacy:  [facility] - [place], [place] - [address] [place] [address] Phone: [phone] Fax: [phone]  Has the prescription been filled recently? Yes  Is the patient out of the medication? Yes  Has the patient been seen for an appointment in the last year OR does the patient have an upcoming appointment? Yes  Can we respond through MyChart? No  Agent: Please be advised that Rx refills may take up to 3 business days. We ask that you follow-up with your pharmacy.

## 2023-03-24 ENCOUNTER — Telehealth: Payer: Self-pay | Admitting: Internal Medicine

## 2023-03-24 ENCOUNTER — Other Ambulatory Visit: Payer: Self-pay | Admitting: Internal Medicine

## 2023-03-24 ENCOUNTER — Ambulatory Visit: Payer: Self-pay | Admitting: Internal Medicine

## 2023-03-24 DIAGNOSIS — G4701 Insomnia due to medical condition: Secondary | ICD-10-CM

## 2023-03-24 MED ORDER — ZOLPIDEM TARTRATE 10 MG PO TABS
10.0000 mg | ORAL_TABLET | Freq: Every evening | ORAL | 0 refills | Status: DC | PRN
Start: 2023-03-24 — End: 2023-05-06

## 2023-03-24 NOTE — Telephone Encounter (Signed)
 Attempt made to contact pt: no answer and unable to leave voicemail due to answering service: will attempt to call pt again.  Note: PCP office: [facility] [phone]

## 2023-03-24 NOTE — Telephone Encounter (Signed)
 Attempt made to call pt: see nurse triage note

## 2023-03-24 NOTE — Telephone Encounter (Signed)
 Copied from CRM [id number]. Topic: Clinical - Medication Refill >> Mar 24, 2023 10:28 AM Chase BROCKS wrote: Most Recent Primary Care Visit:  Provider: TOBIE SUZZANE POUR  Department: [facility]  Visit Type: OFFICE VISIT  Date: 12/17/2022  Medication: zolpidem  (AMBIEN ) 10 MG tablet   Has the patient contacted their pharmacy? Yes (Agent: If no, request that the patient contact the pharmacy for the refill. If patient does not wish to contact the pharmacy document the reason why and proceed with request.) (Agent: If yes, when and what did the pharmacy advise?)  Is this the correct pharmacy for this prescription? Yes If no, delete pharmacy and type the correct one.  This is the patient's preferred pharmacy:  [facility] - [place], [place] - [address] [place] [address] Phone: [phone] Fax: [phone]   Has the prescription been filled recently? Yes  Is the patient out of the medication? Yes  Has the patient been seen for an appointment in the last year OR does the patient have an upcoming appointment? Yes  Can we respond through MyChart? Yes  Agent: Please be advised that Rx refills may take up to 3 business days. We ask that you follow-up with your pharmacy.

## 2023-03-24 NOTE — Telephone Encounter (Signed)
 Patient's son called in stating they are at pharmacy trying to get an early refill on father's ambien . Patient's son states father dropped a few down the sink and has run out early. Patient's son states father hasn't slept in 3-4 days. Advised patient a HP message will be sent to PCP, but it could take up to 3 days. Advised patient to consult pharmacist about unisom and/or melatonin in the meantime.   Copied from CRM [id number]. Topic: Clinical - Prescription Issue >> Mar 24, 2023 12:51 PM Elle L wrote: Reason for CRM: The patient's son is at the pharmacy for zolpidem  (AMBIEN ) 10 MG tablet. I advised him that it can take up to 3 business days but is concerned as the patient is not sleeping.

## 2023-03-24 NOTE — Telephone Encounter (Signed)
 Copied from CRM [id number]. Topic: Clinical - Medication Refill >> Mar 24, 2023 10:25 AM Chase BROCKS wrote: Most Recent Primary Care Visit:  Provider: TOBIE SUZZANE POUR  Department: [facility]  Visit Type: OFFICE VISIT  Date: 12/17/2022  Medication: gabapintine does not know the milligrams   Has the patient contacted their pharmacy? Yes (Agent: If no, request that the patient contact the pharmacy for the refill. If patient does not wish to contact the pharmacy document the reason why and proceed with request.) (Agent: If yes, when and what did the pharmacy advise?)  Is this the correct pharmacy for this prescription? Yes If no, delete pharmacy and type the correct one.  This is the patient's preferred pharmacy:  [facility] - [place], [place] - [address] [place] [address] Phone: [phone] Fax: [phone]   Has the prescription been filled recently? Yes  Is the patient out of the medication? Yes  Has the patient been seen for an appointment in the last year OR does the patient have an upcoming appointment? Yes  Can we respond through MyChart? Yes  Agent: Please be advised that Rx refills may take up to 3 business days. We ask that you follow-up with your pharmacy.

## 2023-03-25 NOTE — Telephone Encounter (Signed)
 Pt has been informed.

## 2023-03-26 ENCOUNTER — Other Ambulatory Visit: Payer: Self-pay | Admitting: Internal Medicine

## 2023-03-26 DIAGNOSIS — J309 Allergic rhinitis, unspecified: Secondary | ICD-10-CM | POA: Insufficient documentation

## 2023-03-26 DIAGNOSIS — J3089 Other allergic rhinitis: Secondary | ICD-10-CM

## 2023-03-26 DIAGNOSIS — R42 Dizziness and giddiness: Secondary | ICD-10-CM | POA: Insufficient documentation

## 2023-03-26 MED ORDER — FLUTICASONE PROPIONATE 50 MCG/ACT NA SUSP
1.0000 | Freq: Every day | NASAL | 3 refills | Status: DC
Start: 2023-03-26 — End: 2023-06-24

## 2023-03-26 MED ORDER — MECLIZINE HCL 25 MG PO TABS: 25.0000 mg | ORAL_TABLET | Freq: Three times a day (TID) | ORAL | 0 refills | Status: DC | PRN

## 2023-04-02 ENCOUNTER — Telehealth: Payer: Self-pay | Admitting: Internal Medicine

## 2023-04-02 ENCOUNTER — Other Ambulatory Visit: Payer: Self-pay | Admitting: Internal Medicine

## 2023-04-02 DIAGNOSIS — M48062 Spinal stenosis, lumbar region with neurogenic claudication: Secondary | ICD-10-CM

## 2023-04-02 NOTE — Telephone Encounter (Signed)
Copied from CRM [id number]. Topic: Clinical - Prescription Issue >> Apr 02, 2023  9:13 AM Prudencio Pair wrote: Reason for CRM: Shawna Orleans, with [facility], called stating that they sent over a refill request to Dr. Allena Katz in regards to Pregablin 200 mg & has not received a response. She's trying to get an update. Please give her a call back. CB #: [phone].

## 2023-04-02 NOTE — Telephone Encounter (Signed)
Rx request was faxed on 04/01/23, will send fax again to [phone]

## 2023-04-08 ENCOUNTER — Other Ambulatory Visit: Payer: Self-pay | Admitting: Internal Medicine

## 2023-04-08 DIAGNOSIS — G894 Chronic pain syndrome: Secondary | ICD-10-CM

## 2023-04-08 DIAGNOSIS — M48062 Spinal stenosis, lumbar region with neurogenic claudication: Secondary | ICD-10-CM

## 2023-04-08 MED ORDER — OXYCODONE HCL 10 MG PO TABS: 15.0000 mg | ORAL_TABLET | Freq: Three times a day (TID) | ORAL | 0 refills | Status: DC | PRN

## 2023-04-08 NOTE — Telephone Encounter (Signed)
Copied from CRM [id number]. Topic: Clinical - Medication Refill >> Apr 08, 2023 12:09 PM Adelina Mings wrote: Most Recent Primary Care Visit:  Provider: Anabel Halon  Department: [facility]  Visit Type: OFFICE VISIT  Date: 12/17/2022  Medication: Oxycodone HCl 10 MG TABS   Has the patient contacted their pharmacy? Yes (Agent: If no, request that the patient contact the pharmacy for the refill. If patient does not wish to contact the pharmacy document the reason why and proceed with request.) (Agent: If yes, when and what did the pharmacy advise?)  Is this the correct pharmacy for this prescription? Yes If no, delete pharmacy and type the correct one.  This is the patient's preferred pharmacy:  [facility] - [place], [place] - [address] [place] [address] Phone: [phone] Fax: [phone]   Has the prescription been filled recently? Yes  Is the patient out of the medication? Yes  Has the patient been seen for an appointment in the last year OR does the patient have an upcoming appointment? Yes  Can we respond through MyChart? No  Agent: Please be advised that Rx refills may take up to 3 business days. We ask that you follow-up with your pharmacy.

## 2023-04-11 ENCOUNTER — Telehealth: Payer: Self-pay | Admitting: Internal Medicine

## 2023-04-11 NOTE — Telephone Encounter (Unsigned)
Copied from CRM [id number]. Topic: Appointments - Appointment Scheduling >> Apr 11, 2023  8:19 AM Jorje Guild R wrote: Patient son called in wanting to see if he can speak with Dr. Allena Katz about his father safety and being home alone. But wants to talk before fathers appointment that's scheduled in Febraury. Also, dont want father in the mix of conversation. Wants to see about home health care because father is a safety risk to do anything in the home alone, but is very strong headed and not listening. Want it to be from a Physician mouth so it will stick for father. Wants a call on his personal number [phone] and his son name is Eluzer Howdeshell, his brother name is Beverly Suriano as well. Brother number is  [phone].

## 2023-04-13 ENCOUNTER — Other Ambulatory Visit: Payer: Self-pay | Admitting: Internal Medicine

## 2023-04-13 DIAGNOSIS — M48062 Spinal stenosis, lumbar region with neurogenic claudication: Secondary | ICD-10-CM

## 2023-04-17 ENCOUNTER — Other Ambulatory Visit: Payer: Self-pay | Admitting: Internal Medicine

## 2023-04-17 DIAGNOSIS — R42 Dizziness and giddiness: Secondary | ICD-10-CM

## 2023-04-21 ENCOUNTER — Encounter: Payer: Self-pay | Admitting: Internal Medicine

## 2023-04-21 ENCOUNTER — Ambulatory Visit: Payer: Medicare HMO | Admitting: Internal Medicine

## 2023-04-21 VITALS — BP 122/60 | HR 113 | Ht 68.0 in | Wt 191.8 lb

## 2023-04-21 DIAGNOSIS — G609 Hereditary and idiopathic neuropathy, unspecified: Secondary | ICD-10-CM | POA: Diagnosis not present

## 2023-04-21 DIAGNOSIS — E1140 Type 2 diabetes mellitus with diabetic neuropathy, unspecified: Secondary | ICD-10-CM

## 2023-04-21 DIAGNOSIS — I10 Essential (primary) hypertension: Secondary | ICD-10-CM | POA: Diagnosis not present

## 2023-04-21 DIAGNOSIS — I4821 Permanent atrial fibrillation: Secondary | ICD-10-CM

## 2023-04-21 DIAGNOSIS — F3341 Major depressive disorder, recurrent, in partial remission: Secondary | ICD-10-CM

## 2023-04-21 DIAGNOSIS — D62 Acute posthemorrhagic anemia: Secondary | ICD-10-CM

## 2023-04-21 DIAGNOSIS — F1190 Opioid use, unspecified, uncomplicated: Secondary | ICD-10-CM

## 2023-04-21 DIAGNOSIS — K219 Gastro-esophageal reflux disease without esophagitis: Secondary | ICD-10-CM

## 2023-04-21 DIAGNOSIS — M48062 Spinal stenosis, lumbar region with neurogenic claudication: Secondary | ICD-10-CM

## 2023-04-21 DIAGNOSIS — Z23 Encounter for immunization: Secondary | ICD-10-CM | POA: Diagnosis not present

## 2023-04-21 DIAGNOSIS — I5032 Chronic diastolic (congestive) heart failure: Secondary | ICD-10-CM | POA: Diagnosis not present

## 2023-04-21 DIAGNOSIS — R269 Unspecified abnormalities of gait and mobility: Secondary | ICD-10-CM

## 2023-04-21 MED ORDER — ATENOLOL 25 MG PO TABS
25.0000 mg | ORAL_TABLET | Freq: Every day | ORAL | 3 refills | Status: DC
Start: 2023-04-21 — End: 2023-06-24

## 2023-04-21 MED ORDER — FUROSEMIDE 40 MG PO TABS
40.0000 mg | ORAL_TABLET | Freq: Every day | ORAL | 1 refills | Status: DC
Start: 2023-04-21 — End: 2023-05-08

## 2023-04-21 MED ORDER — DILTIAZEM HCL ER COATED BEADS 240 MG PO CP24
240.0000 mg | ORAL_CAPSULE | Freq: Every day | ORAL | 1 refills | Status: DC
Start: 2023-04-21 — End: 2023-06-24

## 2023-04-21 MED ORDER — FLUOXETINE HCL 40 MG PO CAPS
40.0000 mg | ORAL_CAPSULE | Freq: Every day | ORAL | 1 refills | Status: DC
Start: 2023-04-21 — End: 2023-06-24

## 2023-04-21 MED ORDER — PANTOPRAZOLE SODIUM 40 MG PO TBEC
40.0000 mg | DELAYED_RELEASE_TABLET | Freq: Every day | ORAL | 1 refills | Status: DC
Start: 2023-04-21 — End: 2023-06-24

## 2023-04-21 MED ORDER — ONDANSETRON HCL 4 MG PO TABS: 4.0000 mg | ORAL_TABLET | Freq: Three times a day (TID) | ORAL | 0 refills | Status: DC | PRN

## 2023-04-21 NOTE — Assessment & Plan Note (Addendum)
 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

## 2023-04-21 NOTE — Progress Notes (Signed)
 Established Patient Office Visit  Subjective:  Patient ID: Wayne Middleton, male    DOB: April 21, 1940  Age: 83 y.o. MRN: [id number]  CC:  Chief Complaint  Patient presents with   Depression   Diabetes   Pain Management    HPI Wayne Middleton is a 83 y.o. male with past medical history of atrial fibrillation, HFpEF, HTN, COPD, peripheral neuropathy, lumbar spinal stenosis s/p lumbar surgeries, chronic pain syndrome, MDD and lymphedema who presents for f/u of his chronic medical conditions.  Atrial fibrillation, HFpEF and HTN: He takes atenolol  25 mg daily and Cardizem  240 mg daily.  He takes Lasix  40 mg daily for leg swelling and HFpEF.  He has chronic leg swelling, but has improved since increasing dose of Lasix ..  He takes Coumadin  for A-fib, but has not seen cardiology since 11/22.  He was referred to cardiology, but did not respond to their call and did not follow up after the last visit. Denies any chest pain, dyspnea or palpitations currently.  Lumbar spinal stenosis and chronic pain syndrome: He takes oxycodone  15 mg 3 times daily and Lyrica  200 mg BID currently. He has severe, chronic low back pain, which radiates to bilateral LE.  He has limited ambulation due to severe low back pain.  He has chronic numbness of the hands, for which he takes Lyrica .  Of note, his son reports that he is less tired and sleepy since decreasing dose of oxycodone .  But he asks to increase the dose of oxycodone  back to 20 mg because he has worsening of back pain since decreasing dose to 15 mg. I had lengthy discussion again about potential need to decrease the dose of opioids or at least not increasing dose to avoid hypersomnolence/drowsiness.  MDD: He takes Prozac  20 mg BID currently despite sending a new Rx of 40 mg QD.  He lives alone.  He is independent with his ADLs mostly and his sons help him with groceries.  He is getting increasingly weak recently and his sons have to help him with ambulation at times. He  has insomnia at nighttime and takes Ambien  10 mg QD for it.  Acute blood loss anemia: He is Hb had dropped to 6.3 in the last visit from 10.2 within 4 months time.  He was referred to ER for evaluation, had 2 units PRBC transfusion, but did not get admitted for further evaluation.  He did not get GI follow-up as well.  Today, he and his sons report that he has been fatigued and increasing weak.  Denies any episode of melena or hematochezia.  He has also run out of pantoprazole .  Of note, he reports nausea and epigastric discomfort for the last 2 weeks.  He had diarrhea before that, which has resolved now.    Past Medical History:  Diagnosis Date   Acute metabolic encephalopathy 04/17/2022   Acute respiratory failure (HCC) 04/09/2022   Atrial fibrillation (HCC)    CAP (community acquired pneumonia) 04/09/2022   Cardiomyopathy (HCC)    Tachycardia mediated, LVEF improved to 50-55% as of 2012   Cholelithiasis    Chronic anticoagulation    Chronic anxiety    Diabetes (HCC)    Essential hypertension    History of GI bleed 2011   Hospitalized for lower GI bleed following polypectomy   Peripheral neuropathy 11/11/2018    Past Surgical History:  Procedure Laterality Date   BACK SURGERY  1971   post-trauma   COLONOSCOPY W/ POLYPECTOMY  2006, 2011  postprocedure bleed requiring hospital admission   TUMOR EXCISION  1960s   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    Family History  Problem Relation Age of Onset   Stroke Mother 51   Heart attack Father 55   Lung cancer Brother        3 of 5 brothers deceased due to Carcinoma of the lung    Social History   Socioeconomic History   Marital status: Widowed    Spouse name: Not on file   Number of children: Not on file   Years of education: Not on file   Highest education level: Not on file  Occupational History   Occupation: Retired from [employer]  Tobacco Use   Smoking status: Never   Smokeless tobacco: Never   Vaping Use   Vaping status: Never Used  Substance and Sexual Activity   Alcohol use: No    Alcohol/week: 0.0 standard drinks of alcohol   Drug use: No   Sexual activity: Not on file  Other Topics Concern   Not on file  Social History Narrative   Patient previously walked four miles a day   Social Drivers of Corporate investment banker Strain: Not on file  Food Insecurity: No Food Insecurity (04/10/2022)   Hunger Vital Sign    Worried About Running Out of Food in the Last Year: Never true    Ran Out of Food in the Last Year: Never true  Transportation Needs: No Transportation Needs (04/10/2022)   PRAPARE - Administrator, Civil Service (Medical): No    Lack of Transportation (Non-Medical): No  Physical Activity: Not on file  Stress: Not on file  Social Connections: Not on file  Intimate Partner Violence: Not At Risk (04/10/2022)   Humiliation, Afraid, Rape, and Kick questionnaire    Fear of Current or Ex-Partner: No    Emotionally Abused: No    Physically Abused: No    Sexually Abused: No    Outpatient Medications Prior to Visit  Medication Sig Dispense Refill   cetirizine (ZYRTEC) 10 MG tablet Take 10 mg by mouth daily.     diphenoxylate-atropine (LOMOTIL) 2.5-0.025 MG tablet Take 1-2 tablets by mouth 4 (four) times daily as needed for diarrhea or loose stools.     fluticasone  (FLONASE ) 50 MCG/ACT nasal spray Place 1 spray into both nostrils daily. 48 g 3   meclizine  (ANTIVERT ) 25 MG tablet TAKE 1 TABLET THREE TIMES DAILY AS NEEDED FOR DIZZINESS 90 tablet 2   Oxycodone  HCl 10 MG TABS Take 1.5 tablets (15 mg total) by mouth 3 (three) times daily as needed. 135 tablet 0   potassium chloride  (MICRO-K ) 10 MEQ CR capsule Take 1 capsule (10 mEq total) by mouth daily. 30 capsule 1   pregabalin  (LYRICA ) 200 MG capsule Take 1 capsule (200 mg total) by mouth 2 (two) times daily. 60 capsule 3   warfarin (COUMADIN ) 5 MG tablet Take 1 tablet (5 mg total) by mouth daily.      zolpidem  (AMBIEN ) 10 MG tablet Take 1 tablet (10 mg total) by mouth at bedtime as needed for sleep. 30 tablet 0   atenolol  (TENORMIN ) 25 MG tablet Take 1 tablet (25 mg total) by mouth daily. 90 tablet 0   diltiazem  (CARDIZEM  CD) 240 MG 24 hr capsule Take 1 capsule (240 mg total) by mouth daily. 90 capsule 0   FLUoxetine  (PROZAC ) 40 MG capsule Take 1 capsule (40 mg total) by mouth daily. (Patient taking differently:  Take 40 mg by mouth 2 (two) times daily.) 90 capsule 1   furosemide  (LASIX ) 40 MG tablet Take 1 tablet (40 mg total) by mouth daily. 90 tablet 0   pantoprazole  (PROTONIX ) 20 MG tablet Take 1 tablet (20 mg total) by mouth daily. 30 tablet 0   No facility-administered medications prior to visit.    No Known Allergies  ROS Review of Systems  Constitutional:  Positive for fatigue. Negative for chills and fever.  HENT:  Negative for congestion, postnasal drip, sinus pressure and sore throat.   Eyes:  Negative for pain and discharge.  Respiratory:  Negative for cough and shortness of breath.   Cardiovascular:  Positive for leg swelling. Negative for chest pain and palpitations.  Gastrointestinal:  Negative for diarrhea, nausea and vomiting.  Endocrine: Negative for polydipsia and polyuria.  Genitourinary:  Negative for dysuria and hematuria.  Musculoskeletal:  Positive for arthralgias and back pain. Negative for neck pain and neck stiffness.  Skin:  Positive for color change (Redness over bilateral legs, chronic). Negative for rash.  Neurological:  Positive for weakness. Negative for numbness and headaches.  Psychiatric/Behavioral:  Positive for sleep disturbance. Negative for agitation and behavioral problems. The patient is nervous/anxious.       Objective:    Physical Exam Vitals reviewed.  Constitutional:      General: He is not in acute distress.    Appearance: He is not diaphoretic.     Comments: In wheelchair  HENT:     Head: Normocephalic and atraumatic.     Nose:  Nose normal.     Mouth/Throat:     Mouth: Mucous membranes are moist.  Eyes:     General: No scleral icterus.    Extraocular Movements: Extraocular movements intact.  Cardiovascular:     Rate and Rhythm: Normal rate and regular rhythm.     Heart sounds: Normal heart sounds. No murmur heard. Pulmonary:     Breath sounds: Normal breath sounds. No wheezing or rales.  Musculoskeletal:     Cervical back: Neck supple. No tenderness.     Right lower leg: Edema (Trace) present.     Left lower leg: Edema (Trace) present.  Skin:    General: Skin is warm.     Findings: Rash (Erythema over bilateral legs up to knee, with excoriations) present.  Neurological:     General: No focal deficit present.     Mental Status: He is alert and oriented to person, place, and time.     Sensory: Sensory deficit (Bilateral feet and hands) present.     Motor: Weakness (B/l LE - 3/5) present.  Psychiatric:        Mood and Affect: Mood normal.        Behavior: Behavior normal.     BP 122/60 (BP Location: Left Arm)   Pulse (!) 113   Ht 5\' 8"  (1.727 m)   Wt 191 lb 12.8 oz (87 kg)   SpO2 96%   BMI 29.16 kg/m  Wt Readings from Last 3 Encounters:  04/21/23 191 lb 12.8 oz (87 kg)  12/18/22 161 lb (73 kg)  12/17/22 207 lb (93.9 kg)    Lab Results  Component Value Date   TSH 1.530 08/30/2022   Lab Results  Component Value Date   WBC 7.5 12/18/2022   HGB 6.3 (LL) 12/18/2022   HCT 25.3 (L) 12/18/2022   MCV 60.8 (L) 12/18/2022   PLT 257 12/18/2022   Lab Results  Component Value Date  NA 136 12/18/2022   K 3.9 12/18/2022   CO2 28 12/18/2022   GLUCOSE 209 (H) 12/18/2022   BUN 21 12/18/2022   CREATININE 1.18 12/18/2022   BILITOT 0.5 08/30/2022   ALKPHOS 148 (H) 08/30/2022   AST 15 08/30/2022   ALT 8 08/30/2022   PROT 6.6 08/30/2022   ALBUMIN 3.4 (L) 08/30/2022   CALCIUM 8.4 (L) 12/18/2022   ANIONGAP 8 12/18/2022   EGFR 79 08/30/2022   Lab Results  Component Value Date   CHOL 98 (L)  08/30/2022   Lab Results  Component Value Date   HDL 29 (L) 08/30/2022   Lab Results  Component Value Date   LDLCALC 51 08/30/2022   Lab Results  Component Value Date   TRIG 92 08/30/2022   Lab Results  Component Value Date   CHOLHDL 3.4 08/30/2022   Lab Results  Component Value Date   HGBA1C 6.4 (H) 12/17/2022      Assessment & Plan:   Problem List Items Addressed This Visit       Cardiovascular and Mediastinum   Essential hypertension, benign   BP Readings from Last 1 Encounters:  04/21/23 122/60   Well-controlled with atenolol  and Cardizem  Counseled for compliance with the medications Advised DASH diet and ambulate as tolerated      Relevant Medications   atenolol  (TENORMIN ) 25 MG tablet   diltiazem  (CARDIZEM  CD) 240 MG 24 hr capsule   furosemide  (LASIX ) 40 MG tablet   Chronic diastolic CHF (congestive heart failure) (HCC)   Has chronic lymphedema Leg swelling improved now with Lasix  to 40 mg daily Referred to cardiology-he was planned to get repeat echocardiogram, but did not follow up      Relevant Medications   atenolol  (TENORMIN ) 25 MG tablet   diltiazem  (CARDIZEM  CD) 240 MG 24 hr capsule   furosemide  (LASIX ) 40 MG tablet   Permanent atrial fibrillation (HCC)   Rate controlled with atenolol  and Cardizem  On Coumadin  for [facility] Referred to cardiology again, but did not follow up      Relevant Medications   atenolol  (TENORMIN ) 25 MG tablet   diltiazem  (CARDIZEM  CD) 240 MG 24 hr capsule   furosemide  (LASIX ) 40 MG tablet     Digestive   Gastroesophageal reflux disease   Restart pantoprazole  40 mg once daily His current epigastric discomfort and nausea can be due to gastritis and/or recent gastroenteritis Zofran  as needed for nausea      Relevant Medications   ondansetron  (ZOFRAN ) 4 MG tablet   pantoprazole  (PROTONIX ) 40 MG tablet     Endocrine   Type 2 diabetes mellitus with diabetic neuropathy, unspecified (HCC) - Primary   Lab Results   Component Value Date   HGBA1C 6.4 (H) 12/17/2022    New onset, borderline Associated with HTN and HLD Would avoid adding any new medicine for now considering his age and borderline DM Advised to follow diabetic diet F/u CMP and HbA1c Diabetic eye exam: Advised to follow up with Ophthalmology for diabetic eye exam  On Lyrica  for neuropathy and chronic pain      Relevant Orders   CMP14+EGFR   Hemoglobin A1c   UA/M w/rflx Culture, Routine     Nervous and Auditory   Peripheral neuropathy   Has chronic numbness of the bilateral hands, could be due to carpal tunnel syndrome and/or peripheral neuropathy Used to take Lyrica  300 mg twice daily Had to decrease dose of Lyrica  to avoid daytime drowsiness to 200 mg BID for now, he is  more alert, but continue to complain of numbness of hands and feet - had discussion about hesitance to increase dose due to previous confusion and drowsy spells      Relevant Medications   FLUoxetine  (PROZAC ) 40 MG capsule     Other   Recurrent major depressive disorder, in partial remission (HCC)   Uncontrolled, but improving Although his PHQ-9 score is high, his sons report that he has no crying spells now and does not appear depressed at home On Prozac  40 mg daily - needs to take 40 mg once daily instead of 20 mg BID His chronic pain and lack of mobility are also contributing to MDD      Relevant Medications   FLUoxetine  (PROZAC ) 40 MG capsule   Chronic, continuous use of opioids   Relevant Orders   ToxASSURE Select 13 (MW), Urine   Spinal stenosis of lumbar region with neurogenic claudication   S/p lumbar spine surgeries Has chronic low back pain with radicular symptoms On oxycodone  and Lyrica  currently - advised to avoid increasing dose for now      Relevant Medications   FLUoxetine  (PROZAC ) 40 MG capsule   ABLA (acute blood loss anemia)   His Hb dropped to 6.3 in the last visit  from 10.2 in 4 months Had advised to go to ER for PRBC  transfusion and evaluation of acute blood loss anemia - but he left ER after getting PRBC transfusion, did not follow up with GI or Hematology INR was in therapeutic range Check CBC today      Relevant Orders   CBC with Differential/Platelet   Fe+TIBC+Fer   Gait disturbance   Likely due to lumbar radiculopathy Had referred to home PT, but he declined Multiple other medical conditions also contributing to gait disturbance including but not limited to his CHF, A-fib, anemia - but he has poor follow up with specialists      Other Visit Diagnoses       Encounter for immunization       Relevant Orders   Flu Vaccine Trivalent High Dose (Fluad) (Completed)         Meds ordered this encounter  Medications   atenolol  (TENORMIN ) 25 MG tablet    Sig: Take 1 tablet (25 mg total) by mouth daily.    Dispense:  90 tablet    Refill:  3   diltiazem  (CARDIZEM  CD) 240 MG 24 hr capsule    Sig: Take 1 capsule (240 mg total) by mouth daily.    Dispense:  90 capsule    Refill:  1   furosemide  (LASIX ) 40 MG tablet    Sig: Take 1 tablet (40 mg total) by mouth daily.    Dispense:  90 tablet    Refill:  1   FLUoxetine  (PROZAC ) 40 MG capsule    Sig: Take 1 capsule (40 mg total) by mouth daily.    Dispense:  90 capsule    Refill:  1   ondansetron  (ZOFRAN ) 4 MG tablet    Sig: Take 1 tablet (4 mg total) by mouth every 8 (eight) hours as needed for nausea or vomiting.    Dispense:  20 tablet    Refill:  0   pantoprazole  (PROTONIX ) 40 MG tablet    Sig: Take 1 tablet (40 mg total) by mouth daily.    Dispense:  90 tablet    Refill:  1    Follow-up: Return in about 3 months (around 07/19/2023).    Meldon Sport, MD

## 2023-04-21 NOTE — Assessment & Plan Note (Signed)
 Uncontrolled, but improving Although his PHQ-9 score is high, his sons report that he has no crying spells now and does not appear depressed at home On Prozac  40 mg daily - needs to take 40 mg once daily instead of 20 mg BID His chronic pain and lack of mobility are also contributing to MDD

## 2023-04-21 NOTE — Assessment & Plan Note (Signed)
 Restart pantoprazole  40 mg once daily His current epigastric discomfort and nausea can be due to gastritis and/or recent gastroenteritis Zofran  as needed for nausea

## 2023-04-21 NOTE — Patient Instructions (Addendum)
 Please start taking Pantoprazole  for acid reflux. Please take Zofran  as needed for nausea.  Please continue to take medications as prescribed.  Please continue to follow low carb diet and ambulate as tolerated.

## 2023-04-21 NOTE — Assessment & Plan Note (Signed)
 BP Readings from Last 1 Encounters:  04/21/23 122/60   Well-controlled with atenolol  and Cardizem  Counseled for compliance with the medications Advised DASH diet and ambulate as tolerated

## 2023-04-21 NOTE — Assessment & Plan Note (Signed)
 Rate controlled with atenolol  and Cardizem  On Coumadin  for [facility] Referred to cardiology again, but did not follow up

## 2023-04-21 NOTE — Assessment & Plan Note (Signed)
 Likely due to lumbar radiculopathy Had referred to home PT, but he declined Multiple other medical conditions also contributing to gait disturbance including but not limited to his CHF, A-fib, anemia - but he has poor follow up with specialists

## 2023-04-21 NOTE — Assessment & Plan Note (Signed)
 S/p lumbar spine surgeries Has chronic low back pain with radicular symptoms On oxycodone  and Lyrica  currently - advised to avoid increasing dose for now

## 2023-04-21 NOTE — Assessment & Plan Note (Signed)
 Has chronic lymphedema Leg swelling improved now with Lasix  to 40 mg daily Referred to cardiology-he was planned to get repeat echocardiogram, but did not follow up

## 2023-04-21 NOTE — Assessment & Plan Note (Signed)
 Lab Results  Component Value Date   HGBA1C 6.4 (H) 12/17/2022    New onset, borderline Associated with HTN and HLD Would avoid adding any new medicine for now considering his age and borderline DM Advised to follow diabetic diet F/u CMP and HbA1c Diabetic eye exam: Advised to follow up with Ophthalmology for diabetic eye exam  On Lyrica  for neuropathy and chronic pain

## 2023-04-21 NOTE — Assessment & Plan Note (Addendum)
 His Hb dropped to 6.3 in the last visit  from 10.2 in 4 months Had advised to go to ER for PRBC transfusion and evaluation of acute blood loss anemia - but he left ER after getting PRBC transfusion, did not follow up with GI or Hematology INR was in therapeutic range Check CBC today

## 2023-04-22 ENCOUNTER — Emergency Department (HOSPITAL_COMMUNITY): Payer: Medicare HMO

## 2023-04-22 ENCOUNTER — Other Ambulatory Visit: Payer: Self-pay

## 2023-04-22 ENCOUNTER — Encounter (HOSPITAL_COMMUNITY): Payer: Self-pay

## 2023-04-22 ENCOUNTER — Inpatient Hospital Stay (HOSPITAL_COMMUNITY)
Admission: EM | Admit: 2023-04-22 | Discharge: 2023-04-28 | DRG: 375 | Disposition: A | Discharge status: Home Health | Payer: Medicare HMO | Attending: Internal Medicine | Admitting: Internal Medicine

## 2023-04-22 ENCOUNTER — Telehealth: Payer: Self-pay | Admitting: Internal Medicine

## 2023-04-22 DIAGNOSIS — Z8249 Family history of ischemic heart disease and other diseases of the circulatory system: Secondary | ICD-10-CM

## 2023-04-22 DIAGNOSIS — Z7901 Long term (current) use of anticoagulants: Secondary | ICD-10-CM

## 2023-04-22 DIAGNOSIS — D122 Benign neoplasm of ascending colon: Secondary | ICD-10-CM | POA: Diagnosis not present

## 2023-04-22 DIAGNOSIS — Z8500 Personal history of malignant neoplasm of unspecified digestive organ: Secondary | ICD-10-CM

## 2023-04-22 DIAGNOSIS — Z79899 Other long term (current) drug therapy: Secondary | ICD-10-CM

## 2023-04-22 DIAGNOSIS — F419 Anxiety disorder, unspecified: Secondary | ICD-10-CM | POA: Diagnosis present

## 2023-04-22 DIAGNOSIS — I110 Hypertensive heart disease with heart failure: Secondary | ICD-10-CM | POA: Diagnosis present

## 2023-04-22 DIAGNOSIS — Z860100 Personal history of colon polyps, unspecified: Secondary | ICD-10-CM | POA: Diagnosis not present

## 2023-04-22 DIAGNOSIS — C183 Malignant neoplasm of hepatic flexure: Principal | ICD-10-CM | POA: Diagnosis present

## 2023-04-22 DIAGNOSIS — F0393 Unspecified dementia, unspecified severity, with mood disturbance: Secondary | ICD-10-CM | POA: Diagnosis present

## 2023-04-22 DIAGNOSIS — R4182 Altered mental status, unspecified: Secondary | ICD-10-CM | POA: Diagnosis not present

## 2023-04-22 DIAGNOSIS — I10 Essential (primary) hypertension: Secondary | ICD-10-CM | POA: Diagnosis not present

## 2023-04-22 DIAGNOSIS — K5669 Other partial intestinal obstruction: Secondary | ICD-10-CM | POA: Diagnosis not present

## 2023-04-22 DIAGNOSIS — R630 Anorexia: Secondary | ICD-10-CM | POA: Diagnosis present

## 2023-04-22 DIAGNOSIS — R195 Other fecal abnormalities: Secondary | ICD-10-CM | POA: Diagnosis not present

## 2023-04-22 DIAGNOSIS — F32A Depression, unspecified: Secondary | ICD-10-CM | POA: Diagnosis present

## 2023-04-22 DIAGNOSIS — Z7984 Long term (current) use of oral hypoglycemic drugs: Secondary | ICD-10-CM

## 2023-04-22 DIAGNOSIS — K648 Other hemorrhoids: Secondary | ICD-10-CM | POA: Diagnosis not present

## 2023-04-22 DIAGNOSIS — K8020 Calculus of gallbladder without cholecystitis without obstruction: Secondary | ICD-10-CM | POA: Diagnosis not present

## 2023-04-22 DIAGNOSIS — K2950 Unspecified chronic gastritis without bleeding: Secondary | ICD-10-CM | POA: Diagnosis not present

## 2023-04-22 DIAGNOSIS — D500 Iron deficiency anemia secondary to blood loss (chronic): Secondary | ICD-10-CM | POA: Diagnosis not present

## 2023-04-22 DIAGNOSIS — F05 Delirium due to known physiological condition: Secondary | ICD-10-CM | POA: Diagnosis present

## 2023-04-22 DIAGNOSIS — Z1152 Encounter for screening for COVID-19: Secondary | ICD-10-CM

## 2023-04-22 DIAGNOSIS — I5032 Chronic diastolic (congestive) heart failure: Secondary | ICD-10-CM | POA: Diagnosis present

## 2023-04-22 DIAGNOSIS — K2951 Unspecified chronic gastritis with bleeding: Secondary | ICD-10-CM | POA: Diagnosis not present

## 2023-04-22 DIAGNOSIS — G8929 Other chronic pain: Secondary | ICD-10-CM | POA: Diagnosis present

## 2023-04-22 DIAGNOSIS — M48062 Spinal stenosis, lumbar region with neurogenic claudication: Secondary | ICD-10-CM

## 2023-04-22 DIAGNOSIS — C7801 Secondary malignant neoplasm of right lung: Secondary | ICD-10-CM | POA: Diagnosis present

## 2023-04-22 DIAGNOSIS — C7802 Secondary malignant neoplasm of left lung: Secondary | ICD-10-CM | POA: Diagnosis not present

## 2023-04-22 DIAGNOSIS — K6389 Other specified diseases of intestine: Secondary | ICD-10-CM | POA: Diagnosis not present

## 2023-04-22 DIAGNOSIS — D62 Acute posthemorrhagic anemia: Secondary | ICD-10-CM | POA: Diagnosis not present

## 2023-04-22 DIAGNOSIS — K2990 Gastroduodenitis, unspecified, without bleeding: Secondary | ICD-10-CM | POA: Diagnosis not present

## 2023-04-22 DIAGNOSIS — E1142 Type 2 diabetes mellitus with diabetic polyneuropathy: Secondary | ICD-10-CM | POA: Diagnosis present

## 2023-04-22 DIAGNOSIS — K317 Polyp of stomach and duodenum: Secondary | ICD-10-CM | POA: Diagnosis not present

## 2023-04-22 DIAGNOSIS — Z823 Family history of stroke: Secondary | ICD-10-CM

## 2023-04-22 DIAGNOSIS — Z801 Family history of malignant neoplasm of trachea, bronchus and lung: Secondary | ICD-10-CM

## 2023-04-22 DIAGNOSIS — R5383 Other fatigue: Secondary | ICD-10-CM | POA: Diagnosis present

## 2023-04-22 DIAGNOSIS — I429 Cardiomyopathy, unspecified: Secondary | ICD-10-CM | POA: Diagnosis present

## 2023-04-22 DIAGNOSIS — D123 Benign neoplasm of transverse colon: Secondary | ICD-10-CM | POA: Diagnosis present

## 2023-04-22 DIAGNOSIS — K2970 Gastritis, unspecified, without bleeding: Secondary | ICD-10-CM | POA: Diagnosis not present

## 2023-04-22 DIAGNOSIS — C787 Secondary malignant neoplasm of liver and intrahepatic bile duct: Secondary | ICD-10-CM | POA: Diagnosis not present

## 2023-04-22 DIAGNOSIS — I700 Atherosclerosis of aorta: Secondary | ICD-10-CM | POA: Diagnosis not present

## 2023-04-22 DIAGNOSIS — D6832 Hemorrhagic disorder due to extrinsic circulating anticoagulants: Secondary | ICD-10-CM | POA: Diagnosis present

## 2023-04-22 DIAGNOSIS — K3189 Other diseases of stomach and duodenum: Secondary | ICD-10-CM | POA: Diagnosis not present

## 2023-04-22 DIAGNOSIS — G894 Chronic pain syndrome: Secondary | ICD-10-CM

## 2023-04-22 DIAGNOSIS — I6782 Cerebral ischemia: Secondary | ICD-10-CM | POA: Diagnosis not present

## 2023-04-22 DIAGNOSIS — K922 Gastrointestinal hemorrhage, unspecified: Secondary | ICD-10-CM | POA: Diagnosis not present

## 2023-04-22 DIAGNOSIS — T45515A Adverse effect of anticoagulants, initial encounter: Secondary | ICD-10-CM | POA: Diagnosis present

## 2023-04-22 DIAGNOSIS — D120 Benign neoplasm of cecum: Secondary | ICD-10-CM | POA: Diagnosis not present

## 2023-04-22 DIAGNOSIS — D509 Iron deficiency anemia, unspecified: Secondary | ICD-10-CM | POA: Diagnosis present

## 2023-04-22 DIAGNOSIS — Z602 Problems related to living alone: Secondary | ICD-10-CM | POA: Diagnosis present

## 2023-04-22 DIAGNOSIS — I482 Chronic atrial fibrillation, unspecified: Secondary | ICD-10-CM | POA: Diagnosis present

## 2023-04-22 DIAGNOSIS — I4891 Unspecified atrial fibrillation: Secondary | ICD-10-CM | POA: Diagnosis not present

## 2023-04-22 DIAGNOSIS — K921 Melena: Principal | ICD-10-CM | POA: Diagnosis present

## 2023-04-22 DIAGNOSIS — C189 Malignant neoplasm of colon, unspecified: Secondary | ICD-10-CM | POA: Diagnosis not present

## 2023-04-22 DIAGNOSIS — K769 Liver disease, unspecified: Secondary | ICD-10-CM | POA: Diagnosis not present

## 2023-04-22 DIAGNOSIS — K5730 Diverticulosis of large intestine without perforation or abscess without bleeding: Secondary | ICD-10-CM | POA: Diagnosis not present

## 2023-04-22 DIAGNOSIS — K635 Polyp of colon: Secondary | ICD-10-CM | POA: Diagnosis not present

## 2023-04-22 LAB — CBC WITH DIFFERENTIAL/PLATELET
Abs Immature Granulocytes: 0.01 10*3/uL (ref 0.00–0.07)
Basophils Absolute: 0.1 10*3/uL (ref 0.0–0.1)
Basophils Relative: 1 %
Eosinophils Absolute: 0 10*3/uL (ref 0.0–0.5)
Eosinophils Relative: 1 %
HCT: 23.3 % — ABNORMAL LOW (ref 39.0–52.0)
Hemoglobin: 5.9 g/dL — CL (ref 13.0–17.0)
Immature Granulocytes: 0 %
Lymphocytes Relative: 10 %
Lymphs Abs: 0.6 10*3/uL — ABNORMAL LOW (ref 0.7–4.0)
MCH: 14.3 pg — ABNORMAL LOW (ref 26.0–34.0)
MCHC: 25.3 g/dL — ABNORMAL LOW (ref 30.0–36.0)
MCV: 56.6 fL — ABNORMAL LOW (ref 80.0–100.0)
Monocytes Absolute: 0.7 10*3/uL (ref 0.1–1.0)
Monocytes Relative: 13 %
Neutro Abs: 4.1 10*3/uL (ref 1.7–7.7)
Neutrophils Relative %: 75 %
Platelets: 219 10*3/uL (ref 150–400)
RBC: 4.12 MIL/uL — ABNORMAL LOW (ref 4.22–5.81)
RDW: 21.8 % — ABNORMAL HIGH (ref 11.5–15.5)
Smear Review: ADEQUATE
WBC: 5.5 10*3/uL (ref 4.0–10.5)
nRBC: 0.4 % — ABNORMAL HIGH (ref 0.0–0.2)

## 2023-04-22 LAB — COMPREHENSIVE METABOLIC PANEL
ALT: 14 U/L (ref 0–44)
AST: 16 U/L (ref 15–41)
Albumin: 3.2 g/dL — ABNORMAL LOW (ref 3.5–5.0)
Alkaline Phosphatase: 75 U/L (ref 38–126)
Anion gap: 8 (ref 5–15)
BUN: 19 mg/dL (ref 8–23)
CO2: 27 mmol/L (ref 22–32)
Calcium: 8.7 mg/dL — ABNORMAL LOW (ref 8.9–10.3)
Chloride: 100 mmol/L (ref 98–111)
Creatinine, Ser: 1.02 mg/dL (ref 0.61–1.24)
GFR, Estimated: >60 mL/min (ref >60)
Glucose, Bld: 135 mg/dL — ABNORMAL HIGH (ref 70–99)
Potassium: 3.8 mmol/L (ref 3.5–5.1)
Sodium: 135 mmol/L (ref 135–145)
Total Bilirubin: 0.8 mg/dL (ref 0.0–1.2)
Total Protein: 6.9 g/dL (ref 6.5–8.1)

## 2023-04-22 LAB — URINALYSIS, ROUTINE W REFLEX MICROSCOPIC
Bilirubin Urine: NEGATIVE
Glucose, UA: NEGATIVE mg/dL
Hgb urine dipstick: NEGATIVE
Ketones, ur: NEGATIVE mg/dL
Leukocytes,Ua: NEGATIVE
Nitrite: NEGATIVE
Protein, ur: NEGATIVE mg/dL
Specific Gravity, Urine: 1.009 (ref 1.005–1.030)
pH: 5 (ref 5.0–8.0)

## 2023-04-22 LAB — POC OCCULT BLOOD, ED: Fecal Occult Bld: POSITIVE — AB

## 2023-04-22 LAB — PROTIME-INR
INR: 2.9 — ABNORMAL HIGH (ref 0.8–1.2)
Prothrombin Time: 30.4 s — ABNORMAL HIGH (ref 11.4–15.2)

## 2023-04-22 LAB — PREPARE RBC (CROSSMATCH)

## 2023-04-22 MED ORDER — ACETAMINOPHEN 650 MG RE SUPP: 650.0000 mg | Freq: Four times a day (QID) | RECTAL | Status: DC | PRN

## 2023-04-22 MED ORDER — ONDANSETRON HCL 4 MG/2ML IJ SOLN
4.0000 mg | Freq: Four times a day (QID) | INTRAMUSCULAR | Status: DC | PRN
Start: 2023-04-22 — End: 2023-04-29
  Administered 2023-04-23 – 2023-04-26 (×4): 4 mg via INTRAVENOUS
  Filled 2023-04-22 (×4): qty 2

## 2023-04-22 MED ORDER — ZOLPIDEM TARTRATE 5 MG PO TABS
10.0000 mg | ORAL_TABLET | Freq: Every evening | ORAL | Status: DC | PRN
  Administered 2023-04-23 – 2023-04-27 (×3): 10 mg via ORAL
  Filled 2023-04-22 (×3): qty 2

## 2023-04-22 MED ORDER — IRON SUCROSE 300 MG IVPB - SIMPLE MED
300.0000 mg | Freq: Every day | Status: AC
  Administered 2023-04-22 – 2023-04-24 (×3): 300 mg via INTRAVENOUS
  Filled 2023-04-22 (×2): qty 265
  Filled 2023-04-22: qty 300

## 2023-04-22 MED ORDER — PHYTONADIONE 5 MG PO TABS
10.0000 mg | ORAL_TABLET | Freq: Once | ORAL | Status: AC
  Administered 2023-04-22: 10 mg via ORAL
  Filled 2023-04-22: qty 2

## 2023-04-22 MED ORDER — QUETIAPINE FUMARATE 25 MG PO TABS
25.0000 mg | ORAL_TABLET | Freq: Every evening | ORAL | Status: DC | PRN
Start: 2023-04-22 — End: 2023-04-29

## 2023-04-22 MED ORDER — PREGABALIN 75 MG PO CAPS
200.0000 mg | ORAL_CAPSULE | Freq: Two times a day (BID) | ORAL | Status: DC
  Administered 2023-04-23 – 2023-04-28 (×11): 200 mg via ORAL
  Filled 2023-04-22 (×11): qty 1

## 2023-04-22 MED ORDER — PREGABALIN 75 MG PO CAPS
200.0000 mg | ORAL_CAPSULE | Freq: Once | ORAL | Status: AC
  Administered 2023-04-22: 200 mg via ORAL
  Filled 2023-04-22: qty 1

## 2023-04-22 MED ORDER — ACETAMINOPHEN 325 MG PO TABS
650.0000 mg | ORAL_TABLET | Freq: Four times a day (QID) | ORAL | Status: DC | PRN
  Administered 2023-04-25: 650 mg via ORAL
  Filled 2023-04-22: qty 2

## 2023-04-22 MED ORDER — FLUOXETINE HCL 20 MG PO CAPS
40.0000 mg | ORAL_CAPSULE | Freq: Every day | ORAL | Status: DC
  Administered 2023-04-22 – 2023-04-28 (×7): 40 mg via ORAL
  Filled 2023-04-22 (×7): qty 2

## 2023-04-22 MED ORDER — PHYTONADIONE 5 MG PO TABS: 5.0000 mg | ORAL_TABLET | Freq: Once | ORAL | Status: DC

## 2023-04-22 MED ORDER — SODIUM CHLORIDE 0.9 % IV SOLN: 250.0000 mL | INTRAVENOUS | Status: AC | PRN

## 2023-04-22 MED ORDER — ONDANSETRON HCL 4 MG PO TABS
4.0000 mg | ORAL_TABLET | Freq: Four times a day (QID) | ORAL | Status: DC | PRN
  Administered 2023-04-28: 4 mg via ORAL
  Filled 2023-04-22: qty 1

## 2023-04-22 MED ORDER — DILTIAZEM HCL ER COATED BEADS 120 MG PO CP24
240.0000 mg | ORAL_CAPSULE | Freq: Every day | ORAL | Status: DC
Start: 2023-04-22 — End: 2023-04-29
  Administered 2023-04-22 – 2023-04-28 (×6): 240 mg via ORAL
  Filled 2023-04-22 (×6): qty 2
  Filled 2023-04-22: qty 1

## 2023-04-22 MED ORDER — PANTOPRAZOLE SODIUM 40 MG IV SOLR
40.0000 mg | Freq: Once | INTRAVENOUS | Status: AC
  Administered 2023-04-22: 40 mg via INTRAVENOUS
  Filled 2023-04-22: qty 10

## 2023-04-22 MED ORDER — PANTOPRAZOLE SODIUM 40 MG IV SOLR
40.0000 mg | Freq: Two times a day (BID) | INTRAVENOUS | Status: DC
  Administered 2023-04-22 – 2023-04-27 (×10): 40 mg via INTRAVENOUS
  Filled 2023-04-22 (×10): qty 10

## 2023-04-22 MED ORDER — HALOPERIDOL LACTATE 5 MG/ML IJ SOLN
2.0000 mg | Freq: Four times a day (QID) | INTRAMUSCULAR | Status: DC | PRN
Start: 2023-04-22 — End: 2023-04-29

## 2023-04-22 MED ORDER — ATENOLOL 25 MG PO TABS
25.0000 mg | ORAL_TABLET | Freq: Every day | ORAL | Status: DC
  Administered 2023-04-22 – 2023-04-28 (×6): 25 mg via ORAL
  Filled 2023-04-22 (×7): qty 1

## 2023-04-22 MED ORDER — SODIUM CHLORIDE 0.9% FLUSH: 3.0000 mL | INTRAVENOUS | Status: DC | PRN

## 2023-04-22 MED ORDER — HALOPERIDOL 0.5 MG PO TABS
2.0000 mg | ORAL_TABLET | Freq: Once | ORAL | Status: AC
  Administered 2023-04-22: 2 mg via ORAL
  Filled 2023-04-22: qty 4

## 2023-04-22 MED ORDER — PHYTONADIONE 5 MG PO TABS: 2.5000 mg | ORAL_TABLET | Freq: Once | ORAL | Status: DC

## 2023-04-22 MED ORDER — SODIUM CHLORIDE 0.9% FLUSH
3.0000 mL | Freq: Two times a day (BID) | INTRAVENOUS | Status: DC
  Administered 2023-04-22 – 2023-04-28 (×12): 3 mL via INTRAVENOUS

## 2023-04-22 MED ORDER — OXYCODONE HCL 5 MG PO TABS
15.0000 mg | ORAL_TABLET | Freq: Three times a day (TID) | ORAL | Status: DC | PRN
  Administered 2023-04-22 – 2023-04-24 (×3): 15 mg via ORAL
  Filled 2023-04-22 (×3): qty 3

## 2023-04-22 MED ORDER — INSULIN ASPART 100 UNIT/ML IJ SOLN
0.0000 [IU] | Freq: Three times a day (TID) | INTRAMUSCULAR | Status: DC
  Administered 2023-04-24 – 2023-04-27 (×6): 2 [IU] via SUBCUTANEOUS
  Administered 2023-04-28: 8 [IU] via SUBCUTANEOUS

## 2023-04-22 MED ORDER — SODIUM CHLORIDE 0.9% IV SOLUTION: Freq: Once | INTRAVENOUS | Status: AC

## 2023-04-22 NOTE — ED Notes (Signed)
Son is at bedside for consent of pt to receive blood products as PA-C deemed pt is not cognitively able to consent to blood products as pt is confused at baseline

## 2023-04-22 NOTE — ED Notes (Signed)
Pt is not in the right states of mind to consent to receiving blood products, PA-C made aware as well. Pt does not have a legal guardian listed in medical chart

## 2023-04-22 NOTE — ED Triage Notes (Signed)
Pt arrived via POV for treatment of abnormal labs. Pt reports Hgb is very low and he was advised to seek treatment in the ER.

## 2023-04-22 NOTE — ED Notes (Signed)
Pt given ice chips

## 2023-04-22 NOTE — ED Notes (Signed)
POC occult blood is positive--PA-C aware

## 2023-04-22 NOTE — ED Notes (Signed)
Per PA-C, pt may have ice chips

## 2023-04-22 NOTE — ED Notes (Addendum)
PA-C called son pertaining to blood administration and son is on his way back to ED for blood consent

## 2023-04-22 NOTE — ED Provider Notes (Cosign Needed Addendum)
[place] EMERGENCY DEPARTMENT AT [facility] Provider Note   CSN: [id number] Arrival date & time: 04/22/23  1149     History  Chief Complaint  Patient presents with   Fatigue    Wayne Middleton is a 83 y.o. male.  Presents from home with his son for fatigue with hemoglobin of 6.0 in the office yesterday.  He is on Coumadin for atrial fibrillation.  Notes they have been working on his worsening confusion as an outpatient, have been adjusting his medications including decreasing his oxycodone dose for his chronic back pain but he still is having worsening condition, nobody has POA but son is acting as Education officer, environmental today.  Son at bedside is Johanthan Kneeland, his other son Griffen Frayne also participates in patient's care at home reportedly.  Denies any GI bleeding  Is compliant with his medicines per his son, they laid his medications out for him.  He denies any abdominal pain, fevers or vomiting or other complaints.  HPI     Home Medications Prior to Admission medications   Medication Sig Start Date End Date Taking? Authorizing Provider  atenolol (TENORMIN) 25 MG tablet Take 1 tablet (25 mg total) by mouth daily. 04/21/23   Anabel Halon, MD  cetirizine (ZYRTEC) 10 MG tablet Take 10 mg by mouth daily. 12/20/20   [provider]  diltiazem (CARDIZEM CD) 240 MG 24 hr capsule Take 1 capsule (240 mg total) by mouth daily. 04/21/23   Anabel Halon, MD  diphenoxylate-atropine (LOMOTIL) 2.5-0.025 MG tablet Take 1-2 tablets by mouth 4 (four) times daily as needed for diarrhea or loose stools. 02/20/22   [provider]  FLUoxetine (PROZAC) 40 MG capsule Take 1 capsule (40 mg total) by mouth daily. 04/21/23   Anabel Halon, MD  fluticasone (FLONASE) 50 MCG/ACT nasal spray Place 1 spray into both nostrils daily. 03/26/23   Anabel Halon, MD  furosemide (LASIX) 40 MG tablet Take 1 tablet (40 mg total) by mouth daily. 04/21/23 04/20/24  Anabel Halon, MD  meclizine  (ANTIVERT) 25 MG tablet TAKE 1 TABLET THREE TIMES DAILY AS NEEDED FOR DIZZINESS 04/17/23   Anabel Halon, MD  ondansetron (ZOFRAN) 4 MG tablet Take 1 tablet (4 mg total) by mouth every 8 (eight) hours as needed for nausea or vomiting. 04/21/23   Anabel Halon, MD  Oxycodone HCl 10 MG TABS Take 1.5 tablets (15 mg total) by mouth 3 (three) times daily as needed. 04/08/23   Anabel Halon, MD  pantoprazole (PROTONIX) 40 MG tablet Take 1 tablet (40 mg total) by mouth daily. 04/21/23   Anabel Halon, MD  potassium chloride (MICRO-K) 10 MEQ CR capsule Take 1 capsule (10 mEq total) by mouth daily. 04/12/22 12/18/22  Shahmehdi, Gemma Payor, MD  pregabalin (LYRICA) 200 MG capsule Take 1 capsule (200 mg total) by mouth 2 (two) times daily. 04/14/23   Anabel Halon, MD  warfarin (COUMADIN) 5 MG tablet Take 1 tablet (5 mg total) by mouth daily. 04/20/22   Catarina Hartshorn, MD  zolpidem (AMBIEN) 10 MG tablet Take 1 tablet (10 mg total) by mouth at bedtime as needed for sleep. 03/24/23   Anabel Halon, MD      Allergies    Patient has no known allergies.    Review of Systems   Review of Systems  Physical Exam Updated Vital Signs BP 126/65   Pulse 94   Temp 98.2 F (36.8 C) (Oral)   Resp  20   Ht 5\' 8"  (1.727 m)   Wt 87 kg   SpO2 98%   BMI 29.16 kg/m  Physical Exam Vitals and nursing note reviewed.  Constitutional:      General: He is not in acute distress.    Appearance: He is well-developed.  HENT:     Head: Normocephalic and atraumatic.     Mouth/Throat:     Mouth: Mucous membranes are moist.  Eyes:     Extraocular Movements: Extraocular movements intact.     Conjunctiva/sclera: Conjunctivae normal.     Pupils: Pupils are equal, round, and reactive to light.  Cardiovascular:     Rate and Rhythm: Normal rate and regular rhythm.     Heart sounds: No murmur heard. Pulmonary:     Effort: Pulmonary effort is normal. No respiratory distress.     Breath sounds: Normal breath sounds.  Abdominal:      Palpations: Abdomen is soft.     Tenderness: There is no abdominal tenderness.  Genitourinary:    Rectum: Guaiac result positive.     Comments: Rectal exam chaperoned by RN, stool is brown, guaiac positive Musculoskeletal:        General: No swelling. Normal range of motion.     Cervical back: Neck supple.     Right lower leg: No edema.     Left lower leg: No edema.  Skin:    General: Skin is warm and dry.     Capillary Refill: Capillary refill takes less than 2 seconds.  Neurological:     General: No focal deficit present.     Mental Status: He is alert and oriented to person, place, and time.  Psychiatric:        Mood and Affect: Mood normal.     ED Results / Procedures / Treatments   Labs (all labs ordered are listed, but only abnormal results are displayed) Labs Reviewed  CBC WITH DIFFERENTIAL/PLATELET - Abnormal; Notable for the following components:      Result Value   RBC 4.12 (*)    Hemoglobin 5.9 (*)    HCT 23.3 (*)    MCV 56.6 (*)    MCH 14.3 (*)    MCHC 25.3 (*)    RDW 21.8 (*)    nRBC 0.4 (*)    Lymphs Abs 0.6 (*)    All other components within normal limits  COMPREHENSIVE METABOLIC PANEL - Abnormal; Notable for the following components:   Glucose, Bld 135 (*)    Calcium 8.7 (*)    Albumin 3.2 (*)    All other components within normal limits  PROTIME-INR - Abnormal; Notable for the following components:   Prothrombin Time 30.4 (*)    INR 2.9 (*)    All other components within normal limits  POC OCCULT BLOOD, ED - Abnormal; Notable for the following components:   Fecal Occult Bld POSITIVE (*)    All other components within normal limits  URINALYSIS, ROUTINE W REFLEX MICROSCOPIC  PROTIME-INR  BASIC METABOLIC PANEL  CBC  MAGNESIUM  PHOSPHORUS  TYPE AND SCREEN  PREPARE RBC (CROSSMATCH)    EKG EKG Interpretation Date/Time:  Tuesday April 22 2023 12:52:57 EST Ventricular Rate:  87 PR Interval:    QRS Duration:  90 QT Interval:  370 QTC  Calculation: 445 R Axis:   124  Text Interpretation: Atrial fibrillation Right axis deviation Cannot rule out Anterior infarct , age undetermined Abnormal ECG When compared with ECG of 17-Apr-2022 16:27, PREVIOUS ECG IS PRESENT  No significant change since last tracing Confirmed by Derwood Kaplan [phone]) on 04/22/2023 12:58:28 PM  Radiology CT Head Wo Contrast Result Date: 04/22/2023 CLINICAL DATA:  Mental status change of unknown cause EXAM: CT HEAD WITHOUT CONTRAST TECHNIQUE: Contiguous axial images were obtained from the base of the skull through the vertex without intravenous contrast. RADIATION DOSE REDUCTION: This exam was performed according to the departmental dose-optimization program which includes automated exposure control, adjustment of the mA and/or kV according to patient size and/or use of iterative reconstruction technique. COMPARISON:  04/17/2022 FINDINGS: Brain: Age related volume loss. 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 No sign of acute infarction, mass lesion, hemorrhage, hydrocephalus or extra-axial collection. Vascular: There is atherosclerotic calcification of the major vessels at the base of the brain. Skull: Negative Sinuses/Orbits: Clear/normal Other: None IMPRESSION: No acute CT finding. Age related volume loss. 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 Electronically Signed   By: Paulina Fusi M.D.   On: 04/22/2023 17:54    Procedures .Critical Care  Performed by: Ma Rings, PA-C Authorized by: Ma Rings, PA-C   Critical care provider statement:    Critical care time (minutes):  30   Critical care was necessary to treat or prevent imminent or life-threatening deterioration of the following conditions:  Circulatory failure   Critical care was time spent personally by me on the following activities:  Development of treatment plan with patient or surrogate, discussions with consultants, evaluation of patient's response  to treatment, examination of patient, ordering and review of laboratory studies, ordering and review of radiographic studies, ordering and performing treatments and interventions, pulse oximetry, re-evaluation of patient's condition, review of old charts and obtaining history from patient or surrogate   Care discussed with: admitting provider     Care discussed with comment:  Dr. Lucianne Muss, Dr. Levon Hedger     Medications Ordered in ED Medications  pantoprazole (PROTONIX) injection 40 mg (has no administration in time range)  haloperidol lactate (HALDOL) injection 2 mg (has no administration in time range)  QUEtiapine (SEROQUEL) tablet 25 mg (has no administration in time range)  iron sucrose (VENOFER) 300 mg in sodium chloride 0.9 % 250 mL IVPB (has no administration in time range)  sodium chloride flush (NS) 0.9 % injection 3 mL (has no administration in time range)  sodium chloride flush (NS) 0.9 % injection 3 mL (has no administration in time range)  0.9 %  sodium chloride infusion (has no administration in time range)  acetaminophen (TYLENOL) tablet 650 mg (has no administration in time range)    Or  acetaminophen (TYLENOL) suppository 650 mg (has no administration in time range)  ondansetron (ZOFRAN) tablet 4 mg (has no administration in time range)    Or  ondansetron (ZOFRAN) injection 4 mg (has no administration in time range)  phytonadione (VITAMIN K) tablet 10 mg (has no administration in time range)  0.9 %  sodium chloride infusion (Manually program via Guardrails IV Fluids) ( Intravenous New Bag/Given 04/22/23 1714)  haloperidol (HALDOL) tablet 2 mg (2 mg Oral Given 04/22/23 1722)  pantoprazole (PROTONIX) injection 40 mg (40 mg Intravenous Given 04/22/23 1726)  pregabalin (LYRICA) capsule 200 mg (200 mg Oral Given 04/22/23 1722)    ED Course/ Medical Decision Making/ A&P Clinical Course as of 04/22/23 1922  Tue Apr 22, 2023  1806 Patient has been having some increasing confusion over  the past few months, felt to be due to medications per family they have changed his medications,  but he is still having confusion, patient lives by himself, he took him to his PCP yesterday and he had labs due to increasing fatigue and was noted to have hemoglobin of 6.0.  Patient unable to tell me if he is having any black stools, currently refusing rectal exam but has a positive Hemoccult 4 months ago.  He is on Coumadin.  His son is here acting as his decision-maker as they do not have a POA and patient is not oriented to time or place at this time.  Son consented for blood transfusion, workup for worsening mental status including head CT, UA are normal, he is clear lungs, is afebrile no leukocytosis.  Likely worsening dementia.  Patient will need workup for his anemia [CB]  1922 Castaneda updated, he would like 10 mg of vitamin K p.o. tonight [CB]    Clinical Course User Index [CB] Ma Rings, PA-C                                 Medical Decision Making This patient presents to the ED for concern of low hemoglobin, this involves an extensive number of treatment options, and is a complaint that carries with it a high risk of complications and morbidity.  The differential diagnosis includes GI bleeding, anemia, PUD, other   Co morbidities that complicate the patient evaluation :   afib, anticoagulation with coumadin   Additional history obtained:  Additional history obtained from EMR External records from outside source obtained and reviewed including prior notes   Lab Tests:  I Ordered, and personally interpreted labs.  The pertinent results include: INR is 2.9, UA is normal, hemoglobin 5.9, white blood cell count normal, CMP is reassuring   Imaging Studies ordered:  I ordered imaging studies including CT head which shows no acute findings I independently visualized and interpreted imaging within scope of identifying emergent findings  I agree with the radiologist  interpretation    Consultations Obtained:  I requested consultation with the on-call gastroenterologist Dr. Levon Hedger,  and discussed lab and imaging findings as well as pertinent plan - they recommend: Oral vitamin K for Coumadin reversal, n.p.o. after midnight, admit to hospitalist   Problem List / ED Course / Critical interventions / Medication management  Anemia-patient has guaiac positive stool, on Coumadin, remote history of GI bleeding, had transfusion several months ago and refused inpatient admission and workup at that time.  Today I do not feel he has capacity to make decisions and refuse care his son is at bedside and states even having worsening confusion over the past several months, they are working it up outpatient and adjusting his medications as it was felt to be possibly due to polypharmacy, though they suspect he has dementia as well.  His son consented for blood and is agreeable with admission, patient was given some p.o. Haldol and is now more agreeable with admission to hospital at this time, we discussed that he is having internal bleeding and needs likely endoscopy.  Informed that if he does not have workup his severe anemia could worsen and he can have organ dysfunction or death due to hemorrhage.  Discussed with Dr. Levon Hedger, recommended p.o. vitamin K, discussed with hospitalist Dr. Lucianne Muss with admission, recommended 5 mg of p.o. vitamin K.  Patient also got IV Protonix. Also given 2 units of packed red blood cells.  He is tolerating this well  I have reviewed the patients  home medicines and have made adjustments as needed     Amount and/or Complexity of Data Reviewed Labs: ordered. Radiology: ordered.  Risk Prescription drug management. Decision regarding hospitalization.           Final Clinical Impression(s) / ED Diagnoses Final diagnoses:  Iron deficiency anemia due to chronic blood loss  Gastrointestinal hemorrhage, unspecified gastrointestinal  hemorrhage type    Rx / DC Orders ED Discharge Orders     None         Ma Rings, PA-C 04/22/23 1910    Josem Kaufmann 04/22/23 Hilbert Bible, MD 04/23/23 0009

## 2023-04-22 NOTE — ED Notes (Signed)
Family member at bedside made aware of wallet and clothes placed in pt bag with label on it, family member verbalizes understanding

## 2023-04-22 NOTE — ED Notes (Addendum)
Pt attempting to get out of bed, Hx of multiple falls, difficulty in redirecting; However, was able to get pt back in bed, bed monitor placed on pts bed and yellow fall risk bracelet placed on wrist

## 2023-04-22 NOTE — ED Notes (Signed)
Patient transported to CT

## 2023-04-22 NOTE — ED Notes (Addendum)
Next of kin, Danuel Felicetti, gave consent for his father to receive blood products.

## 2023-04-22 NOTE — H&P (Signed)
[facility] History and Physical   Patient: Wayne Middleton NWG:[id number]   PCP: Anabel Halon, MD DOB: 01-31-1941   DOA: 04/22/2023   DOS: 04/22/2023   DOS: the patient was seen and examined on 04/22/2023  Patient coming from: The patient is coming from Home  Chief Complaint: Fatigue, confusion and decreased oral intake  HPI: Wayne Middleton is a 83 y.o. male with Past medical history of chronic A-fib on Coumadin, HTN, diastolic CHF, DM, neuropathy, GERD, chronic pain, depression as reviewed from EMR, presented to [facility] ED with complaining of fatigue, confusion for few months and decreased oral intake.  Patient is not a good historian, developing dementia, patient was brought in by family, patient lives alone.  Patient has anorexia, decreased oral intake and feeling more fatigue and being confused for past few months.  Patient was seen by PCP, hemoglobin was low so patient was sent to ED for further workup and management.   ED Course: VS afebrile, HR 99, RR 18, BP 118/55, 100% on room air FOBT positive CBC Hb 5.9 Transferrin saturation 2% Check B12 and folate level CMP BUN 35, rest within normal range Hemoglobin A1c 6.8 CT head:No acute CT finding. Age related volume loss. 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   Review of Systems: as mentioned in the history of present illness.  All other systems reviewed and are negative.  Past Medical History:  Diagnosis Date   Acute metabolic encephalopathy 04/17/2022   Acute respiratory failure (HCC) 04/09/2022   Atrial fibrillation (HCC)    CAP (community acquired pneumonia) 04/09/2022   Cardiomyopathy (HCC)    Tachycardia mediated, LVEF improved to 50-55% as of 2012   Cholelithiasis    Chronic anticoagulation    Chronic anxiety    Diabetes (HCC)    Essential hypertension    History of GI bleed 2011   Hospitalized for lower GI bleed following polypectomy   Peripheral neuropathy 11/11/2018   Past Surgical History:   Procedure Laterality Date   BACK SURGERY  1971   post-trauma   COLONOSCOPY W/ POLYPECTOMY  2006, 2011   postprocedure bleed requiring hospital admission   TUMOR EXCISION  1960s   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 He has never used smokeless tobacco. He reports that he does not drink alcohol and does not use drugs.  No Known Allergies   Family history reviewed and not pertinent Family History  Problem Relation Age of Onset   Stroke Mother 72   Heart attack Father 29   Lung cancer Brother        3 of 5 brothers deceased due to Carcinoma of the lung     Prior to Admission medications   Medication Sig Start Date End Date Taking? Authorizing Provider  atenolol (TENORMIN) 25 MG tablet Take 1 tablet (25 mg total) by mouth daily. 04/21/23  Yes Anabel Halon, MD  cetirizine (ZYRTEC) 10 MG tablet Take 10 mg by mouth daily. 12/20/20  Yes [provider]  diltiazem (CARDIZEM CD) 240 MG 24 hr capsule Take 1 capsule (240 mg total) by mouth daily. 04/21/23  Yes Anabel Halon, MD  diphenoxylate-atropine (LOMOTIL) 2.5-0.025 MG tablet Take 1-2 tablets by mouth 4 (four) times daily as needed for diarrhea or loose stools. 02/20/22  Yes [provider]  FLUoxetine (PROZAC) 40 MG capsule Take 1 capsule (40 mg total) by mouth daily. 04/21/23  Yes Anabel Halon, MD  fluticasone (FLONASE) 50 MCG/ACT nasal spray Place 1 spray into both nostrils daily. 03/26/23  Yes Anabel Halon, MD  furosemide (LASIX) 40 MG tablet Take 1 tablet (40 mg total) by mouth daily. 04/21/23 04/20/24 Yes Anabel Halon, MD  meclizine (ANTIVERT) 25 MG tablet TAKE 1 TABLET THREE TIMES DAILY AS NEEDED FOR DIZZINESS Patient taking differently: Take 25 mg by mouth 2 (two) times daily as needed for nausea or dizziness. 04/17/23  Yes Anabel Halon, MD  ondansetron (ZOFRAN) 4 MG tablet Take 1 tablet (4 mg total) by mouth every 8 (eight) hours as needed for nausea  or vomiting. 04/21/23  Yes Anabel Halon, MD  Oxycodone HCl 10 MG TABS Take 1.5 tablets (15 mg total) by mouth 3 (three) times daily as needed. Patient taking differently: Take 15 mg by mouth 3 (three) times daily as needed (pain). 04/08/23  Yes Anabel Halon, MD  pantoprazole (PROTONIX) 40 MG tablet Take 1 tablet (40 mg total) by mouth daily. 04/21/23  Yes Anabel Halon, MD  potassium chloride (MICRO-K) 10 MEQ CR capsule Take 1 capsule (10 mEq total) by mouth daily. 04/12/22  Yes Shahmehdi, Seyed A, MD  pregabalin (LYRICA) 200 MG capsule Take 1 capsule (200 mg total) by mouth 2 (two) times daily. 04/14/23  Yes Anabel Halon, MD  warfarin (COUMADIN) 5 MG tablet Take 1 tablet (5 mg total) by mouth daily. 04/20/22  Yes Tat, Onalee Hua, MD  zolpidem (AMBIEN) 10 MG tablet Take 1 tablet (10 mg total) by mouth at bedtime as needed for sleep. 03/24/23  Yes Anabel Halon, MD    Physical Exam: Vitals:   04/22/23 2000 04/22/23 2004 04/22/23 2019 04/22/23 2030  BP: 128/67 128/67 125/67 118/65  Pulse: 88 94 94 89  Resp: (!) 22 18 18 17   Temp:  98.9 F (37.2 C) 98.3 F (36.8 C)   TempSrc:  Oral Oral   SpO2: 96%  95% 94%  Weight:      Height:        General:  and oriented to time, place, and person. Appear in mild distress, affect appropriate Eyes: PERRLA, Conjunctiva normal ENT: Oral Mucosa Clear, moist  Neck: no JVD, no Abnormal Mass Or lumps Cardiovascular: S1 and S2 Present, no Murmur, peripheral pulses symmetrical Respiratory: good respiratory effort, Bilateral Air entry equal and Decreased, no signs of accessory muscle use, Clear to Auscultation, no Crackles, no wheezes Abdomen: Bowel Sound present, Soft and no tenderness, no hernia Skin: no rashes  Extremities: no Pedal edema, no calf tenderness Neurologic: without any new focal findings Gait not checked due to patient safety concerns  Data Reviewed: I have personally reviewed and interpreted labs, imaging as discussed  below.  CBC: Recent Labs  Lab 04/21/23 1406 04/22/23 1208  WBC 6.0 5.5  NEUTROABS 4.2 4.1  HGB 6.0* 5.9*  HCT 23.8* 23.3*  MCV 57* 56.6*  PLT 199 219   Basic Metabolic Panel: Recent Labs  Lab 04/21/23 1406 04/22/23 1208  NA 136 135  K 4.1 3.8  CL 99 100  CO2 24 27  GLUCOSE 103* 135*  BUN 21 19  CREATININE 1.27 1.02  CALCIUM 8.8 8.7*   GFR: Estimated Creatinine Clearance: 58.8 mL/min (by C-G formula based on SCr of 1.02 mg/dL). Liver Function Tests: Recent Labs  Lab 04/21/23 1406 04/22/23 1208  AST 17 16  ALT 14 14  ALKPHOS 108 75  BILITOT 0.6 0.8  PROT 6.4 6.9  ALBUMIN 3.7 3.2*   No  results for input(s): "LIPASE", "AMYLASE" in the last 168 hours. No results for input(s): "AMMONIA" in the last 168 hours. Coagulation Profile: Recent Labs  Lab 04/22/23 1208  INR 2.9*   Cardiac Enzymes: No results for input(s): "CKTOTAL", "CKMB", "CKMBINDEX", "TROPONINI" in the last 168 hours. BNP (last 3 results) No results for input(s): "PROBNP" in the last 8760 hours. HbA1C: Recent Labs    04/21/23 1406  HGBA1C 6.8*   CBG: No results for input(s): "GLUCAP" in the last 168 hours. Lipid Profile: No results for input(s): "CHOL", "HDL", "LDLCALC", "TRIG", "CHOLHDL", "LDLDIRECT" in the last 72 hours. Thyroid Function Tests: No results for input(s): "TSH", "T4TOTAL", "FREET4", "T3FREE", "THYROIDAB" in the last 72 hours. Anemia Panel: Recent Labs    04/21/23 1406  FERRITIN 29*  TIBC 411  IRON 9*   Urine analysis:    Component Value Date/Time   COLORURINE YELLOW 04/22/2023 1249   APPEARANCEUR CLEAR 04/22/2023 1249   LABSPEC 1.009 04/22/2023 1249   PHURINE 5.0 04/22/2023 1249   GLUCOSEU NEGATIVE 04/22/2023 1249   HGBUR NEGATIVE 04/22/2023 1249   BILIRUBINUR NEGATIVE 04/22/2023 1249   KETONESUR NEGATIVE 04/22/2023 1249   PROTEINUR NEGATIVE 04/22/2023 1249   NITRITE NEGATIVE 04/22/2023 1249   LEUKOCYTESUR NEGATIVE 04/22/2023 1249    Radiological Exams on  Admission: CT Head Wo Contrast Result Date: 04/22/2023 CLINICAL DATA:  Mental status change of unknown cause EXAM: CT HEAD WITHOUT CONTRAST TECHNIQUE: Contiguous axial images were obtained from the base of the skull through the vertex without intravenous contrast. RADIATION DOSE REDUCTION: This exam was performed according to the departmental dose-optimization program which includes automated exposure control, adjustment of the mA and/or kV according to patient size and/or use of iterative reconstruction technique. COMPARISON:  04/17/2022 FINDINGS: Brain: Age related volume loss. 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 No sign of acute infarction, mass lesion, hemorrhage, hydrocephalus or extra-axial collection. Vascular: There is atherosclerotic calcification of the major vessels at the base of the brain. Skull: Negative Sinuses/Orbits: Clear/normal Other: None IMPRESSION: No acute CT finding. Age related volume loss. 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 Electronically Signed   By: Paulina Fusi M.D.   On: 04/22/2023 17:54   EKG: Independently reviewed. atrial fibrillation, rate 87. Echocardiogram:   I reviewed all nursing notes, pharmacy notes, vitals, pertinent old records.  Assessment/Plan Principal Problem:   GI bleeding   # GI bleeding Melena, FOBT positive, Hb 5.9 on admission Transfuse 1 unit of PRBC Started PPI twice daily Started clear liquid diet, keep n.p.o. after midnight GI consulted by ED physician for possible scope tomorrow a.m. INR 2.9, vitamin K 2.5 mg one-time dose given in the ED  # Iron deficiency Transferrin saturation 2% Started Venofer 300 mg IV daily x 3 days  # Chronic A-fib, controlled VR, HTN, diastolic CHF Held Coumadin for now due to GI bleeding, monitor INR Resumed atenolol and Cardizem CD home dose with holding parameters Monitor BP and titrate medications accordingly  # NIDDM T2 and neuropathy Hemoglobin A1c  6.8, well-controlled, currently patient is not on any medication at home Continue diabetic diet,  Monitor CBG, started NovoLog sliding scale Resumed home dose Lyrica  # Chronic pain and depression Resumed Prozac, oxycodone and Ambien as needed Home meds  # Dementia and delirium Continue Haldol and Seroquel as needed   Nutrition: Cardiac and Carb modified diet, npo AFTER mn DVT Prophylaxis: scd  Advance goals of care discussion: Full code   Consults: GI consulted by ED physician  Family Communication: family was not present at bedside, at the time of interview.  Opportunity was given to ask question and all questions were answered satisfactorily.  Disposition: Admitted as inpatient, telemetry unit. Likely to be discharged Home, in 2-3 days when stable and cleared by GI.  I have discussed plan of care as described above with RN and patient/family.  Severity of Illness: The appropriate patient status for this patient is INPATIENT. Inpatient status is judged to be reasonable and necessary in order to provide the required intensity of service to ensure the patient's safety. The patient's presenting symptoms, physical exam findings, and initial radiographic and laboratory data in the context of their chronic comorbidities is felt to place them at high risk for further clinical deterioration. Furthermore, it is not anticipated that the patient will be medically stable for discharge from the hospital within 2 midnights of admission.   * I certify that at the point of admission it is my clinical judgment that the patient will require inpatient hospital care spanning beyond 2 midnights from the point of admission due to high intensity of service, high risk for further deterioration and high frequency of surveillance required.*   Author: Gillis Santa, MD [facility] 04/22/2023 9:30 PM   To reach On-call, see care teams to locate the attending and reach out to them via [email or web address]. If  7PM-7AM, please contact night-coverage If you still have difficulty reaching the attending provider, please page the [facility] (Director on Call) for [facility] on amion for assistance.

## 2023-04-22 NOTE — Telephone Encounter (Signed)
Received fax after call hours critical lab report results. Lab tech stephanie called said doctor was paged. Copy in provider box.

## 2023-04-22 NOTE — ED Notes (Signed)
Per PA-C, pt may have some apple sauce and/or jello. Pt given that snack as pt states "I am starving I need something now"

## 2023-04-22 NOTE — ED Notes (Signed)
Pt is not A&O x 4, unaware of situation, pt does know his name though. Family member at bedside states that this is his baseline level of confusion

## 2023-04-23 DIAGNOSIS — D500 Iron deficiency anemia secondary to blood loss (chronic): Principal | ICD-10-CM

## 2023-04-23 DIAGNOSIS — K922 Gastrointestinal hemorrhage, unspecified: Secondary | ICD-10-CM | POA: Diagnosis not present

## 2023-04-23 DIAGNOSIS — D509 Iron deficiency anemia, unspecified: Secondary | ICD-10-CM

## 2023-04-23 DIAGNOSIS — R195 Other fecal abnormalities: Secondary | ICD-10-CM | POA: Diagnosis not present

## 2023-04-23 LAB — CBC WITH DIFFERENTIAL/PLATELET
Basophils Absolute: 0.1 10*3/uL (ref 0.0–0.2)
Basos: 1 %
EOS (ABSOLUTE): 0.1 10*3/uL (ref 0.0–0.4)
Eos: 1 %
Hematocrit: 23.8 % — ABNORMAL LOW (ref 37.5–51.0)
Hemoglobin: 6 g/dL — CL (ref 13.0–17.7)
Immature Grans (Abs): 0 10*3/uL (ref 0.0–0.1)
Immature Granulocytes: 0 %
Lymphocytes Absolute: 0.8 10*3/uL (ref 0.7–3.1)
Lymphs: 13 %
MCH: 14.4 pg — ABNORMAL LOW (ref 26.6–33.0)
MCHC: 25.2 g/dL — ABNORMAL LOW (ref 31.5–35.7)
MCV: 57 fL — ABNORMAL LOW (ref 79–97)
Monocytes Absolute: 0.8 10*3/uL (ref 0.1–0.9)
Monocytes: 14 %
NRBC: 1 % — ABNORMAL HIGH (ref 0–0)
Neutrophils Absolute: 4.2 10*3/uL (ref 1.4–7.0)
Neutrophils: 71 %
Platelets: 199 10*3/uL (ref 150–450)
RBC: 4.17 x10E6/uL (ref 4.14–5.80)
RDW: 20.7 % — ABNORMAL HIGH (ref 11.6–15.4)
WBC: 6 10*3/uL (ref 3.4–10.8)

## 2023-04-23 LAB — CBC
HCT: 27 % — ABNORMAL LOW (ref 39.0–52.0)
Hemoglobin: 7.2 g/dL — ABNORMAL LOW (ref 13.0–17.0)
MCH: 15.9 pg — ABNORMAL LOW (ref 26.0–34.0)
MCHC: 26.7 g/dL — ABNORMAL LOW (ref 30.0–36.0)
MCV: 59.5 fL — ABNORMAL LOW (ref 80.0–100.0)
Platelets: 207 10*3/uL (ref 150–400)
RBC: 4.54 MIL/uL (ref 4.22–5.81)
RDW: 26.2 % — ABNORMAL HIGH (ref 11.5–15.5)
WBC: 5.8 10*3/uL (ref 4.0–10.5)
nRBC: 0.3 % — ABNORMAL HIGH (ref 0.0–0.2)

## 2023-04-23 LAB — GLUCOSE, CAPILLARY
Glucose-Capillary: 101 mg/dL — ABNORMAL HIGH (ref 70–99)
Glucose-Capillary: 113 mg/dL — ABNORMAL HIGH (ref 70–99)
Glucose-Capillary: 107 mg/dL — ABNORMAL HIGH (ref 70–99)
Glucose-Capillary: 229 mg/dL — ABNORMAL HIGH (ref 70–99)

## 2023-04-23 LAB — BASIC METABOLIC PANEL
Anion gap: 8 (ref 5–15)
BUN: 13 mg/dL (ref 8–23)
CO2: 25 mmol/L (ref 22–32)
Calcium: 8.4 mg/dL — ABNORMAL LOW (ref 8.9–10.3)
Chloride: 102 mmol/L (ref 98–111)
Creatinine, Ser: 0.89 mg/dL (ref 0.61–1.24)
GFR, Estimated: >60 mL/min (ref >60)
Glucose, Bld: 106 mg/dL — ABNORMAL HIGH (ref 70–99)
Potassium: 3.6 mmol/L (ref 3.5–5.1)
Sodium: 135 mmol/L (ref 135–145)

## 2023-04-23 LAB — CMP14+EGFR
ALT: 14 [IU]/L (ref 0–44)
AST: 17 [IU]/L (ref 0–40)
Albumin: 3.7 g/dL (ref 3.7–4.7)
Alkaline Phosphatase: 108 [IU]/L (ref 44–121)
BUN/Creatinine Ratio: 17 (ref 10–24)
BUN: 21 mg/dL (ref 8–27)
Bilirubin Total: 0.6 mg/dL (ref 0.0–1.2)
CO2: 24 mmol/L (ref 20–29)
Calcium: 8.8 mg/dL (ref 8.6–10.2)
Chloride: 99 mmol/L (ref 96–106)
Creatinine, Ser: 1.27 mg/dL (ref 0.76–1.27)
Globulin, Total: 2.7 g/dL (ref 1.5–4.5)
Glucose: 103 mg/dL — ABNORMAL HIGH (ref 70–99)
Potassium: 4.1 mmol/L (ref 3.5–5.2)
Sodium: 136 mmol/L (ref 134–144)
Total Protein: 6.4 g/dL (ref 6.0–8.5)
eGFR: 56 mL/min/{1.73_m2} — ABNORMAL LOW (ref >59)

## 2023-04-23 LAB — HEMOGLOBIN A1C
Est. average glucose Bld gHb Est-mCnc: 148 mg/dL
Hgb A1c MFr Bld: 6.8 % — ABNORMAL HIGH (ref 4.8–5.6)

## 2023-04-23 LAB — PHOSPHORUS: Phosphorus: 2.5 mg/dL (ref 2.5–4.6)

## 2023-04-23 LAB — IRON,TIBC AND FERRITIN PANEL
Ferritin: 29 ng/mL — ABNORMAL LOW (ref 30–400)
Iron Saturation: 2 % — CL (ref 15–55)
Iron: 9 ug/dL — CL (ref 38–169)
Total Iron Binding Capacity: 411 ug/dL (ref 250–450)
UIBC: 402 ug/dL — ABNORMAL HIGH (ref 111–343)

## 2023-04-23 LAB — PROTIME-INR
INR: 3.3 — ABNORMAL HIGH (ref 0.8–1.2)
Prothrombin Time: 34.1 s — ABNORMAL HIGH (ref 11.4–15.2)

## 2023-04-23 LAB — MAGNESIUM: Magnesium: 2 mg/dL (ref 1.7–2.4)

## 2023-04-23 LAB — VITAMIN B12: Vitamin B-12: 626 pg/mL (ref 180–914)

## 2023-04-23 LAB — FOLATE: Folate: 46.5 ng/mL (ref >5.9)

## 2023-04-23 LAB — PREPARE RBC (CROSSMATCH)

## 2023-04-23 LAB — CBG MONITORING, ED: Glucose-Capillary: 122 mg/dL — ABNORMAL HIGH (ref 70–99)

## 2023-04-23 MED ORDER — FUROSEMIDE 10 MG/ML IJ SOLN
20.0000 mg | Freq: Once | INTRAMUSCULAR | Status: AC
  Administered 2023-04-23: 20 mg via INTRAVENOUS
  Filled 2023-04-23: qty 2

## 2023-04-23 MED ORDER — SODIUM CHLORIDE 0.9% IV SOLUTION: Freq: Once | INTRAVENOUS | Status: AC

## 2023-04-23 NOTE — Telephone Encounter (Signed)
Pt was informed to go to ED per Dr. Allena Katz on 04/22/23. Pt stated understanding

## 2023-04-23 NOTE — Progress Notes (Signed)
Progress Note   Patient: Wayne Middleton BJY:[id number] DOB: 1940-09-30 DOA: 04/22/2023     1 DOS: the patient was seen and examined on 04/23/2023   Brief hospital admission course: As per H&P written by Dr. Lucianne Muss on 04/22/2023 Wayne Middleton is a 83 y.o. male with Past medical history of chronic A-fib on Coumadin, HTN, diastolic CHF, DM, neuropathy, GERD, chronic pain, depression as reviewed from EMR, presented to [facility] ED with complaining of fatigue, confusion for few months and decreased oral intake.  Patient is not a good historian, developing dementia, patient was brought in by family, patient lives alone.  Patient has anorexia, decreased oral intake and feeling more fatigue and being confused for past few months.  Patient was seen by PCP, hemoglobin was low so patient was sent to ED for further workup and management.    ED Course: VS afebrile, HR 99, RR 18, BP 118/55, 100% on room air FOBT positive CBC Hb 5.9 Transferrin saturation 2% Check B12 and folate level CMP BUN 35, rest within normal range Hemoglobin A1c 6.8 CT head:No acute CT finding. Age related volume loss. 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  Assessment and Plan: 1-GI bleed -With concern for upper GI bleed in the setting of melenic stools, positive fecal occult blood test, hemoglobin of 5.9 at time of admission in a patient with chronic use of Coumadin. -Vitamin K and 2 units PRBCs has been transfused -IV iron also provided -Continue to follow hemoglobin trend -After discussing with GI service plans will be for endoscopy and colonoscopy on 04/28/2023 to allow for complete washout of Coumadin. -Continue supportive care and follow clinical response. -Continue PPI.  2-iron deficiency anemia -IV iron will be provided -Planning endoscopy and colonoscopy as part of workup.  3-chronic A-fib, hypertension and chronic diastolic CHF. -With underlying history of hypertension and diastolic heart failure -Holding  Coumadin in the setting of GI bleed -Continue home antihypertensive regimen using atenolol and Cardizem -Follow blood pressure and adjust antihypertensive agents as needed -Continue to follow daily weights and strict intake and output. -Chronic diastolic heart failure currently compensated.  4-type 2 diabetes with neuropathy -Most recent A1c 6.8 -Continue sliding scale insulin -Resume home dose Lyrica.  5-dementia and history of delirium -Continue Haldol and as needed Seroquel. -Mentation currently stable and adequately following commands.  6-chronic pain/depression -Continue Prozac and as needed oxycodone -Avoid the use of NSAIDs in the setting of acute GI bleed.  Subjective:  Expressing generalized weakness; no chest pain, no nausea, no vomiting or palpitations.  Patient is very hungry.  Physical Exam: Vitals:   04/23/23 0930 04/23/23 0952 04/23/23 1218 04/23/23 1342  BP: 107/62 (!) 102/59 (!) 109/46 (!) 98/54  Pulse: 74 72 (!) 56 65  Resp: 16     Temp: 98 F (36.7 C) 99.7 F (37.6 C) 97.8 F (36.6 C) 98 F (36.7 C)  TempSrc: Oral Oral Oral Oral  SpO2: 97% 97% 97% 97%  Weight:      Height:       General exam: Alert, awake, following commands appropriately. Respiratory system: Good saturation on room air. Cardiovascular system: Rate controlled, no rubs, no gallops, no JVD on exam. Gastrointestinal system: Abdomen is nondistended, soft and nontender. No organomegaly or masses felt. Normal bowel sounds heard. Central nervous system: Moving 4 limbs spontaneously.  No focal neurological deficits. Extremities: No cyanosis or clubbing. Skin: No petechiae. Psychiatry: Flat affect on exam.     Data Reviewed: Basic metabolic panel: Sodium 135,  potassium 3.6, chloride 102, bicarb 25, BUN 13, creatinine 0.99 and GFR >60 CBC: WBCs 5.8, hemoglobin 7.2 and platelet count 207K.  Family Communication: No family at bedside.  Disposition: Status is: Inpatient Remains inpatient  appropriate because: Continue treatment for acute GI bleed.   Planned Discharge Destination: Home   Time spent: 50 minutes  Author: Vassie Loll, MD 04/23/2023 6:59 PM  For on call review [email or web address].

## 2023-04-23 NOTE — ED Notes (Addendum)
This RN made MD Adefeso aware that the pts heart rate dropped below 55bpm as order and made him aware that the pt is in Afib.

## 2023-04-23 NOTE — TOC Initial Note (Signed)
Transition of Care [facility]) - Initial/Assessment Note    Patient Details  Name: Wayne Middleton MRN: [id number] Date of Birth: 14-Aug-1940  [facility]) CM/SW Contact:    Villa Herb, LCSWA Phone Number: 04/23/2023, 1:08 PM  Clinical Narrative:                 Pt is high risk for readmission. CSW spoke with pt at bedside to complete assessment. Pt states that he lives alone. Pt is independent in completing his ADLs. Pt drives himself when needed. Pt states that he has had HH in the past. Pt states that he does not use and DME. TOC to follow.   Expected Discharge Plan: Home w Home Health Services Barriers to Discharge: Continued Medical Work up   Patient Goals and CMS Choice Patient states their goals for this hospitalization and ongoing recovery are:: return home CMS Medicare.gov Compare Post Acute Care list provided to:: Patient Choice offered to / list presented to : Patient      Expected Discharge Plan and Services In-house Referral: Clinical Social Work Discharge Planning Services: CM Consult   Living arrangements for the past 2 months: Single Family Home                                      Prior Living Arrangements/Services Living arrangements for the past 2 months: Single Family Home Lives with:: Self Patient language and need for interpreter reviewed:: Yes Do you feel safe going back to the place where you live?: Yes      Need for Family Participation in Patient Care: Yes (Comment) Care giver support system in place?: Yes (comment)   Criminal Activity/Legal Involvement Pertinent to Current Situation/Hospitalization: No - Comment as needed  Activities of Daily Living   ADL Screening (condition at time of admission) Independently performs ADLs?: Yes (appropriate for developmental age) Is the patient deaf or have difficulty hearing?: No Does the patient have difficulty seeing, even when wearing glasses/contacts?: No Does the patient have  difficulty concentrating, remembering, or making decisions?: No  Permission Sought/Granted                  Emotional Assessment Appearance:: Appears stated age Attitude/Demeanor/Rapport: Engaged Affect (typically observed): Accepting Orientation: : Oriented to Self, Oriented to Place, Oriented to  Time, Oriented to Situation Alcohol / Substance Use: Not Applicable Psych Involvement: No (comment)  Admission diagnosis:  GI bleeding [K92.2] Iron deficiency anemia due to chronic blood loss [D50.0] Gastrointestinal hemorrhage, unspecified gastrointestinal hemorrhage type [K92.2] Patient Active Problem List   Diagnosis Date Noted   GI bleeding 04/22/2023   Gastroesophageal reflux disease 04/21/2023   Gait disturbance 04/21/2023   Dizziness 03/26/2023   Allergic rhinitis 03/26/2023   ABLA (acute blood loss anemia) 12/19/2022   Type 2 diabetes mellitus with diabetic neuropathy, unspecified (HCC) 09/16/2022   Atherosclerosis of aorta (HCC) 08/30/2022   Pulmonary emphysema (HCC) 08/30/2022   Recurrent major depressive disorder, in partial remission (HCC) 08/30/2022   Insomnia 08/30/2022   Chronic pain syndrome 08/30/2022   Chronic, continuous use of opioids 08/30/2022   Lymphedema 08/30/2022   Acute sinusitis 08/30/2022   Spinal stenosis of lumbar region with neurogenic claudication 08/30/2022   Permanent atrial fibrillation (HCC) 04/18/2022   Chronic diastolic CHF (congestive heart failure) (HCC) 04/09/2022   Peripheral neuropathy 11/11/2018   History of cardiomyopathy 11/03/2013   Chronic atrial fibrillation with RVR (HCC)  Chronic anticoagulation    Essential hypertension, benign    PCP:  Anabel Halon, MD Pharmacy:   [facility] - [place], [place] - [address] [place] [address] Phone: [phone] Fax: [phone]  [facility] - [place], [place] - [address] [place]  [address] Phone: [phone] Fax: [phone]     Social Drivers of Health (SDOH) Social History: SDOH Screenings   Food Insecurity: No Food Insecurity (04/23/2023)  Housing: Low Risk  (04/23/2023)  Transportation Needs: No Transportation Needs (04/23/2023)  Utilities: Not At Risk (04/23/2023)  Depression (PHQ2-9): Low Risk  (04/21/2023)  Social Connections: Moderately Integrated (04/23/2023)  Tobacco Use: Low Risk  (04/22/2023)   SDOH Interventions:     Readmission Risk Interventions    04/23/2023    1:07 PM  Readmission Risk Prevention Plan  Transportation Screening Complete  HRI or Home Care Consult Complete  Social Work Consult for Recovery Care Planning/Counseling Complete  Palliative Care Screening Not Applicable  Medication Review Oceanographer) Complete

## 2023-04-23 NOTE — Consult Note (Addendum)
Gastroenterology Consult    Attestation: Patient seen and examined.  Database reviewed.  Endoscopy report from 2011 reviewed.  Discussed at length with Doylene Bode, NP.  Agree with our consultation note as outlined.  This is a 83 year old gentleman with multiple comorbidities including atrial fibrillation on Coumadin brought to the hospital by his son for diminished mentation fatigue ,hemoglobin 5.9 on admission.  He is iron deficient.  Occult blood positive on rectal exam.  No history of hematemesis, melena or rectal bleeding.  He remains hemodynamically stable.  He has responded appropriately to packed RBC transfusion.  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 Karilyn Cota about 14 years ago without any subsequent surveillance.  Patient recalls a bad experience and describes a post polypectomy bleed requiring 2 units of packed RBCs.  Previously. Lengthy discussion with the patient and his son at the bedside this evening regarding the utility importance of endoscopic evaluation.  Patient is rather bitter about his colonoscopy experience 14 years ago.  However, he is willing to undergo endoscopic evaluation (EGD and colonoscopy) while he is here.  It will be a few days before his INR normalizes to the point we can proceed with an EGD and colonoscopy.  Reversal of anticoagulation at this point is not warranted.  I suspect he may well have significant pathology in his colon.  It would not be surprising if he has a frank carcinoma.  Since he does not have any particularly focal symptoms I do advocate both an EGD and a colonoscopy.  Patient states he has to get something to eat prior to undergoing endoscopic evaluation.  This is not at all unreasonable as it may be 3-4 days from now before he undergoes evaluation.  I suspect he will remain in the hospital until that time  Will go ahead and advance his diet for the time being and  reassess tomorrow morning  Agree with trending H&H and PPI.    Referring Provider: No ref. provider found Primary Care Physician:  Anabel Halon, MD Primary Gastroenterologist:  not established  Patient ID: Wayne Middleton; [id number]; 1940-11-10   Admit date: 04/22/2023  LOS: 1 day   Date of Consultation: 04/23/2023  Reason for Consultation:  anemia   History of Present Illness   ISAM UNREIN is a 83 y.o. year old male with history of acute respiratory failure, atrial fibrillation chronically anticoagulated, cardiomyopathy, anxiety, diabetes, hypertension, history of GI bleed, peripheral neuropathy who presented to the ED with his son for complaints of confusion and outpatient hemoglobin of 6.  Family endorsed decreased oral intake and patient being more fatigued and confused for the past few months.  No complaints of GI bleeding  ED course: Afebrile, vital signs stable FOBT positive Hemoglobin 5.9 Saturation 2%, iron 9, ferritin 29 INR 2.9 B12 and folate within normal limits  Consult: Patient alert, oriented to person situation and place, disoriented to time.   states his son brought him to the hospital because he was sleeping and they could not get him up.  Patient states he always feels bad as he is old. Denies any abdominal pain. He denies rectal bleeding or melena. Denies constipation or diarrhea. Feels he has been eating the same as usual. Denies nausea or vomiting. Does not feel he needs to be at the hospital recently.   Patient reports he has had multiple blood transfusions over the years. States he was never told why he needed these as  they never found a reason.   Last dose of Coumadin was yesterday morning around 6 am.   Hgb up to 7.2 s/p 2 units blood transfusion, currently transfusing 3rd unit   Last ZOX:WRUE years ago Last colonoscopy: many years ago   Past Medical History:  Diagnosis Date   Acute metabolic encephalopathy 04/17/2022   Acute respiratory  failure (HCC) 04/09/2022   Atrial fibrillation (HCC)    CAP (community acquired pneumonia) 04/09/2022   Cardiomyopathy (HCC)    Tachycardia mediated, LVEF improved to 50-55% as of 2012   Cholelithiasis    Chronic anticoagulation    Chronic anxiety    Diabetes (HCC)    Essential hypertension    History of GI bleed 2011   Hospitalized for lower GI bleed following polypectomy   Peripheral neuropathy 11/11/2018    Past Surgical History:  Procedure Laterality Date   BACK SURGERY  1971   post-trauma   COLONOSCOPY W/ POLYPECTOMY  2006, 2011   postprocedure bleed requiring hospital admission   TUMOR EXCISION  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 Authorizing Provider  atenolol (TENORMIN) 25 MG tablet Take 1 tablet (25 mg total) by mouth daily. 04/21/23  Yes Anabel Halon, MD  cetirizine (ZYRTEC) 10 MG tablet Take 10 mg by mouth daily. 12/20/20  Yes [provider]  diltiazem (CARDIZEM CD) 240 MG 24 hr capsule Take 1 capsule (240 mg total) by mouth daily. 04/21/23  Yes Anabel Halon, MD  diphenoxylate-atropine (LOMOTIL) 2.5-0.025 MG tablet Take 1-2 tablets by mouth 4 (four) times daily as needed for diarrhea or loose stools. 02/20/22  Yes [provider]  FLUoxetine (PROZAC) 40 MG capsule Take 1 capsule (40 mg total) by mouth daily. 04/21/23  Yes Anabel Halon, MD  fluticasone (FLONASE) 50 MCG/ACT nasal spray Place 1 spray into both nostrils daily. 03/26/23  Yes Anabel Halon, MD  furosemide (LASIX) 40 MG tablet Take 1 tablet (40 mg total) by mouth daily. 04/21/23 04/20/24 Yes Anabel Halon, MD  meclizine (ANTIVERT) 25 MG tablet TAKE 1 TABLET THREE TIMES DAILY AS NEEDED FOR DIZZINESS Patient taking differently: Take 25 mg by mouth 2 (two) times daily as needed for nausea or dizziness. 04/17/23  Yes Anabel Halon, MD  ondansetron (ZOFRAN) 4 MG tablet Take 1 tablet (4 mg total) by mouth  every 8 (eight) hours as needed for nausea or vomiting. 04/21/23  Yes Anabel Halon, MD  Oxycodone HCl 10 MG TABS Take 1.5 tablets (15 mg total) by mouth 3 (three) times daily as needed. Patient taking differently: Take 15 mg by mouth 3 (three) times daily as needed (pain). 04/08/23  Yes Anabel Halon, MD  pantoprazole (PROTONIX) 40 MG tablet Take 1 tablet (40 mg total) by mouth daily. 04/21/23  Yes Anabel Halon, MD  potassium chloride (MICRO-K) 10 MEQ CR capsule Take 1 capsule (10 mEq total) by mouth daily. 04/12/22  Yes Shahmehdi, Seyed A, MD  pregabalin (LYRICA) 200 MG capsule Take 1 capsule (200 mg total) by mouth 2 (two) times daily. 04/14/23  Yes Anabel Halon, MD  warfarin (COUMADIN) 5 MG tablet Take 1 tablet (5 mg total) by mouth daily. 04/20/22  Yes Tat, Onalee Hua, MD  zolpidem (AMBIEN) 10 MG tablet Take 1 tablet (10 mg total) by mouth at bedtime as needed for sleep. 03/24/23  Yes Anabel Halon, MD  Current Facility-Administered Medications  Medication Dose Route Frequency Provider Last Rate Last Admin   0.9 %  sodium chloride infusion  250 mL Intravenous PRN Gillis Santa, MD       acetaminophen (TYLENOL) tablet 650 mg  650 mg Oral Q6H PRN Gillis Santa, MD       Or   acetaminophen (TYLENOL) suppository 650 mg  650 mg Rectal Q6H PRN Gillis Santa, MD       atenolol (TENORMIN) tablet 25 mg  25 mg Oral Daily Gillis Santa, MD   25 mg at 04/23/23 0858   diltiazem (CARDIZEM CD) 24 hr capsule 240 mg  240 mg Oral Daily Gillis Santa, MD   240 mg at 04/23/23 0857   FLUoxetine (PROZAC) capsule 40 mg  40 mg Oral Daily Gillis Santa, MD   40 mg at 04/23/23 0857   haloperidol lactate (HALDOL) injection 2 mg  2 mg Intravenous Q6H PRN Gillis Santa, MD       insulin aspart (novoLOG) injection 0-15 Units  0-15 Units Subcutaneous TID WC Gillis Santa, MD       iron sucrose (VENOFER) 300 mg in sodium chloride 0.9 % 250 mL IVPB  300 mg Intravenous Daily Gillis Santa, MD   Stopped at 04/23/23 0821    ondansetron (ZOFRAN) tablet 4 mg  4 mg Oral Q6H PRN Gillis Santa, MD       Or   ondansetron [facility]) injection 4 mg  4 mg Intravenous Q6H PRN Gillis Santa, MD       oxyCODONE (Oxy IR/ROXICODONE) immediate release tablet 15 mg  15 mg Oral TID PRN Gillis Santa, MD   15 mg at 04/22/23 2354   pantoprazole (PROTONIX) injection 40 mg  40 mg Intravenous Q12H Gillis Santa, MD   40 mg at 04/23/23 0857   pregabalin (LYRICA) capsule 200 mg  200 mg Oral BID Gillis Santa, MD   200 mg at 04/23/23 0857   QUEtiapine (SEROQUEL) tablet 25 mg  25 mg Oral QHS PRN Gillis Santa, MD       sodium chloride flush (NS) 0.9 % injection 3 mL  3 mL Intravenous Q12H Gillis Santa, MD   3 mL at 04/22/23 2202   sodium chloride flush (NS) 0.9 % injection 3 mL  3 mL Intravenous PRN Gillis Santa, MD       zolpidem Remus Loffler) tablet 10 mg  10 mg Oral QHS PRN Gillis Santa, MD        Allergies as of 04/22/2023   (No Known Allergies)    Family History  Problem Relation Age of Onset   Stroke Mother 58   Heart attack Father 73   Lung cancer Brother        3 of 5 brothers deceased due to Carcinoma of the lung    Social History   Socioeconomic History   Marital status: Widowed    Spouse name: Not on file   Number of children: Not on file   Years of education: Not on file   Highest education level: Not on file  Occupational History   Occupation: Retired from [employer]  Tobacco Use   Smoking status: Never   Smokeless tobacco: Never  Vaping Use   Vaping status: Never Used  Substance and Sexual Activity   Alcohol use: No    Alcohol/week: 0.0 standard drinks of alcohol   Drug use: No   Sexual activity: Not Currently  Other Topics Concern   Not on file  Social History Narrative  Patient previously walked four miles a day   Social Drivers of Corporate investment banker Strain: Not on file  Food Insecurity: No Food Insecurity (04/10/2022)   Hunger Vital Sign    Worried About Running Out of  Food in the Last Year: Never true    Ran Out of Food in the Last Year: Never true  Transportation Needs: No Transportation Needs (04/10/2022)   PRAPARE - Administrator, Civil Service (Medical): No    Lack of Transportation (Non-Medical): No  Physical Activity: Not on file  Stress: Not on file  Social Connections: Not on file  Intimate Partner Violence: Not At Risk (04/10/2022)   Humiliation, Afraid, Rape, and Kick questionnaire    Fear of Current or Ex-Partner: No    Emotionally Abused: No    Physically Abused: No    Sexually Abused: No     Review of Systems   Gen: Denies any fever, chills, loss of appetite, change in weight or weight loss CV: Denies chest pain, heart palpitations, syncope, edema  Resp: Denies shortness of breath with rest, cough, wheezing, coughing up blood, and pleurisy. GI: Denies vomiting blood, jaundice, and fecal incontinence.   Denies dysphagia or odynophagia. GU : Denies urinary burning, blood in urine, urinary frequency, and urinary incontinence. MS: Denies joint pain, limitation of movement, swelling, cramps, and atrophy.  Derm: Denies rash, itching, dry skin, hives. Psych: Denies depression, anxiety, memory loss, hallucinations, and confusion. Heme: Denies bruising or bleeding Neuro:  Denies any headaches, dizziness, paresthesias, shaking  Physical Exam   Vital Signs in last 24 hours: Temp:  [98 F (36.7 C)-98.9 F (37.2 C)] 98.3 F (36.8 C) (02/12 0814) Pulse Rate:  [71-99] 80 (02/12 0814) Resp:  [12-27] 20 (02/12 0814) BP: (103-135)/(50-99) 122/62 (02/12 0814) SpO2:  [94 %-100 %] 98 % (02/12 0814) Weight:  [87 kg] 87 kg (02/11 1205) Last BM Date : 04/22/23  General:   Alert,  Well-developed, well-nourished, irritable  Head:  Normocephalic and atraumatic. Eyes:  Sclera clear, no icterus.   Conjunctiva pink. Ears:  Normal auditory acuity. Mouth:  No deformity or lesions, dentition normal. Neck:  Supple; no masses Lungs:  Clear  throughout to auscultation.   No wheezes, crackles, or rhonchi. No acute distress. Heart:  Regular rate and rhythm; no murmurs, clicks, rubs,  or gallops. Abdomen:  Soft, nontender and nondistended. No masses, hepatosplenomegaly or hernias noted. Normal bowel sounds, without guarding, and without rebound.   Msk:  Symmetrical without gross deformities. Normal posture. Extremities:  Without clubbing or edema. Neurologic:  Alert and  oriented x4. Skin:  Intact without significant lesions or rashes. Psych:  Alert, irritable   Labs/Studies   Recent Labs Recent Labs    04/21/23 1406 04/22/23 1208 04/23/23 0400  WBC 6.0 5.5 5.8  HGB 6.0* 5.9* 7.2*  HCT 23.8* 23.3* 27.0*  PLT 199 219 207   BMET Recent Labs    04/21/23 1406 04/22/23 1208 04/23/23 0400  NA 136 135 135  K 4.1 3.8 3.6  CL 99 100 102  CO2 24 27 25   GLUCOSE 103* 135* 106*  BUN 21 19 13   CREATININE 1.27 1.02 0.89  CALCIUM 8.8 8.7* 8.4*   LFT Recent Labs    04/21/23 1406 04/22/23 1208  PROT 6.4 6.9  ALBUMIN 3.7 3.2*  AST 17 16  ALT 14 14  ALKPHOS 108 75  BILITOT 0.6 0.8   PT/INR Recent Labs    04/22/23 1208 04/23/23 0400  LABPROT  30.4* 34.1*  INR 2.9* 3.3*    Radiology/Studies CT Head Wo Contrast Result Date: 04/22/2023 CLINICAL DATA:  Mental status change of unknown cause EXAM: CT HEAD WITHOUT CONTRAST TECHNIQUE: Contiguous axial images were obtained from the base of the skull through the vertex without intravenous contrast. RADIATION DOSE REDUCTION: This exam was performed according to the departmental dose-optimization program which includes automated exposure control, adjustment of the mA and/or kV according to patient size and/or use of iterative reconstruction technique. COMPARISON:  04/17/2022 FINDINGS: Brain: Age related volume loss. 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 No sign of acute infarction, mass lesion, hemorrhage, hydrocephalus or extra-axial collection. Vascular:  There is atherosclerotic calcification of the major vessels at the base of the brain. Skull: Negative Sinuses/Orbits: Clear/normal Other: None IMPRESSION: No acute CT finding. Age related volume loss. 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 Electronically Signed   By: Paulina Fusi M.D.   On: 04/22/2023 17:54     Assessment   CALIPH BOROWIAK is a 83 y.o. year old male with multiple comorbidities who presented to the ED with complaints of confusion, poor p.o. intake for the past few months and low hemoglobin in outpatient setting.  Found to have hemoglobin of 5.9 and iron deficiency on admission, GI consulted for further evaluation  Iron deficiency anemia: FOBT positive with severe iron deficiency anemia. Pt had initial episode of severe decline in hgb in October, was referred to the ED and transfused 2 units but discharged with recommendations to have GI follow up which never occurred. At this time, Denies rectal bleeding or melena. Family at bedside states he is not eating well at home, sometimes only taking a few bites of food. Had recent episode of his heart being out of rhythm and that seemed to affect his appetite as well. Patient denied any GI complaints. I recommended we proceed with EGD and Colonoscopy for further evaluation. patient initially declined to undergo any procedures, however, once sons at bedside patient agreed to undergo EGD and colonoscopy though patient appeared to be very irritated due to not eating stating he was going to get up and leave tomorrow if he did not get to eat. Last dose of coumadin was Tuesday morning. For now will continue to monitor blood counts and for overt GI bleeding, patient can have soft diet. Will need to give time for coumadin to washout, endoscopic procedures will likely need to be on hold until Monday.   Plan / Recommendations   Continue with blood transfusion Continue to trend H&H, transfuse for hgb <7 PPI BID Soft diet today Monitor  for overt GI bleeding Plan for EGD and Colonoscopy, potentially Monday for time for coumadin washout     04/23/2023, 9:08 AM  Chelsea L. Jeanmarie Hubert, MSN, APRN, AGNP-C Adult-Gerontology Nurse Practitioner [facility]

## 2023-04-23 NOTE — ED Notes (Signed)
This RN made MD Adefeso aware of the pts heart rate dropped to 41 bpm and was staying between 51-60 bpm and with Afib rhythm noted on the monitor.  MD Adefeso ordered an EKG. This RN completed the EKG and he read it. MD Adefeso verbal endorsed that if the pt stayed below 45 bpm then to contact him.

## 2023-04-24 ENCOUNTER — Other Ambulatory Visit: Payer: Self-pay | Admitting: Internal Medicine

## 2023-04-24 DIAGNOSIS — D500 Iron deficiency anemia secondary to blood loss (chronic): Secondary | ICD-10-CM | POA: Diagnosis not present

## 2023-04-24 DIAGNOSIS — K922 Gastrointestinal hemorrhage, unspecified: Secondary | ICD-10-CM | POA: Diagnosis not present

## 2023-04-24 DIAGNOSIS — D509 Iron deficiency anemia, unspecified: Secondary | ICD-10-CM | POA: Diagnosis not present

## 2023-04-24 DIAGNOSIS — R195 Other fecal abnormalities: Secondary | ICD-10-CM | POA: Diagnosis not present

## 2023-04-24 LAB — GLUCOSE, CAPILLARY
Glucose-Capillary: 123 mg/dL — ABNORMAL HIGH (ref 70–99)
Glucose-Capillary: 141 mg/dL — ABNORMAL HIGH (ref 70–99)
Glucose-Capillary: 96 mg/dL (ref 70–99)
Glucose-Capillary: 161 mg/dL — ABNORMAL HIGH (ref 70–99)

## 2023-04-24 LAB — BPAM RBC
Blood Product Expiration Date: 202503122359
Blood Product Expiration Date: 202503122359
Blood Product Expiration Date: 202503122359
ISSUE DATE / TIME: 202502120932
ISSUE DATE / TIME: 202502111613
ISSUE DATE / TIME: 202502111956
Unit Type and Rh: 5100
Unit Type and Rh: 5100
Unit Type and Rh: 5100

## 2023-04-24 LAB — BASIC METABOLIC PANEL
Anion gap: 9 (ref 5–15)
BUN: 13 mg/dL (ref 8–23)
CO2: 24 mmol/L (ref 22–32)
Calcium: 8.6 mg/dL — ABNORMAL LOW (ref 8.9–10.3)
Chloride: 103 mmol/L (ref 98–111)
Creatinine, Ser: 1.05 mg/dL (ref 0.61–1.24)
GFR, Estimated: >60 mL/min (ref >60)
Glucose, Bld: 115 mg/dL — ABNORMAL HIGH (ref 70–99)
Potassium: 3.5 mmol/L (ref 3.5–5.1)
Sodium: 136 mmol/L (ref 135–145)

## 2023-04-24 LAB — CBC
HCT: 29.7 % — ABNORMAL LOW (ref 39.0–52.0)
Hemoglobin: 8.2 g/dL — ABNORMAL LOW (ref 13.0–17.0)
MCH: 16.9 pg — ABNORMAL LOW (ref 26.0–34.0)
MCHC: 27.6 g/dL — ABNORMAL LOW (ref 30.0–36.0)
MCV: 61.2 fL — ABNORMAL LOW (ref 80.0–100.0)
Platelets: 231 10*3/uL (ref 150–400)
RBC: 4.85 MIL/uL (ref 4.22–5.81)
RDW: 27.9 % — ABNORMAL HIGH (ref 11.5–15.5)
WBC: 6.7 10*3/uL (ref 4.0–10.5)
nRBC: 0.4 % — ABNORMAL HIGH (ref 0.0–0.2)

## 2023-04-24 LAB — TYPE AND SCREEN
ABO/RH(D): O POS
Antibody Screen: NEGATIVE
Unit division: 0
Unit division: 0
Unit division: 0

## 2023-04-24 LAB — PROTIME-INR
INR: 1.8 — ABNORMAL HIGH (ref 0.8–1.2)
Prothrombin Time: 21 s — ABNORMAL HIGH (ref 11.4–15.2)

## 2023-04-24 LAB — PHOSPHORUS: Phosphorus: 2.5 mg/dL (ref 2.5–4.6)

## 2023-04-24 LAB — MAGNESIUM: Magnesium: 2 mg/dL (ref 1.7–2.4)

## 2023-04-24 LAB — TOXASSURE SELECT 13 (MW), URINE

## 2023-04-24 MED ORDER — OXYCODONE HCL 5 MG PO TABS
10.0000 mg | ORAL_TABLET | Freq: Four times a day (QID) | ORAL | Status: DC | PRN
  Administered 2023-04-25: 10 mg via ORAL
  Filled 2023-04-24 (×2): qty 2

## 2023-04-24 MED ORDER — ALLOPURINOL 300 MG PO TABS: 300.0000 mg | ORAL_TABLET | Freq: Every day | ORAL | 6 refills | Status: DC

## 2023-04-24 NOTE — Progress Notes (Signed)
Gastroenterology Progress Note   Referring Provider: No ref. provider found Primary Care Physician:  Anabel Halon, MD Patient ID: Wayne Middleton; [id number]; [birth year]   Subjective:    Patient resting. Son at bedside. Patient states he feels too sick to try and do a bowel prep today. He ate last night and had vomiting afterwards. No emesis today. Consumed 40-50% of meals thus far. Large nonbloody stool last night.   Objective:   Vital signs in last 24 hours: Temp:  [98.2 F (36.8 C)-98.6 F (37 C)] 98.2 F (36.8 C) (02/13 0330) Pulse Rate:  [70-75] 70 (02/13 1440) BP: (104-120)/(56-70) 120/68 (02/13 1440) SpO2:  [94 %-96 %] 94 % (02/13 1440) Last BM Date : 04/24/23 General:   resting but easily arouses. Appears frustrated with questions and discussion about colonoscopy.  Well-developed, well-nourished, NAD Head:  Normocephalic and atraumatic. Eyes:  Sclera clear, no icterus.  Chest: CTA bilaterally without rales, rhonchi, crackles.    Heart:  Regular rate and rhythm; no murmurs, clicks, rubs,  or gallops. Abdomen:  Soft, nontender and nondistended. No masses, hepatosplenomegaly. Normal bowel sounds, without guarding, and without rebound.  ?hernia right of umbilicus Extremities:  Without clubbing, deformity or edema. Neurologic:  Alert and  oriented x4;  grossly normal neurologically. Skin:  Intact without significant lesions or rashes. Psych:  Alert and cooperative. Normal mood and affect.  Intake/Output from previous day: 02/12 0701 - 02/13 0700 In: 853 [Blood:317; IV Piggyback:536] Out: 1300 [Urine:1300] Intake/Output this shift: Total I/O In: 777.9 [P.O.:480; IV Piggyback:297.9] Out: -   Lab Results: CBC Recent Labs    04/22/23 1208 04/23/23 0400 04/24/23 0326  WBC 5.5 5.8 6.7  HGB 5.9* 7.2* 8.2*  HCT 23.3* 27.0* 29.7*  MCV 56.6* 59.5* 61.2*  PLT 219 207 231   BMET Recent Labs    04/22/23 1208 04/23/23 0400 04/24/23 0326  NA 135 135 136  K 3.8 3.6  3.5  CL 100 102 103  CO2 27 25 24   GLUCOSE 135* 106* 115*  BUN 19 13 13   CREATININE 1.02 0.89 1.05  CALCIUM 8.7* 8.4* 8.6*   LFTs Recent Labs    04/22/23 1208  BILITOT 0.8  ALKPHOS 75  AST 16  ALT 14  PROT 6.9  ALBUMIN 3.2*   No results for input(s): "LIPASE" in the last 72 hours. PT/INR Recent Labs    04/22/23 1208 04/23/23 0400 04/24/23 0326  LABPROT 30.4* 34.1* 21.0*  INR 2.9* 3.3* 1.8*         Imaging Studies: CT Head Wo Contrast Result Date: 04/22/2023 CLINICAL DATA:  Mental status change of unknown cause EXAM: CT HEAD WITHOUT CONTRAST TECHNIQUE: Contiguous axial images were obtained from the base of the skull through the vertex without intravenous contrast. RADIATION DOSE REDUCTION: This exam was performed according to the departmental dose-optimization program which includes automated exposure control, adjustment of the mA and/or kV according to patient size and/or use of iterative reconstruction technique. COMPARISON:  04/17/2022 FINDINGS: Brain: Age related volume loss. 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 No sign of acute infarction, mass lesion, hemorrhage, hydrocephalus or extra-axial collection. Vascular: There is atherosclerotic calcification of the major vessels at the base of the brain. Skull: Negative Sinuses/Orbits: Clear/normal Other: None IMPRESSION: No acute CT finding. Age related volume loss. 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 Electronically Signed   By: Paulina Fusi M.D.   On: 04/22/2023 17:54  [2 weeks]  Assessment/Plan:   Wayne Middleton is  a 83 y.o. year old male with multiple comorbidities who presented to the ED with complaints of confusion, poor p.o. intake for the past few months and low hemoglobin in outpatient setting.  Found to have hemoglobin of 5.9 and iron deficiency on admission, GI consulted for further evaluation   IDA/hemoccult positive stool: -required blood transfusion in  12/2022 -received 3 units of prbcs this admission with Hgb going from 5.9 to 8.2 -no overt GI bleeding. Stool is hemoccult positive -history of advanced adenomas (tubulovillous) removed about 14 years ago without subsequent surveillance. Patient had post polypectomy bleed requiring 2 units of prbcs at that time -concern for significant pathology in his colon -plan for colonoscopy/EGD when patient agreeable/able. Currently complains of nausea and feeling too sick to prep -INR 1.8, acceptable range for endoscopic evaluation -continue PPI BID -clear liquid diet until determine if colonoscopy to occur prior to Monday       LOS: 2 days   Leanna Battles. [facility] [phone] 2/13/20254:13 PM

## 2023-04-24 NOTE — Plan of Care (Signed)
  Problem: Education: Goal: Ability to describe self-care measures that may prevent or decrease complications (Diabetes Survival Skills Education) will improve Outcome: Progressing   Problem: Coping: Goal: Ability to adjust to condition or change in health will improve Outcome: Progressing   Problem: Fluid Volume: Goal: Ability to maintain a balanced intake and output will improve Outcome: Progressing   Problem: Health Behavior/Discharge Planning: Goal: Ability to identify and utilize available resources and services will improve Outcome: Progressing Goal: Ability to manage health-related needs will improve Outcome: Progressing   Problem: Metabolic: Goal: Ability to maintain appropriate glucose levels will improve Outcome: Progressing   Problem: Nutritional: Goal: Maintenance of adequate nutrition will improve Outcome: Progressing   Problem: Skin Integrity: Goal: Risk for impaired skin integrity will decrease Outcome: Progressing   Problem: Tissue Perfusion: Goal: Adequacy of tissue perfusion will improve Outcome: Progressing   Problem: Education: Goal: Knowledge of General Education information will improve Description: Including pain rating scale, medication(s)/side effects and non-pharmacologic comfort measures Outcome: Progressing

## 2023-04-24 NOTE — Progress Notes (Signed)
Progress Note   Patient: Wayne Middleton XWR:[id number] DOB: 05/22/1940 DOA: 04/22/2023     2 DOS: the patient was seen and examined on 04/24/2023   Brief hospital admission course: As per H&P written by Dr. Lucianne Muss on 04/22/2023 Wayne Middleton is a 83 y.o. male with Past medical history of chronic A-fib on Coumadin, HTN, diastolic CHF, DM, neuropathy, GERD, chronic pain, depression as reviewed from EMR, presented to [facility] ED with complaining of fatigue, confusion for few months and decreased oral intake.  Patient is not a good historian, developing dementia, patient was brought in by family, patient lives alone.  Patient has anorexia, decreased oral intake and feeling more fatigue and being confused for past few months.  Patient was seen by PCP, hemoglobin was low so patient was sent to ED for further workup and management.    ED Course: VS afebrile, HR 99, RR 18, BP 118/55, 100% on room air FOBT positive CBC Hb 5.9 Transferrin saturation 2% Check B12 and folate level CMP BUN 35, rest within normal range Hemoglobin A1c 6.8 CT head:No acute CT finding. Age related volume loss. 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  Assessment and Plan: 1-GI bleed -With concern for upper GI bleed in the setting of melenic stools, positive fecal occult blood test, hemoglobin of 5.9 at time of admission in a patient with chronic use of Coumadin. -Vitamin K and 2 units PRBCs has been transfused -IV iron also provided -Continue to follow hemoglobin trend -After discussing with GI service plans will be for endoscopy and colonoscopy on 04/28/2023 to allow for complete washout of Coumadin. -Continue supportive care and follow clinical response. -Continue PPI.  2-iron deficiency anemia -IV iron will be provided -Planning endoscopy and colonoscopy as part of workup.  3-chronic A-fib, hypertension and chronic diastolic CHF. -With underlying history of hypertension and diastolic heart failure -Holding  Coumadin in the setting of GI bleed -Continue home antihypertensive regimen using atenolol and Cardizem -Follow blood pressure and adjust antihypertensive agents as needed -Continue to follow daily weights and strict intake and output. -Chronic diastolic heart failure currently compensated.  4-type 2 diabetes with neuropathy -Most recent A1c 6.8 -Continue sliding scale insulin -Resume home dose Lyrica. -Will also continue as needed oxycodone as mentioned below.  5-dementia and history of delirium -Continue Haldol and as needed Seroquel. -Mentation currently stable and adequately following commands.  6-chronic pain/depression -Continue Prozac and as needed oxycodone -Avoid the use of NSAIDs in the setting of acute GI bleed.  Subjective:  Afebrile, no chest pain, no shortness of breath and overall feeling better.  No overt bleeding has been reported. Physical Exam: Vitals:   04/23/23 1342 04/23/23 2003 04/24/23 0330 04/24/23 1440  BP: (!) 98/54 (!) 107/56 104/70 120/68  Pulse: 65 72 75 70  Resp:      Temp: 98 F (36.7 C) 98.6 F (37 C) 98.2 F (36.8 C)   TempSrc: Oral Axillary Oral   SpO2: 97% 96% 95% 94%  Weight:      Height:       General exam: Alert, awake, afebrile and comparative.  Sprays having some nausea. Respiratory system: Clear to auscultation. Respiratory effort normal.  Good saturation on room air. Cardiovascular system:RRR. No rubs or gallops. Gastrointestinal system: Abdomen is nondistended, soft and nontender. No organomegaly or masses felt. Normal bowel sounds heard. Central nervous system: No focal neurological deficits. Extremities: No cyanosis or clubbing. Skin: No petechiae. Psychiatry: Flat affect appreciated on exam.  Data Reviewed: Basic  metabolic panel: Sodium 136, potassium 3.5, chloride 103, bicarb 24, BUN 13, creatinine 1.05 and GFR >60 CBC: WBCs 6.7, hemoglobin 8.2 [place] count 231K Prothrombin time/INR: 21.0/1.8 Magnesium: 2.0  Family  Communication: Son updated at bedside.    Disposition: Status is: Inpatient Remains inpatient appropriate because: Continue treatment for acute GI bleed.   Planned Discharge Destination: Home   Time spent: 50 minutes  Author: Vassie Loll, MD 04/24/2023 7:56 PM  For on call review [email or web address].

## 2023-04-24 NOTE — Telephone Encounter (Signed)
Copied from CRM [id number]. Topic: Clinical - Medication Refill >> Apr 24, 2023  8:21 AM Desma Mcgregor wrote: Most Recent Primary Care Visit:  Provider: Anabel Halon  Department: [facility]  Visit Type: OFFICE VISIT  Date: 04/21/2023  Medication: allopurinol 300mg   Has the patient contacted their pharmacy? Pharmacy call. No Rx on file  Is this the correct pharmacy for this prescription? Yes If no, delete pharmacy and type the correct one.  This is the patient's preferred pharmacy:  [facility] - [place], [place] - [address] [place] [address] Phone: [phone] Fax: [phone]  Has the prescription been filled recently? No  Is the patient out of the medication? No  Has the patient been seen for an appointment in the last year OR does the patient have an upcoming appointment? Yes  Can we respond through MyChart? No  Agent: Please be advised that Rx refills may take up to 3 business days. We ask that you follow-up with your pharmacy.

## 2023-04-25 DIAGNOSIS — D509 Iron deficiency anemia, unspecified: Secondary | ICD-10-CM | POA: Diagnosis not present

## 2023-04-25 DIAGNOSIS — R195 Other fecal abnormalities: Secondary | ICD-10-CM | POA: Diagnosis not present

## 2023-04-25 DIAGNOSIS — K922 Gastrointestinal hemorrhage, unspecified: Secondary | ICD-10-CM | POA: Diagnosis not present

## 2023-04-25 DIAGNOSIS — D500 Iron deficiency anemia secondary to blood loss (chronic): Secondary | ICD-10-CM | POA: Diagnosis not present

## 2023-04-25 LAB — CBC
HCT: 30.7 % — ABNORMAL LOW (ref 39.0–52.0)
Hemoglobin: 8.3 g/dL — ABNORMAL LOW (ref 13.0–17.0)
MCH: 17 pg — ABNORMAL LOW (ref 26.0–34.0)
MCHC: 27 g/dL — ABNORMAL LOW (ref 30.0–36.0)
MCV: 62.8 fL — ABNORMAL LOW (ref 80.0–100.0)
Platelets: 221 10*3/uL (ref 150–400)
RBC: 4.89 MIL/uL (ref 4.22–5.81)
RDW: 28.9 % — ABNORMAL HIGH (ref 11.5–15.5)
WBC: 8.2 10*3/uL (ref 4.0–10.5)
nRBC: 0.4 % — ABNORMAL HIGH (ref 0.0–0.2)

## 2023-04-25 LAB — PROTIME-INR
INR: 1.5 — ABNORMAL HIGH (ref 0.8–1.2)
Prothrombin Time: 17.8 s — ABNORMAL HIGH (ref 11.4–15.2)

## 2023-04-25 LAB — GLUCOSE, CAPILLARY
Glucose-Capillary: 140 mg/dL — ABNORMAL HIGH (ref 70–99)
Glucose-Capillary: 136 mg/dL — ABNORMAL HIGH (ref 70–99)
Glucose-Capillary: 89 mg/dL (ref 70–99)
Glucose-Capillary: 84 mg/dL (ref 70–99)

## 2023-04-25 MED ORDER — PHYTONADIONE 5 MG PO TABS: 2.5000 mg | ORAL_TABLET | Freq: Once | ORAL | Status: DC

## 2023-04-25 MED ORDER — PHYTONADIONE 5 MG PO TABS
5.0000 mg | ORAL_TABLET | Freq: Once | ORAL | Status: AC
Start: 2023-04-25 — End: 2023-04-25
  Administered 2023-04-25: 5 mg via ORAL
  Filled 2023-04-25: qty 1

## 2023-04-25 MED ORDER — OXYCODONE HCL 5 MG PO TABS
5.0000 mg | ORAL_TABLET | Freq: Three times a day (TID) | ORAL | Status: DC | PRN
  Administered 2023-04-25: 5 mg via ORAL
  Administered 2023-04-26: 10 mg via ORAL
  Administered 2023-04-27 (×2): 5 mg via ORAL
  Filled 2023-04-25 (×2): qty 1
  Filled 2023-04-25: qty 2
  Filled 2023-04-25: qty 1

## 2023-04-25 MED ORDER — BISACODYL 5 MG PO TBEC
10.0000 mg | DELAYED_RELEASE_TABLET | Freq: Once | ORAL | Status: AC
  Administered 2023-04-25: 10 mg via ORAL
  Filled 2023-04-25: qty 2

## 2023-04-25 MED ORDER — PEG 3350-KCL-NA BICARB-NACL 420 G PO SOLR
4000.0000 mL | Freq: Once | ORAL | Status: AC
  Administered 2023-04-25: 4000 mL via ORAL

## 2023-04-25 MED ORDER — BISACODYL 5 MG PO TBEC: 10.0000 mg | DELAYED_RELEASE_TABLET | Freq: Once | ORAL | Status: DC

## 2023-04-25 MED ORDER — BISACODYL 5 MG PO TBEC
5.0000 mg | DELAYED_RELEASE_TABLET | Freq: Once | ORAL | Status: AC
Start: 2023-04-25 — End: 2023-04-25
  Administered 2023-04-25: 5 mg via ORAL
  Filled 2023-04-25: qty 1

## 2023-04-25 NOTE — Progress Notes (Addendum)
Agree with the below findings as written. The patient was presented to me by the APP (Advanced Practice Provider) and I have also seen and examined the patient independently. Please see my key portion of the encounter as documented.  This is an 83 year old male with multiple comorbidities including A-fib on Coumadin presented with altered mental status and fatigue.  Patient was admitted with symptomatic anemia hemoglobin 5.8 for which GI was consulted.  Patient had appropriate transfusion response.  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    Patient denies having any blood per rectum or melena.  INR 1.5 today stable hemoglobin.  Patient is agreeable for bidirectional endoscopy tommorow  Wayne Lawman, MD Gastroenterology and Hepatology [facility]      Gastroenterology Progress Note   Referring Provider: No ref. provider found Primary Care Physician:  Wayne Halon, MD  Patient ID: Wayne Middleton; [id number]; 1940-11-16   Subjective   Entered room and observed patient eating his clear liquid tray for lunch.  He has almost finished the whole entire thing.  Denies any nausea or vomiting today.  Denies any bloody stools.  Denies any abdominal pain.  Objective   Vital signs in last 24 hours Temp:  [97.8 F (36.6 C)-98.5 F (36.9 C)] 98.5 F (36.9 C) (02/14 0529) Pulse Rate:  [70-91] 85 (02/14 0745) Resp:  [19] 19 (02/14 0529) BP: (120-125)/(61-88) 120/61 (02/14 0745) SpO2:  [94 %-98 %] 97 % (02/14 0529) Last BM Date : 04/24/23  Physical Exam General:   Alert and oriented, pleasant. Head:  Normocephalic and atraumatic. Eyes:  No icterus, sclera clear. Conjuctiva pink.  Mouth:  Without lesions, mucosa pink and moist.  Abdomen:  Bowel sounds present, soft, non-tender, non-distended. No HSM or hernias noted.  No rebound or guarding. No masses appreciated  Msk:  Symmetrical without gross deformities. Normal posture. Neurologic:  Alert and oriented x4;  grossly normal neurologically. Psych:  Alert and cooperative. Normal mood and affect.  Intake/Output from previous day: 02/13 0701 - 02/14 0700 In: 777.9 [P.O.:480; IV Piggyback:297.9] Out: 1150 [Urine:1150] Intake/Output this shift: Total I/O In: 480 [P.O.:480] Out: -   Lab Results  Recent Labs    04/23/23 0400 04/24/23 0326 04/25/23 0411  WBC 5.8 6.7 8.2  HGB 7.2* 8.2* 8.3*  HCT 27.0* 29.7* 30.7*  PLT 207 231 221   BMET Recent Labs    04/23/23 0400 04/24/23 0326  NA 135 136  K 3.6 3.5  CL 102 103  CO2 25 24  GLUCOSE 106* 115*  BUN 13 13  CREATININE 0.89 1.05  CALCIUM 8.4* 8.6*   LFT No results for input(s): "PROT", "ALBUMIN", "AST", "ALT", "ALKPHOS", "BILITOT", "BILIDIR", "IBILI" in the last 72 hours. PT/INR Recent Labs    04/24/23 0326 04/25/23 0411  LABPROT 21.0* 17.8*  INR 1.8* 1.5*   Hepatitis Panel No results for input(s): "HEPBSAG", "HCVAB", "HEPAIGM", "HEPBIGM" in the last 72 hours.  Studies/Results CT Head Wo Contrast Result Date: 04/22/2023 CLINICAL DATA:  Mental status change of unknown cause EXAM: CT HEAD WITHOUT CONTRAST TECHNIQUE: Contiguous axial images were obtained from the base of the skull through the vertex without intravenous contrast. RADIATION DOSE REDUCTION: This exam was performed according to the departmental dose-optimization program which includes automated exposure control, adjustment of the mA and/or kV according to patient size and/or use of iterative reconstruction technique.  COMPARISON:  04/17/2022 FINDINGS: Brain: Age related volume loss. 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 No sign of acute infarction, mass lesion, hemorrhage, hydrocephalus or extra-axial collection. Vascular: There is atherosclerotic calcification of the major vessels at the base of the brain.  Skull: Negative Sinuses/Orbits: Clear/normal Other: None IMPRESSION: No acute CT finding. Age related volume loss. 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 Electronically Signed   By: Wayne Middleton M.D.   On: 04/22/2023 17:54    Assessment  83 y.o. male with a history of multiple comorbidities including diastolic CHF, A-fib on Coumadin, HTN, diabetes, neuropathy, GERD, chronic pain, and depression who presented to the ED with complaints of confusion, poor p.o. intake for months, and low hemoglobin in the outpatient setting.  GI consulted for further evaluation given significant anemia with iron deficiency.  IDA, heme occult + stool:  -Required blood transfusion for anemia in October 2024 -Has received multiple units this admission as hemoglobin was 5.9. Hemoglobin has improved to 8.3 today and has been stable since transfusion. -Reported history of post polypectomy bleed about 14 years prior.  Has not had any further surveillance. -Concern for significant pathology within his colon as cause of anemia. -On Coumadin chronically outpatient, been waiting for washout.  INR down to 1.5 today. -Stable enough for procedures tomorrow, patient agreeable to prep today. -Denies any passage of dark or bloody stools in the last 24 hours.  Patient aware he needs to be n.p.o. after midnight, will continue clear liquids throughout the day today along with his colon prep.  I have discussed the risks, alternatives, benefits with regards to but not limited to the risk of reaction to medication, bleeding, infection, perforation and the patient is agreeable to proceed. Written consent to be obtained.  Plan / Recommendations  CBC,INR tomorrow Clear liquids NPO midnight Colonoscopy prep this afternoon EGD and colonoscopy tomorrow with Dr. Tasia Catchings (patient agreeable)   Addendum: Reach out to patient's [name] and discussed procedure with him including risk, benefits, and plan tomorrow.  He  is agreeable with patient undergoing upper endoscopy and colonoscopy tomorrow with Dr. Tasia Catchings.  Nursing will verify and his name will be placed on consent form.   LOS: 3 days    04/25/2023, 1:26 PM   Wayne Bonito, MSN, FNP-BC, AGACNP-BC [facility]

## 2023-04-25 NOTE — Care Management Important Message (Signed)
Important Message  Patient Details  Name: Wayne Middleton MRN: [id number] Date of Birth: 08/29/1940   Important Message Given:  Yes - Medicare IM     Corey Harold 04/25/2023, 11:43 AM

## 2023-04-25 NOTE — Progress Notes (Signed)
Progress Note   Patient: Wayne Middleton:[id number] DOB: 1941-03-11 DOA: 04/22/2023     3 DOS: the patient was seen and examined on 04/25/2023   Brief hospital admission course: As per H&P written by Dr. Lucianne Muss on 04/22/2023 Wayne Middleton is a 83 y.o. male with Past medical history of chronic A-fib on Coumadin, HTN, diastolic CHF, DM, neuropathy, GERD, chronic pain, depression as reviewed from EMR, presented to [facility] ED with complaining of fatigue, confusion for few months and decreased oral intake.  Patient is not a good historian, developing dementia, patient was brought in by family, patient lives alone.  Patient has anorexia, decreased oral intake and feeling more fatigue and being confused for past few months.  Patient was seen by PCP, hemoglobin was low so patient was sent to ED for further workup and management.    ED Course: VS afebrile, HR 99, RR 18, BP 118/55, 100% on room air FOBT positive CBC Hb 5.9 Transferrin saturation 2% Check B12 and folate level CMP BUN 35, rest within normal range Hemoglobin A1c 6.8 CT head:No acute CT finding. Age related volume loss. 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  Assessment and Plan: 1-GI bleed -With concern for upper GI bleed in the setting of melenic stools, positive fecal occult blood test, hemoglobin of 5.9 at time of admission in a patient with chronic use of Coumadin. -Vitamin K and 2 units PRBCs has been transfused -IV iron also provided -Hemoglobin currently 8.3. -No overt bleeding reported. -Continue to follow hemoglobin trend -After discussing with GI service and with a stable INR level patient will receive bowel preparation with intention for endoscopy and colonoscopy on 04/26/2023. -Continue supportive care and follow clinical response. -Continue PPI. -Continue to hold anticoagulation until clear by GI service.  2-iron deficiency anemia -IV iron provided -Planning endoscopy and colonoscopy as part of  workup.  3-chronic A-fib, hypertension and chronic diastolic CHF. -With underlying history of hypertension and diastolic heart failure -Holding Coumadin in the setting of GI bleed -Continue home antihypertensive regimen using atenolol and Cardizem -Follow blood pressure and adjust antihypertensive agents as needed -Continue to follow daily weights and strict intake and output. -Chronic diastolic heart failure currently compensated.  4-type 2 diabetes with neuropathy -Most recent A1c 6.8 -Continue sliding scale insulin -Resume home dose Lyrica. -Will also continue as needed oxycodone as mentioned below.  5-dementia and history of delirium -Continue Haldol and as needed Seroquel. -Mentation currently stable and adequately following commands.  6-chronic pain/depression -Continue Prozac and as needed oxycodone -Avoid the use of NSAIDs in the setting of acute GI bleed.  Subjective:  Afebrile, no chest pain, no shortness of breath, and without reports for upper bleeding overnight.  Hemodynamically stable.  Complaining of right lower leg pain.  Physical Exam: Vitals:   04/24/23 2011 04/25/23 0529 04/25/23 0745 04/25/23 1341  BP: 125/88 125/64 120/61 105/62  Pulse: 81 91 85 78  Resp:  19    Temp: 97.8 F (36.6 C) 98.5 F (36.9 C)  97.9 F (36.6 C)  TempSrc: Oral Oral  Oral  SpO2: 98% 97%  100%  Weight:      Height:       General exam: Alert, awake and following commands; is present having pain in his right lower extremity.  No overt bleeding reported. Respiratory system: Clear to auscultation. Respiratory effort normal. Cardiovascular system:RRR. No murmurs, rubs, gallops. Gastrointestinal system: Abdomen is nondistended, soft and nontender. No organomegaly or masses felt. Normal bowel sounds heard.  Central nervous system:  No focal neurological deficits. Extremities: No cyanosis or clubbing. Skin: No petechiae. Psychiatry: Flat affect appreciated.   Data Reviewed: CBC:  WBCs 8.2, hemoglobin 8.3, platelet count 221K INR: 1.5; prothrombin time 17.8  Family Communication: Son updated at bedside.    Disposition: Status is: Inpatient Remains inpatient appropriate because: Continue treatment for acute GI bleed.   Planned Discharge Destination: Home    Time spent: 50 minutes  Author: Vassie Loll, MD 04/25/2023 6:30 PM  For on call review [email or web address].

## 2023-04-25 NOTE — H&P (View-Only) (Signed)
 Agree with the below findings as written. The patient was presented to me by the APP (Advanced Practice Provider) and I have also seen and examined the patient independently. Please see my key portion of the encounter as documented.  This is an 83 year old male with multiple comorbidities including A-fib on Coumadin presented with altered mental status and fatigue.  Patient was admitted with symptomatic anemia hemoglobin 5.8 for which GI was consulted.  Patient had appropriate transfusion response.  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    Patient denies having any blood per rectum or melena.  INR 1.5 today stable hemoglobin.  Patient is agreeable for bidirectional endoscopy tommorow  Vista Lawman, MD Gastroenterology and Hepatology [facility]      Gastroenterology Progress Note   Referring Provider: No ref. provider found Primary Care Physician:  Anabel Halon, MD  Patient ID: Wayne Middleton; [id number]; 1940-11-16   Subjective   Entered room and observed patient eating his clear liquid tray for lunch.  He has almost finished the whole entire thing.  Denies any nausea or vomiting today.  Denies any bloody stools.  Denies any abdominal pain.  Objective   Vital signs in last 24 hours Temp:  [97.8 F (36.6 C)-98.5 F (36.9 C)] 98.5 F (36.9 C) (02/14 0529) Pulse Rate:  [70-91] 85 (02/14 0745) Resp:  [19] 19 (02/14 0529) BP: (120-125)/(61-88) 120/61 (02/14 0745) SpO2:  [94 %-98 %] 97 % (02/14 0529) Last BM Date : 04/24/23  Physical Exam General:   Alert and oriented, pleasant. Head:  Normocephalic and atraumatic. Eyes:  No icterus, sclera clear. Conjuctiva pink.  Mouth:  Without lesions, mucosa pink and moist.  Abdomen:  Bowel sounds present, soft, non-tender, non-distended. No HSM or hernias noted.  No rebound or guarding. No masses appreciated  Msk:  Symmetrical without gross deformities. Normal posture. Neurologic:  Alert and oriented x4;  grossly normal neurologically. Psych:  Alert and cooperative. Normal mood and affect.  Intake/Output from previous day: 02/13 0701 - 02/14 0700 In: 777.9 [P.O.:480; IV Piggyback:297.9] Out: 1150 [Urine:1150] Intake/Output this shift: Total I/O In: 480 [P.O.:480] Out: -   Lab Results  Recent Labs    04/23/23 0400 04/24/23 0326 04/25/23 0411  WBC 5.8 6.7 8.2  HGB 7.2* 8.2* 8.3*  HCT 27.0* 29.7* 30.7*  PLT 207 231 221   BMET Recent Labs    04/23/23 0400 04/24/23 0326  NA 135 136  K 3.6 3.5  CL 102 103  CO2 25 24  GLUCOSE 106* 115*  BUN 13 13  CREATININE 0.89 1.05  CALCIUM 8.4* 8.6*   LFT No results for input(s): "PROT", "ALBUMIN", "AST", "ALT", "ALKPHOS", "BILITOT", "BILIDIR", "IBILI" in the last 72 hours. PT/INR Recent Labs    04/24/23 0326 04/25/23 0411  LABPROT 21.0* 17.8*  INR 1.8* 1.5*   Hepatitis Panel No results for input(s): "HEPBSAG", "HCVAB", "HEPAIGM", "HEPBIGM" in the last 72 hours.  Studies/Results CT Head Wo Contrast Result Date: 04/22/2023 CLINICAL DATA:  Mental status change of unknown cause EXAM: CT HEAD WITHOUT CONTRAST TECHNIQUE: Contiguous axial images were obtained from the base of the skull through the vertex without intravenous contrast. RADIATION DOSE REDUCTION: This exam was performed according to the departmental dose-optimization program which includes automated exposure control, adjustment of the mA and/or kV according to patient size and/or use of iterative reconstruction technique.  COMPARISON:  04/17/2022 FINDINGS: Brain: Age related volume loss. 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 No sign of acute infarction, mass lesion, hemorrhage, hydrocephalus or extra-axial collection. Vascular: There is atherosclerotic calcification of the major vessels at the base of the brain.  Skull: Negative Sinuses/Orbits: Clear/normal Other: None IMPRESSION: No acute CT finding. Age related volume loss. 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 Electronically Signed   By: Paulina Fusi M.D.   On: 04/22/2023 17:54    Assessment  83 y.o. male with a history of multiple comorbidities including diastolic CHF, A-fib on Coumadin, HTN, diabetes, neuropathy, GERD, chronic pain, and depression who presented to the ED with complaints of confusion, poor p.o. intake for months, and low hemoglobin in the outpatient setting.  GI consulted for further evaluation given significant anemia with iron deficiency.  IDA, heme occult + stool:  -Required blood transfusion for anemia in October 2024 -Has received multiple units this admission as hemoglobin was 5.9. Hemoglobin has improved to 8.3 today and has been stable since transfusion. -Reported history of post polypectomy bleed about 14 years prior.  Has not had any further surveillance. -Concern for significant pathology within his colon as cause of anemia. -On Coumadin chronically outpatient, been waiting for washout.  INR down to 1.5 today. -Stable enough for procedures tomorrow, patient agreeable to prep today. -Denies any passage of dark or bloody stools in the last 24 hours.  Patient aware he needs to be n.p.o. after midnight, will continue clear liquids throughout the day today along with his colon prep.  I have discussed the risks, alternatives, benefits with regards to but not limited to the risk of reaction to medication, bleeding, infection, perforation and the patient is agreeable to proceed. Written consent to be obtained.  Plan / Recommendations  CBC,INR tomorrow Clear liquids NPO midnight Colonoscopy prep this afternoon EGD and colonoscopy tomorrow with Dr. Tasia Catchings (patient agreeable)   Addendum: Reach out to patient's son [name] and discussed procedure with him including risk, benefits, and plan tomorrow.  He  is agreeable with patient undergoing upper endoscopy and colonoscopy tomorrow with Dr. Tasia Catchings.  Nursing will verify and his name will be placed on consent form.   LOS: 3 days    04/25/2023, 1:26 PM   Brooke Bonito, MSN, FNP-BC, AGACNP-BC [facility]

## 2023-04-25 NOTE — Plan of Care (Signed)
  Problem: Skin Integrity: Goal: Risk for impaired skin integrity will decrease Outcome: Progressing   Problem: Nutritional: Goal: Maintenance of adequate nutrition will improve Outcome: Progressing   Problem: Health Behavior/Discharge Planning: Goal: Ability to manage health-related needs will improve Outcome: Progressing

## 2023-04-26 ENCOUNTER — Inpatient Hospital Stay (HOSPITAL_COMMUNITY): Payer: Medicare HMO | Admitting: Anesthesiology

## 2023-04-26 ENCOUNTER — Encounter (HOSPITAL_COMMUNITY)
Admission: EM | Disposition: A | Discharge status: Home Health | Payer: Self-pay | Source: Home / Self Care | Attending: Internal Medicine

## 2023-04-26 DIAGNOSIS — K922 Gastrointestinal hemorrhage, unspecified: Secondary | ICD-10-CM | POA: Diagnosis not present

## 2023-04-26 DIAGNOSIS — K317 Polyp of stomach and duodenum: Secondary | ICD-10-CM

## 2023-04-26 DIAGNOSIS — K3189 Other diseases of stomach and duodenum: Secondary | ICD-10-CM | POA: Diagnosis not present

## 2023-04-26 DIAGNOSIS — D123 Benign neoplasm of transverse colon: Secondary | ICD-10-CM

## 2023-04-26 DIAGNOSIS — D122 Benign neoplasm of ascending colon: Secondary | ICD-10-CM

## 2023-04-26 DIAGNOSIS — D120 Benign neoplasm of cecum: Secondary | ICD-10-CM

## 2023-04-26 DIAGNOSIS — K2970 Gastritis, unspecified, without bleeding: Secondary | ICD-10-CM

## 2023-04-26 DIAGNOSIS — D509 Iron deficiency anemia, unspecified: Secondary | ICD-10-CM | POA: Diagnosis not present

## 2023-04-26 DIAGNOSIS — C183 Malignant neoplasm of hepatic flexure: Secondary | ICD-10-CM

## 2023-04-26 DIAGNOSIS — K5669 Other partial intestinal obstruction: Secondary | ICD-10-CM | POA: Diagnosis not present

## 2023-04-26 DIAGNOSIS — K6389 Other specified diseases of intestine: Secondary | ICD-10-CM

## 2023-04-26 DIAGNOSIS — K5730 Diverticulosis of large intestine without perforation or abscess without bleeding: Secondary | ICD-10-CM

## 2023-04-26 DIAGNOSIS — D500 Iron deficiency anemia secondary to blood loss (chronic): Secondary | ICD-10-CM | POA: Diagnosis not present

## 2023-04-26 DIAGNOSIS — K2950 Unspecified chronic gastritis without bleeding: Secondary | ICD-10-CM | POA: Diagnosis not present

## 2023-04-26 DIAGNOSIS — K648 Other hemorrhoids: Secondary | ICD-10-CM

## 2023-04-26 HISTORY — PX: COLONOSCOPY WITH PROPOFOL: SHX5780

## 2023-04-26 HISTORY — PX: ESOPHAGOGASTRODUODENOSCOPY (EGD) WITH PROPOFOL: SHX5813

## 2023-04-26 HISTORY — PX: POLYPECTOMY: SHX5525

## 2023-04-26 HISTORY — PX: BIOPSY: SHX5522

## 2023-04-26 LAB — CBC
HCT: 33 % — ABNORMAL LOW (ref 39.0–52.0)
Hemoglobin: 9 g/dL — ABNORMAL LOW (ref 13.0–17.0)
MCH: 17.5 pg — ABNORMAL LOW (ref 26.0–34.0)
MCHC: 27.3 g/dL — ABNORMAL LOW (ref 30.0–36.0)
MCV: 64.2 fL — ABNORMAL LOW (ref 80.0–100.0)
Platelets: 244 10*3/uL (ref 150–400)
RBC: 5.14 MIL/uL (ref 4.22–5.81)
RDW: 30.6 % — ABNORMAL HIGH (ref 11.5–15.5)
WBC: 6.7 10*3/uL (ref 4.0–10.5)
nRBC: 0 % (ref 0.0–0.2)

## 2023-04-26 LAB — GLUCOSE, CAPILLARY
Glucose-Capillary: 113 mg/dL — ABNORMAL HIGH (ref 70–99)
Glucose-Capillary: 99 mg/dL (ref 70–99)
Glucose-Capillary: 111 mg/dL — ABNORMAL HIGH (ref 70–99)
Glucose-Capillary: 141 mg/dL — ABNORMAL HIGH (ref 70–99)
Glucose-Capillary: 142 mg/dL — ABNORMAL HIGH (ref 70–99)

## 2023-04-26 LAB — PROTIME-INR
INR: 1.3 — ABNORMAL HIGH (ref 0.8–1.2)
Prothrombin Time: 16.7 s — ABNORMAL HIGH (ref 11.4–15.2)

## 2023-04-26 SURGERY — ESOPHAGOGASTRODUODENOSCOPY (EGD) WITH PROPOFOL
Anesthesia: General

## 2023-04-26 MED ORDER — SODIUM CHLORIDE FLUSH 0.9 % IV SOLN
INTRAVENOUS | Status: AC
  Filled 2023-04-26: qty 10

## 2023-04-26 MED ORDER — SPOT INK MARKER SYRINGE KIT
PACK | SUBMUCOSAL | Status: AC
  Filled 2023-04-26: qty 5

## 2023-04-26 MED ORDER — SPOT INK MARKER SYRINGE KIT
PACK | SUBMUCOSAL | Status: DC | PRN
  Administered 2023-04-26: 6 mL via SUBMUCOSAL

## 2023-04-26 MED ORDER — PROPOFOL 10 MG/ML IV BOLUS
INTRAVENOUS | Status: AC
  Filled 2023-04-26: qty 20

## 2023-04-26 MED ORDER — PROPOFOL 500 MG/50ML IV EMUL
INTRAVENOUS | Status: AC
  Filled 2023-04-26: qty 50

## 2023-04-26 MED ORDER — LIDOCAINE HCL (PF) 2 % IJ SOLN
INTRAMUSCULAR | Status: AC
  Filled 2023-04-26: qty 5

## 2023-04-26 MED ORDER — LIDOCAINE HCL (CARDIAC) PF 100 MG/5ML IV SOSY
PREFILLED_SYRINGE | INTRAVENOUS | Status: DC | PRN
  Administered 2023-04-26: 50 mg via INTRAVENOUS

## 2023-04-26 MED ORDER — LACTATED RINGERS IV SOLN
INTRAVENOUS | Status: DC | PRN
Start: 2023-04-26 — End: 2023-04-26

## 2023-04-26 MED ORDER — PROPOFOL 500 MG/50ML IV EMUL
INTRAVENOUS | Status: DC | PRN
  Administered 2023-04-26: 125 ug/kg/min via INTRAVENOUS

## 2023-04-26 NOTE — Op Note (Signed)
[facility] Patient Name: Wayne Middleton Procedure Date: 04/26/2023 10:57 AM MRN: [id number] Date of Birth: 10/04/1940 Attending MD: Sanjuan Dame , MD, [id number] CSN: [id number] Age: 83 Admit Type: Inpatient Procedure:                Colonoscopy Indications:              Melena, Acute post hemorrhagic anemia, Unexplained                            iron deficiency anemia Providers:                Sanjuan Dame, MD, Nena Polio, RN, Cyril Mourning, Technician Referring MD:              Medicines:                Monitored Anesthesia Care Complications:            No immediate complications. Estimated Blood Loss:     Estimated blood loss: none. Estimated blood loss                            was minimal. Procedure:                Pre-Anesthesia Assessment:                           - Prior to the procedure, a History and Physical                            was performed, and patient medications and                            allergies were reviewed. The patient's tolerance of                            previous anesthesia was also reviewed. The risks                            and benefits of the procedure and the sedation                            options and risks were discussed with the patient.                            All questions were answered, and informed consent                            was obtained. Prior Anticoagulants: The patient has                            taken Eliquis (apixaban), last dose was 3 days                            prior to  procedure. ASA Grade Assessment: III - A                            patient with severe systemic disease. After                            reviewing the risks and benefits, the patient was                            deemed in satisfactory condition to undergo the                            procedure.                           After obtaining informed consent, the colonoscope                             was passed under direct vision. Throughout the                            procedure, the patient's blood pressure, pulse, and                            oxygen saturations were monitored continuously. The                            [id number]) scope was introduced through the                            anus and advanced to the the cecum, identified by                            appendiceal orifice and ileocecal valve. The                            colonoscopy was performed without difficulty. The                            patient tolerated the procedure well. The quality                            of the bowel preparation was evaluated using the                            BBPS [facility] Bowel Preparation Scale) with scores                            of: Right Colon = 2 (minor amount of residual                            staining, small fragments of stool and/or opaque  liquid, but mucosa seen well), Transverse Colon = 3                            (entire mucosa seen well with no residual staining,                            small fragments of stool or opaque liquid) and Left                            Colon = 3 (entire mucosa seen well with no residual                            staining, small fragments of stool or opaque                            liquid). The total BBPS score equals 8. The                            ileocecal valve, appendiceal orifice, and rectum                            were photographed. Scope In: 12:02:12 PM Scope Out: 12:33:49 PM Scope Withdrawal Time: 0 hours 24 minutes 50 seconds  Total Procedure Duration: 0 hours 31 minutes 37 seconds  Findings:      A fungating, infiltrative, polypoid and ulcerated partially obstructing       large mass was found at the hepatic flexure. The mass was       circumferential. Oozing was present. This was biopsied with a cold jumbo       forceps for histology. Area was tattooed with an  injection of 4 mL of       Spot (carbon black).      Multiple sessile and semi-pedunculated polyps were found in the       transverse colon, ascending colon and cecum. The polyps were large in       size. Polypectomy was not attempted due to polyp size (too large to be       excised) and the identification of cancer.      Scattered diverticula were found in the left colon.      Non-bleeding external and internal hemorrhoids were found during       retroflexion. The hemorrhoids were small. 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 Biopsied. Tattooed 2-3 cm distally                            . This mass was found 70 cm from anal verge                           - Multiple large polyps in the transverse colon, in  the ascending colon and in the cecum. Resection not                            attempted.                           -There were large amount of large size polyps found                            from cecum to 60 cm from anal verge which were not                            removed . Two seperate areas were tatooed, proximal                            one is 2-3 cm from the mass and distal tatoo is                            demarcation point where beyond that no polyps were                            seen and the colon was clean . Between the two                            tatooe site large colon polyp burden found ; not                            removed , also cecum with large colon polyp burden                            not removed .                           - Diverticulosis in the left colon.                           - Non-bleeding external and internal hemorrhoids. Moderate Sedation:      Per Anesthesia Care Recommendation:           Follow up Path                           Cause of Iron deficiency anemia colon mass                           Surgical evaluation ; most  likley for evaluation of                            extended right hemicolectomy given large polyp                            burden not removed as outlined above  Oncology evaluation Procedure Code(s):        --- Professional ---                           [phone], Colonoscopy, flexible; with biopsy, single                            or multiple                           45381, Colonoscopy, flexible; with directed                            submucosal injection(s), any substance Diagnosis Code(s):        --- Professional ---                           C18.3, Malignant neoplasm of hepatic flexure                           K56.690, Other partial intestinal obstruction                           D12.3, Benign neoplasm of transverse colon (hepatic                            flexure or splenic flexure)                           D12.2, Benign neoplasm of ascending colon                           D12.0, Benign neoplasm of cecum                           K64.8, Other hemorrhoids                           K92.1, Melena (includes Hematochezia)                           D62, Acute posthemorrhagic anemia                           D50.9, Iron deficiency anemia, unspecified                           K57.30, Diverticulosis of large intestine without                            perforation or abscess without bleeding CPT copyright 2022 American Medical Association. All rights reserved. The codes documented in this report are preliminary and upon coder review may  be revised to meet current compliance requirements. Sanjuan Dame, MD Sanjuan Dame, MD 04/26/2023 12:48:23 PM This report has been signed electronically. Number of Addenda: 0

## 2023-04-26 NOTE — Op Note (Signed)
[facility] Patient Name: Wayne Middleton Procedure Date: 04/26/2023 10:58 AM MRN: [id number] Date of Birth: 1941/01/01 Attending MD: Sanjuan Dame , MD, [id number] CSN: [id number] Age: 83 Admit Type: Inpatient Procedure:                Upper GI endoscopy Indications:              Acute post hemorrhagic anemia, Unexplained iron                            deficiency anemia, Melena Providers:                Sanjuan Dame, MD, Nena Polio, RN, Cyril Mourning, Technician Referring MD:              Medicines:                Monitored Anesthesia Care Complications:            No immediate complications. Estimated Blood Loss:     Estimated blood loss was minimal. Procedure:                Pre-Anesthesia Assessment:                           - Prior to the procedure, a History and Physical                            was performed, and patient medications and                            allergies were reviewed. The patient's tolerance of                            previous anesthesia was also reviewed. The risks                            and benefits of the procedure and the sedation                            options and risks were discussed with the patient.                            All questions were answered, and informed consent                            was obtained. Prior Anticoagulants: The patient has                            taken Eliquis (apixaban), last dose was 3 days                            prior to procedure. ASA Grade Assessment: III - A  patient with severe systemic disease. After                            reviewing the risks and benefits, the patient was                            deemed in satisfactory condition to undergo the                            procedure.                           After obtaining informed consent, the endoscope was                            passed under direct vision. Throughout  the                            procedure, the patient's blood pressure, pulse, and                            oxygen saturations were monitored continuously. The                            [id number] ([id number]) scope was introduced through the                            mouth, and advanced to the second part of duodenum.                            The upper GI endoscopy was accomplished without                            difficulty. The patient tolerated the procedure                            well. Scope In: 11:43:04 AM Scope Out: 11:52:38 AM Total Procedure Duration: 0 hours 9 minutes 34 seconds  Findings:      The examined esophagus was normal.      A single 7 mm sessile polyp with no stigmata of recent bleeding was       found in the gastric body. The polyp was removed with a cold snare.       Resection and retrieval were complete. For hemostasis, one hemostatic       clip was successfully placed (MR conditional). Clip manufacturer: [employer]. There was no bleeding at the end of the procedure.      Mild inflammation characterized by erythema was found in the entire       examined stomach. Biopsies were taken with a cold forceps for histology.      The duodenal bulb and second portion of the duodenum were normal.      There is no endoscopic evidence of bleeding in the entire examined       stomach. Impression:               -  Normal esophagus.                           - A single gastric polyp. Resected and retrieved.                            Clip (MR conditional) was placed. Clip                            manufacturer: [employer].                           - Gastritis. Biopsied.                           - Normal duodenal bulb and second portion of the                            duodenum. Moderate Sedation:      Per Anesthesia Care Recommendation:           PPI daily                           Follow up path                           Proceed with colonoscopy  immediatly Procedure Code(s):        --- Professional ---                           [phone], Esophagogastroduodenoscopy, flexible,                            transoral; with removal of tumor(s), polyp(s), or                            other lesion(s) by snare technique                           43239, 59, Esophagogastroduodenoscopy, flexible,                            transoral; with biopsy, single or multiple Diagnosis Code(s):        --- Professional ---                           K31.7, Polyp of stomach and duodenum                           K29.70, Gastritis, unspecified, without bleeding                           D62, Acute posthemorrhagic anemia                           D50.9, Iron deficiency anemia, unspecified  K92.1, Melena (includes Hematochezia) CPT copyright 2022 American Medical Association. All rights reserved. The codes documented in this report are preliminary and upon coder review may  be revised to meet current compliance requirements. Sanjuan Dame, MD Sanjuan Dame, MD 04/26/2023 11:56:59 AM This report has been signed electronically. Number of Addenda: 0

## 2023-04-26 NOTE — Anesthesia Postprocedure Evaluation (Signed)
Anesthesia Post Note  Patient: MD SMOLA  Procedure(s) Performed: ESOPHAGOGASTRODUODENOSCOPY (EGD) WITH PROPOFOL COLONOSCOPY WITH PROPOFOL  Patient location during evaluation: PACU Anesthesia Type: General Level of consciousness: awake and alert Pain management: pain level controlled Vital Signs Assessment: post-procedure vital signs reviewed and stable Respiratory status: spontaneous breathing, nonlabored ventilation and respiratory function stable Cardiovascular status: blood pressure returned to baseline and stable Postop Assessment: no apparent nausea or vomiting Anesthetic complications: no  No notable events documented.   Last Vitals:  Vitals:   04/26/23 1300 04/26/23 1320  BP: 128/66 124/69  Pulse: 77 76  Resp: (!) 23   Temp:  36.9 C  SpO2: 97% 97%    Last Pain:  Vitals:   04/26/23 1320  TempSrc: Oral  PainSc:                  Roslynn Amble

## 2023-04-26 NOTE — Progress Notes (Signed)
Pt has only been able to tolerate 1/2 of the Golytely prep.  PRN Zofran given.  1st tap water enema given and returned very watery, brown stool with sediment.  Not quite clear, but better than previous Bms.   Hilton Sinclair BSN RN CMSRN 04/26/2023, 6:42 AM

## 2023-04-26 NOTE — Progress Notes (Signed)
Progress Note   Patient: Wayne Middleton ZOX:[id number] DOB: [birth year] DOA: 04/22/2023     4 DOS: the patient was seen and examined on 04/26/2023   Brief hospital admission course: As per H&P written by Dr. Lucianne Muss on 04/22/2023 Wayne Middleton is a 83 y.o. male with Past medical history of chronic A-fib on Coumadin, HTN, diastolic CHF, DM, neuropathy, GERD, chronic pain, depression as reviewed from EMR, presented to [facility] ED with complaining of fatigue, confusion for few months and decreased oral intake.  Patient is not a good historian, developing dementia, patient was brought in by family, patient lives alone.  Patient has anorexia, decreased oral intake and feeling more fatigue and being confused for past few months.  Patient was seen by PCP, hemoglobin was low so patient was sent to ED for further workup and management.    ED Course: VS afebrile, HR 99, RR 18, BP 118/55, 100% on room air FOBT positive CBC Hb 5.9 Transferrin saturation 2% Check B12 and folate level CMP BUN 35, rest within normal range Hemoglobin A1c 6.8 CT head:No acute CT finding. Age related volume loss. 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  Assessment and Plan: 1-GI bleed -With concern for upper GI bleed in the setting of melenic stools, positive fecal occult blood test, hemoglobin of 5.9 at time of admission in a patient with chronic use of Coumadin. -Vitamin K and 2 units PRBCs has been transfused -IV iron also provided -Hemoglobin currently 9.0. -No overt bleeding reported. -Continue to follow hemoglobin trend -After discussing with GI service and with a stable INR level plan is for endoscopy and colonoscopy later today. -Appreciate assistance and recommendation by GI and will follow further recommendation. -Continue supportive care and follow clinical response. -Continue PPI. -Continue to hold anticoagulation until clear by GI service for resumption.  2-iron deficiency anemia -IV iron  provided -Planning endoscopy and colonoscopy as part of workup.  3-chronic A-fib, hypertension and chronic diastolic CHF. -With underlying history of hypertension and diastolic heart failure -Holding Coumadin in the setting of GI bleed -Continue home antihypertensive regimen using atenolol and Cardizem -Follow blood pressure and adjust antihypertensive agents as needed -Continue to follow daily weights and strict intake and output. -Chronic diastolic heart failure currently compensated.  4-type 2 diabetes with neuropathy -Most recent A1c 6.8 -Continue sliding scale insulin -Resume home dose Lyrica. -Will also continue as needed oxycodone as mentioned below.  5-dementia and history of delirium -Continue Haldol and as needed Seroquel. -Mentation currently stable and adequately following commands.  6-chronic pain/depression -Continue Prozac and as needed oxycodone -Avoid the use of NSAIDs in the setting of acute GI bleed.  Subjective:  No fever, no chest pain, no shortness of breath.  Reports being hungry and ready for procedures.  Physical Exam: Vitals:   04/26/23 0754 04/26/23 1245 04/26/23 1300 04/26/23 1320  BP: 112/67 116/61 128/66 124/69  Pulse: 90 79 77 76  Resp:  19 (!) 23   Temp:  98.3 F (36.8 C)  98.4 F (36.9 C)  TempSrc:    Oral  SpO2:  100% 97% 97%  Weight:      Height:       General exam: Alert, awake, following commands appropriately and in no acute distress. Respiratory system: Good saturation on room air.  Respiratory effort normal. Cardiovascular system: Rate controlled, no rubs, no gallops.  Telemetry demonstrating chronic A-fib. Gastrointestinal system: Abdomen is nondistended, soft and nontender.  Positive bowel sounds. Central nervous system:No focal neurological deficits.  Extremities: No cyanosis or clubbing. Skin: No petechiae. Psychiatry: No hallucinations, flat affect.   Data Reviewed: CBC: WBCs 6.7, hemoglobin 9.0 and platelet count 2  44K Protime-INR: 16.7 and 1.3 respectively  Family Communication: Son updated at bedside.    Disposition: Status is: Inpatient Remains inpatient appropriate because: Continue treatment for acute GI bleed.   Planned Discharge Destination: Home    Time spent: 50 minutes  Author: Vassie Loll, MD 04/26/2023 4:54 PM  For on call review [email or web address].

## 2023-04-26 NOTE — Progress Notes (Addendum)
Patient underwent EGD and colonoscopy under propofol sedation.  Tolerated the procedure adequately.   FINDINGS:  Upper Endoscopy   - Normal esophagus.   - A single gastric polyp. Resected and retrieved. Clip ( MR conditional) was placed. Clip manufacturer: [employer].   - Gastritis. Biopsied.   - Normal duodenal bulb and second portion of the duodenum.  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 Biopsied. Tattooed 2- 3 cm distally . This mass was found 70 cm from anal verge   - Multiple large polyps in the transverse colon, in the ascending colon and in the cecum. Resection not attempted.   - There were large amount of large size polyps found from cecum to 60 cm from anal verge which were not removed . Two seperate areas were tatooed, proximal one is 2- 3 cm from the mass and distal tatoo is demarcation point where beyond that no polyps were seen and the colon was clean . Between the two tatooe site large colon polyp burden found ; not removed , also cecum with large colon polyp burden not removed . (See pictures)  - Diverticulosis in the left colon.   - Non- bleeding external and internal hemorrhoids.  RECOMMENDATIONS  -Obtain Staging CT Chest Abdomen Pelvis IV Contrast .  -Follow up Path .  -Cause of Iron deficiency anemia colon mass .  -Surgical evaluation ; most likley for evaluation of extended right hemicolectomy given large polyp burden not removed as outlined above   -Oncology evaluation.  -FDR to start colonoscopy at age 61 .  Spoke with patient son over the phone Wayne Middleton,Wayne Middleton who verbalizes understanding    Please recall GI with any further questions  Vista Lawman, MD Gastroenterology and Hepatology [facility] Gastroenterology

## 2023-04-26 NOTE — Transfer of Care (Signed)
Immediate Anesthesia Transfer of Care Note  Patient: [name]  Procedure(s) Performed: ESOPHAGOGASTRODUODENOSCOPY (EGD) WITH PROPOFOL COLONOSCOPY WITH PROPOFOL  Patient Location: PACU  Anesthesia Type:General  Level of Consciousness: awake  Airway & Oxygen Therapy: Patient Spontanous Breathing  Post-op Assessment: Report given to RN  Post vital signs: Reviewed and stable  Last Vitals:  Vitals Value Taken Time  BP 128/66 04/26/23 1301  Temp 36.8 C 04/26/23 1245  Pulse 82 04/26/23 1303  Resp 20 04/26/23 1307  SpO2 99 % 04/26/23 1303  Vitals shown include unfiled device data.  Last Pain:  Vitals:   04/26/23 1300  TempSrc:   PainSc: 0-No pain         Complications: No notable events documented.

## 2023-04-26 NOTE — Interval H&P Note (Signed)
History and Physical Interval Note:  04/26/2023 10:58 AM  Wayne Middleton  has presented today for surgery, with the diagnosis of anemia, heme positive stool. history of colon polyps..  The various methods of treatment have been discussed with the patient and family. After consideration of risks, benefits and other options for treatment, the patient has consented to  Procedure(s): ESOPHAGOGASTRODUODENOSCOPY (EGD) WITH PROPOFOL (N/A) COLONOSCOPY WITH PROPOFOL (N/A) as a surgical intervention.  The patient's history has been reviewed, patient examined, no change in status, stable for surgery.  I have reviewed the patient's chart and labs.  Questions were answered to the patient's satisfaction.     [name]

## 2023-04-26 NOTE — Plan of Care (Signed)
   Problem: Coping: Goal: Ability to adjust to condition or change in health will improve Outcome: Progressing   Problem: Fluid Volume: Goal: Ability to maintain a balanced intake and output will improve Outcome: Progressing   Problem: Health Behavior/Discharge Planning: Goal: Ability to identify and utilize available resources and services will improve Outcome: Progressing

## 2023-04-26 NOTE — Anesthesia Preprocedure Evaluation (Signed)
Anesthesia Evaluation    Airway Mallampati: II  TM Distance: >3 FB Neck ROM: Full    Dental  (+) Edentulous Upper, Edentulous Lower   Pulmonary pneumonia, COPD, former smoker   Pulmonary exam normal breath sounds clear to auscultation       Cardiovascular hypertension, +CHF  + dysrhythmias Atrial Fibrillation  Rhythm:Regular Rate:Normal     Neuro/Psych  PSYCHIATRIC DISORDERS Anxiety Depression     Neuromuscular disease    GI/Hepatic ,GERD  ,,  Endo/Other  diabetes    Renal/GU      Musculoskeletal   Abdominal   Peds  Hematology  (+) Blood dyscrasia, anemia   Anesthesia Other Findings   Reproductive/Obstetrics                             Anesthesia Physical Anesthesia Plan  ASA: 3  Anesthesia Plan: General   Post-op Pain Management: Minimal or no pain anticipated   Induction: Intravenous  PONV Risk Score and Plan: 1 and Propofol infusion  Airway Management Planned: Simple Face Mask and Nasal Cannula  Additional Equipment:   Intra-op Plan:   Post-operative Plan:   Informed Consent: I have reviewed the patients History and Physical, chart, labs and discussed the procedure including the risks, benefits and alternatives for the proposed anesthesia with the patient or authorized representative who has indicated his/her understanding and acceptance.       Plan Discussed with: Surgeon  Anesthesia Plan Comments:         Anesthesia Quick Evaluation

## 2023-04-27 ENCOUNTER — Inpatient Hospital Stay (HOSPITAL_COMMUNITY): Payer: Medicare HMO

## 2023-04-27 ENCOUNTER — Encounter (HOSPITAL_COMMUNITY): Payer: Self-pay | Admitting: Student

## 2023-04-27 DIAGNOSIS — R195 Other fecal abnormalities: Secondary | ICD-10-CM

## 2023-04-27 DIAGNOSIS — K922 Gastrointestinal hemorrhage, unspecified: Secondary | ICD-10-CM | POA: Diagnosis not present

## 2023-04-27 DIAGNOSIS — K6389 Other specified diseases of intestine: Secondary | ICD-10-CM

## 2023-04-27 DIAGNOSIS — D500 Iron deficiency anemia secondary to blood loss (chronic): Secondary | ICD-10-CM | POA: Diagnosis not present

## 2023-04-27 LAB — GLUCOSE, CAPILLARY
Glucose-Capillary: 124 mg/dL — ABNORMAL HIGH (ref 70–99)
Glucose-Capillary: 90 mg/dL (ref 70–99)
Glucose-Capillary: 122 mg/dL — ABNORMAL HIGH (ref 70–99)
Glucose-Capillary: 137 mg/dL — ABNORMAL HIGH (ref 70–99)

## 2023-04-27 LAB — PROTIME-INR
INR: 1.4 — ABNORMAL HIGH (ref 0.8–1.2)
Prothrombin Time: 17.5 s — ABNORMAL HIGH (ref 11.4–15.2)

## 2023-04-27 MED ORDER — PANTOPRAZOLE SODIUM 40 MG PO TBEC
40.0000 mg | DELAYED_RELEASE_TABLET | Freq: Two times a day (BID) | ORAL | Status: DC
  Administered 2023-04-27 – 2023-04-28 (×2): 40 mg via ORAL
  Filled 2023-04-27 (×2): qty 1

## 2023-04-27 MED ORDER — IOHEXOL 300 MG/ML  SOLN
100.0000 mL | Freq: Once | INTRAMUSCULAR | Status: AC | PRN
  Administered 2023-04-27: 100 mL via INTRAVENOUS

## 2023-04-27 MED ORDER — LORATADINE 10 MG PO TABS
10.0000 mg | ORAL_TABLET | Freq: Every day | ORAL | Status: DC
  Administered 2023-04-27 – 2023-04-28 (×2): 10 mg via ORAL
  Filled 2023-04-27 (×2): qty 1

## 2023-04-27 MED ORDER — IOHEXOL 300 MG/ML  SOLN
30.0000 mL | Freq: Once | INTRAMUSCULAR | Status: AC | PRN
  Administered 2023-04-27: 30 mL via ORAL

## 2023-04-27 NOTE — Progress Notes (Signed)
Progress Note   Patient: Wayne Middleton:[id number] DOB: Nov 08, 1940 DOA: 04/22/2023     5 DOS: the patient was seen and examined on 04/27/2023   Brief hospital admission course: As per H&P written by Dr. Lucianne Muss on 04/22/2023 FLORENTINO LAABS is a 83 y.o. male with Past medical history of chronic A-fib on Coumadin, HTN, diastolic CHF, DM, neuropathy, GERD, chronic pain, depression as reviewed from EMR, presented to [facility] with complaining of fatigue, confusion for few months and decreased oral intake.  Patient is not a good historian, developing dementia, patient was brought in by family, patient lives alone.  Patient has anorexia, decreased oral intake and feeling more fatigue and being confused for past few months.  Patient was seen by PCP, hemoglobin was low so patient was sent to ED for further workup and management.    ED Course: VS afebrile, HR 99, RR 18, BP 118/55, 100% on room air FOBT positive CBC Hb 5.9 Transferrin saturation 2% Check B12 and folate level CMP BUN 35, rest within normal range Hemoglobin A1c 6.8 CT head:No acute CT finding. Age related volume loss. 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  Assessment and Plan: 1-GI bleed -With concern for upper GI bleed in the setting of melenic stools, positive fecal occult blood test, hemoglobin of 5.9 at time of admission in a patient with chronic use of Coumadin. -Vitamin K and 2 units PRBCs has been transfused; IV iron was also provided -Latest hemoglobin currently 9.0. -Continue to follow hemoglobin trend intermittently. -Appreciate assistance and recommendations by GI; status post endoscopy and colonoscopy demonstrating possible colon mass as most likely source for patient's blood loss anemia. -Continue supportive care and follow GI service recommendation. -Continue PPI. -Will discuss with GI/general surgery for resumption of blood thinners. -No further overt bleeding currently reported and patient tolerating  diet.Marland Kitchen  2-iron deficiency anemia -IV iron provided -Status post endoscopy and colonoscopy demonstrating colon mass as patient's source for iron deficiency anemia.  3-chronic A-fib, hypertension and chronic diastolic CHF. -With underlying history of hypertension and diastolic heart failure -Holding Coumadin in the setting of GI bleed -Continue home antihypertensive regimen using atenolol and Cardizem -Follow blood pressure and adjust antihypertensive agents as needed -Continue to follow daily weights and strict intake and output. -Chronic diastolic heart failure currently compensated and is stable.Marland Kitchen  4-type 2 diabetes with neuropathy -Most recent A1c 6.8 -Continue sliding scale insulin -Resume home dose Lyrica. -Will also continue as needed oxycodone as mentioned below.  5-dementia and history of delirium -Continue Haldol and as needed Seroquel. -Mentation currently stable and adequately following commands.  6-chronic pain/depression -Continue Prozac and as needed oxycodone -Avoiding the use of NSAIDs in the setting of acute GI bleed.  Subjective:  No fever, no chest pain, no nausea vomiting.  Tolerating diet.  No upper bleeding.  Physical Exam: Vitals:   04/26/23 2207 04/27/23 0703 04/27/23 0803 04/27/23 1249  BP: 106/62 121/70 (!) 104/50 128/75  Pulse: 85 85 91   Resp: 19 18  18   Temp: 99 F (37.2 C) 98.2 F (36.8 C)  98.4 F (36.9 C)  TempSrc: Oral Oral    SpO2: 97% 97%  99%  Weight:      Height:       General exam: Alert, awake, oriented x 3; no overt bleeding, no abdominal pain, good appetite.  Reports no nausea or vomiting. Respiratory system: Good saturation on room air. Cardiovascular system: Rate controlled, no rubs, no gallops, no JVD. Gastrointestinal system:  Abdomen is nondistended, soft and nontender.  Positive bowel sounds. Central nervous system: No focal neurological deficits. Extremities: No cyanosis or clubbing. Skin: No petechiae. Psychiatry:  Judgement and insight appear stable; no suicidal ideation or hallucinations.  Stable mood.   Data Reviewed: CBC: WBCs 6.7, hemoglobin 9.0 and platelet count 2 44K Protime-INR: 16.7 and 1.3 respectively  Family Communication: Son updated at bedside.    Disposition: Status is: Inpatient Remains inpatient appropriate because: Continue treatment for acute GI bleed.   Planned Discharge Destination: Home  Time spent: 50 minutes  Author: Vassie Loll, MD 04/27/2023 6:04 PM  For on call review [email or web address].

## 2023-04-27 NOTE — Plan of Care (Signed)
   Problem: Coping: Goal: Ability to adjust to condition or change in health will improve Outcome: Progressing   Problem: Fluid Volume: Goal: Ability to maintain a balanced intake and output will improve Outcome: Progressing   Problem: Health Behavior/Discharge Planning: Goal: Ability to identify and utilize available resources and services will improve Outcome: Progressing

## 2023-04-28 ENCOUNTER — Encounter (HOSPITAL_COMMUNITY): Payer: Self-pay | Admitting: Gastroenterology

## 2023-04-28 ENCOUNTER — Telehealth: Payer: Self-pay | Admitting: Internal Medicine

## 2023-04-28 DIAGNOSIS — R195 Other fecal abnormalities: Secondary | ICD-10-CM | POA: Diagnosis not present

## 2023-04-28 DIAGNOSIS — K2970 Gastritis, unspecified, without bleeding: Secondary | ICD-10-CM

## 2023-04-28 DIAGNOSIS — K922 Gastrointestinal hemorrhage, unspecified: Secondary | ICD-10-CM | POA: Diagnosis not present

## 2023-04-28 DIAGNOSIS — D500 Iron deficiency anemia secondary to blood loss (chronic): Secondary | ICD-10-CM | POA: Diagnosis not present

## 2023-04-28 DIAGNOSIS — K317 Polyp of stomach and duodenum: Secondary | ICD-10-CM | POA: Diagnosis not present

## 2023-04-28 DIAGNOSIS — K2990 Gastroduodenitis, unspecified, without bleeding: Secondary | ICD-10-CM

## 2023-04-28 DIAGNOSIS — K6389 Other specified diseases of intestine: Secondary | ICD-10-CM

## 2023-04-28 DIAGNOSIS — G894 Chronic pain syndrome: Secondary | ICD-10-CM

## 2023-04-28 LAB — CBC
HCT: 30.1 % — ABNORMAL LOW (ref 39.0–52.0)
Hemoglobin: 8.2 g/dL — ABNORMAL LOW (ref 13.0–17.0)
MCH: 18.1 pg — ABNORMAL LOW (ref 26.0–34.0)
MCHC: 27.2 g/dL — ABNORMAL LOW (ref 30.0–36.0)
MCV: 66.6 fL — ABNORMAL LOW (ref 80.0–100.0)
Platelets: 204 10*3/uL (ref 150–400)
RBC: 4.52 MIL/uL (ref 4.22–5.81)
RDW: 33.2 % — ABNORMAL HIGH (ref 11.5–15.5)
WBC: 6.7 10*3/uL (ref 4.0–10.5)
nRBC: 0 % (ref 0.0–0.2)

## 2023-04-28 LAB — GLUCOSE, CAPILLARY
Glucose-Capillary: 114 mg/dL — ABNORMAL HIGH (ref 70–99)
Glucose-Capillary: 268 mg/dL — ABNORMAL HIGH (ref 70–99)

## 2023-04-28 LAB — PROTIME-INR
INR: 1.3 — ABNORMAL HIGH (ref 0.8–1.2)
Prothrombin Time: 16.8 s — ABNORMAL HIGH (ref 11.4–15.2)

## 2023-04-28 MED ORDER — OXYCODONE HCL 10 MG PO TABS: 5.0000 mg | ORAL_TABLET | Freq: Four times a day (QID) | ORAL | 0 refills | Status: DC | PRN

## 2023-04-28 NOTE — Discharge Summary (Signed)
Physician Discharge Summary   Patient: Wayne Middleton MRN: [id number] DOB: Sep 08, 1940  Admit date:     04/22/2023  Discharge date: 04/28/23  Discharge Physician: Vassie Loll   PCP: Anabel Halon, MD   Recommendations at discharge:  Repeat CBC to follow hemoglobin trend/stability Repeat basic metabolic panel to follow electrolytes and renal function Patient has been discharge off blood thinners due to increase risk of bleeding secondary to colon mass.  Further discuss and make any further adjustment. Advance care planning recommendedGoals of care discussion. Follow final results of colon biopsy and further discussed with patient regarding final decisions for future treatments/interventions.  Will benefit of outpatient palliative care if no surgery or chemo plan.   Discharge Diagnoses: Principal Problem:   GI bleeding Active Problems:   Occult blood in stools   Iron deficiency anemia due to chronic blood loss   Hepatic flexure mass   Gastritis and gastroduodenitis   Gastric polyp   Mass of hepatic flexure of colon  Brief Hospital admission narrative: As per H&P written by Dr. Lucianne Muss on 04/22/2023 Wayne Middleton is a 83 y.o. male with Past medical history of chronic A-fib on Coumadin, HTN, diastolic CHF, DM, neuropathy, GERD, chronic pain, depression as reviewed from EMR, presented to [facility] ED with complaining of fatigue, confusion for few months and decreased oral intake.  Patient is not a good historian, developing dementia, patient was brought in by family, patient lives alone.  Patient has anorexia, decreased oral intake and feeling more fatigue and being confused for past few months.  Patient was seen by PCP, hemoglobin was low so patient was sent to ED for further workup and management.    ED Course: VS afebrile, HR 99, RR 18, BP 118/55, 100% on room air FOBT positive CBC Hb 5.9 Transferrin saturation 2% Check B12 and folate level CMP BUN 35, rest within normal range Hemoglobin  A1c 6.8 CT head:No acute CT finding. Age related volume loss. 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  Assessment and Plan: 1-GI bleed -With concern for upper GI bleed in the setting of melenic stools, positive fecal occult blood test, hemoglobin of 5.9 at time of admission in a patient with chronic use of Coumadin. -Vitamin K and 2 units PRBCs has been transfused; IV iron was also provided -Latest hemoglobin currently 8.2 and overall is stable at discharge. -Continue to follow hemoglobin trend intermittently. -Appreciate assistance and recommendations by GI; status post endoscopy and colonoscopy demonstrating gastritis/gastroduodenitis (biopsies taken), hepatic flexure colon mass with concerns for colon cancer.  Biopsy pending -Patient adamant of not wanting surgical intervention or chemotherapy. -Continue PPI. -Per general surgery recommendations and GI no blood thinners at discharge.   2-iron deficiency anemia -IV iron provided -Status post endoscopy and colonoscopy demonstrating colon mass as patient's source for iron deficiency anemia. -Continue to follow hemoglobin trend.   3-chronic A-fib, hypertension and chronic diastolic CHF. -With underlying history of hypertension and diastolic heart failure -Holding Coumadin in the setting of GI bleed -Continue home antihypertensive regimen using atenolol and Cardizem -Follow blood pressure and adjust antihypertensive agents as needed -Continue to follow daily weights and strict intake and output. -Chronic diastolic heart failure currently compensated and stable.Marland Kitchen   4-type 2 diabetes with neuropathy -Most recent A1c 6.8 -Continue sliding scale insulin -Resume home dose Lyrica. -Will also continue as needed oxycodone as mentioned below.   5-dementia and history of delirium -Mentation currently stable and adequately following commands. -Continue constant reorientation and support.  6-chronic  pain/depression -Continue Prozac and as needed oxycodone -Avoiding the use of NSAIDs in the setting of acute GI bleed.  Consultants: General surgery and gastroenterology service. Procedures performed: See below for x-ray report; EGD/colonoscopy. Disposition: Home Diet recommendation: Heart healthy/low-sodium diet.  DISCHARGE MEDICATION: Allergies as of 04/28/2023   No Known Allergies      Medication List     STOP taking these medications    diphenoxylate-atropine 2.5-0.025 MG tablet Commonly known as: LOMOTIL   warfarin 5 MG tablet Commonly known as: COUMADIN       TAKE these medications    allopurinol 300 MG tablet Commonly known as: ZYLOPRIM Take 1 tablet (300 mg total) by mouth daily.   atenolol 25 MG tablet Commonly known as: TENORMIN Take 1 tablet (25 mg total) by mouth daily.   cetirizine 10 MG tablet Commonly known as: ZYRTEC Take 10 mg by mouth daily.   diltiazem 240 MG 24 hr capsule Commonly known as: CARDIZEM CD Take 1 capsule (240 mg total) by mouth daily.   FLUoxetine 40 MG capsule Commonly known as: PROZAC Take 1 capsule (40 mg total) by mouth daily.   fluticasone 50 MCG/ACT nasal spray Commonly known as: FLONASE Place 1 spray into both nostrils daily.   furosemide 40 MG tablet Commonly known as: Lasix Take 1 tablet (40 mg total) by mouth daily.   meclizine 25 MG tablet Commonly known as: ANTIVERT TAKE 1 TABLET THREE TIMES DAILY AS NEEDED FOR DIZZINESS What changed: See the new instructions.   ondansetron 4 MG tablet Commonly known as: Zofran Take 1 tablet (4 mg total) by mouth every 8 (eight) hours as needed for nausea or vomiting.   Oxycodone HCl 10 MG Tabs Take 0.5-1 tablets (5-10 mg total) by mouth every 6 (six) hours as needed (severe pain). What changed:  how much to take when to take this reasons to take this   pantoprazole 40 MG tablet Commonly known as: PROTONIX Take 1 tablet (40 mg total) by mouth daily.   potassium  chloride 10 MEQ CR capsule Commonly known as: MICRO-K Take 1 capsule (10 mEq total) by mouth daily.   pregabalin 200 MG capsule Commonly known as: LYRICA Take 1 capsule (200 mg total) by mouth 2 (two) times daily.   zolpidem 10 MG tablet Commonly known as: AMBIEN Take 1 tablet (10 mg total) by mouth at bedtime as needed for sleep.        Follow-up Information     Anabel Halon, MD. Schedule an appointment as soon as possible for a visit in 10 day(s).   Specialty: Internal Medicine Contact information: [address] [place] [address] [phone]                Discharge Exam: Ceasar Mons Weights   04/22/23 1205  Weight: 87 kg   General exam: Alert, awake, oriented x 3; no overt bleeding, no abdominal pain, good appetite.  Reports no nausea or vomiting. Respiratory system: Good saturation on room air. Cardiovascular system: Rate controlled, no rubs, no gallops, no JVD. Gastrointestinal system: Abdomen is nondistended, soft and nontender.  Positive bowel sounds. Central nervous system: No focal neurological deficits. Extremities: No cyanosis or clubbing. Skin: No petechiae. Psychiatry: Judgement and insight appear stable; no suicidal ideation or hallucinations.  Stable mood.  Condition at discharge: Stable and improved.  The results of significant diagnostics from this hospitalization (including imaging, microbiology, ancillary and laboratory) are listed below for reference.   Imaging Studies: CT CHEST ABDOMEN PELVIS  W CONTRAST Result Date: 04/28/2023 CLINICAL DATA:  Colon cancer staging.  * Tracking Code: BO * EXAM: CT CHEST, ABDOMEN, AND PELVIS WITH CONTRAST TECHNIQUE: Multidetector CT imaging of the chest, abdomen and pelvis was performed following the standard protocol during bolus administration of intravenous contrast. RADIATION DOSE REDUCTION: This exam was performed according to the departmental dose-optimization program which includes automated exposure  control, adjustment of the mA and/or kV according to patient size and/or use of iterative reconstruction technique. CONTRAST:  OMNIPAQUE IOHEXOL 300 MG/ML SOLN, 30mL OMNIPAQUE IOHEXOL 300 MG/ML SOLN COMPARISON:  Chest CT 04/09/2022 FINDINGS: CT CHEST FINDINGS Cardiovascular: Heart is enlarged. No substantial pericardial effusion. Coronary artery calcification is evident. Mild atherosclerotic calcification is noted in the wall of the thoracic aorta. Aortic valve calcification evident. Mediastinum/Nodes: No mediastinal lymphadenopathy. There is no hilar lymphadenopathy. The esophagus has normal imaging features. There is no axillary lymphadenopathy. Lungs/Pleura: Subpleural reticulation in both lungs suggests underlying component of chronic interstitial lung disease. Multiple bilateral pulmonary nodules identified measuring 2.8 cm in the right lower lobe on 122/6 and 1.5 cm in the left upper lobe on image 40/6. Additional bilateral 5-10 mm pulmonary nodules are evident. Bronchial wall thickening noted in the lower lungs with volume loss and collapse/consolidative opacity in the right middle lobe. No pleural effusion. Musculoskeletal: No worrisome lytic or sclerotic osseous abnormality. CT ABDOMEN PELVIS FINDINGS Hepatobiliary: Multiple ill-defined hypo dense lesions in the liver parenchyma are compatible with metastatic disease. Index lesion in the lateral segment left liver measures 15 mm on 72/2. Index lesion posterior right hepatic lobe measures 2.4 cm on 58/2. Multiple calcified gallstones evident. No intrahepatic or extrahepatic biliary dilation. Pancreas: No focal mass lesion. No dilatation of the main duct. No intraparenchymal cyst. No peripancreatic edema. Spleen: No splenomegaly. No suspicious focal mass lesion. Adrenals/Urinary Tract: No adrenal nodule or mass. 5 mm nonobstructing stone identified interpolar right kidney. 2.4 cm cyst identified upper interpolar left kidney. No followup imaging is  recommended. No evidence for hydroureter. The urinary bladder appears normal for the degree of distention. Stomach/Bowel: Stomach is unremarkable. No gastric wall thickening. No evidence of outlet obstruction. Apparent hemostatic clip noted in the gastric fundus. Duodenum is normally positioned as is the ligament of Treitz. No small bowel wall thickening. No small bowel dilatation. The terminal ileum is normal. The appendix is not well visualized, but there is no edema or inflammation in the region of the cecal tip to suggest appendicitis. Soft tissue lesion identified in the ascending colon towards the hepatic flexure (axial 83/2) consistent with the reported history of colon cancer. Subtle stranding in the adjacent fat but no definite transmural extension by CT imaging. Transverse and left colon unremarkable. Vascular/Lymphatic: There is mild atherosclerotic calcification of the abdominal aorta without aneurysm. There is no gastrohepatic or hepatoduodenal ligament lymphadenopathy. No retroperitoneal or mesenteric lymphadenopathy. No pelvic sidewall lymphadenopathy. Reproductive: Prostate gland is enlarged. Other: No intraperitoneal free fluid. Musculoskeletal: No worrisome lytic or sclerotic osseous abnormality. IMPRESSION: 1. Soft tissue lesion in the ascending colon towards the hepatic flexure consistent with the reported history of colon cancer. Subtle stranding in the adjacent fat but no definite transmural extension by CT imaging. 2. Multiple bilateral pulmonary nodules, largest measuring 2.8 cm in the right lower lobe, compatible with metastatic disease. 3. Multiple ill-defined hypo dense lesions in the liver parenchyma, compatible with metastatic disease. 4. Cholelithiasis. 5. 5 mm nonobstructing right renal stone. 6. Prostatomegaly. 7.  Aortic Atherosclerosis (ICD10-I70.0). Electronically Signed   By: Kennith Center  M.D.   On: 04/28/2023 06:29   CT Head Wo Contrast Result Date: 04/22/2023 CLINICAL DATA:   Mental status change of unknown cause EXAM: CT HEAD WITHOUT CONTRAST TECHNIQUE: Contiguous axial images were obtained from the base of the skull through the vertex without intravenous contrast. RADIATION DOSE REDUCTION: This exam was performed according to the departmental dose-optimization program which includes automated exposure control, adjustment of the mA and/or kV according to patient size and/or use of iterative reconstruction technique. COMPARISON:  04/17/2022 FINDINGS: Brain: Age related volume loss. 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 No sign of acute infarction, mass lesion, hemorrhage, hydrocephalus or extra-axial collection. Vascular: There is atherosclerotic calcification of the major vessels at the base of the brain. Skull: Negative Sinuses/Orbits: Clear/normal Other: None IMPRESSION: No acute CT finding. Age related volume loss. 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 Electronically Signed   By: Paulina Fusi M.D.   On: 04/22/2023 17:54    Microbiology: Results for orders placed or performed during the hospital encounter of 04/17/22  Resp panel by RT-PCR (RSV, Flu A&B, Covid) Anterior Nasal Swab     Status: None   Collection Time: 04/17/22  6:27 PM   Specimen: Anterior Nasal Swab  Result Value Ref Range Status   SARS Coronavirus 2 by RT PCR NEGATIVE NEGATIVE Final    Comment: (NOTE) SARS-CoV-2 target nucleic acids are NOT DETECTED.  The SARS-CoV-2 RNA is generally detectable in upper respiratory specimens during the acute phase of infection. The lowest concentration of SARS-CoV-2 viral copies this assay can detect is 138 copies/mL. A negative result does not preclude SARS-Cov-2 infection and should not be used as the sole basis for treatment or other patient management decisions. A negative result may occur with  improper specimen collection/handling, submission of specimen other than nasopharyngeal swab, presence of viral mutation(s)  within the areas targeted by this assay, and inadequate number of viral copies(<138 copies/mL). A negative result must be combined with clinical observations, patient history, and epidemiological information. The expected result is Negative.  Fact Sheet for Patients:  BloggerCourse.com  Fact Sheet for Healthcare Providers:  SeriousBroker.it  This test is no t yet approved or cleared by the [place] FDA and  has been authorized for detection and/or diagnosis of SARS-CoV-2 by FDA under an Emergency Use Authorization (EUA). This EUA will remain  in effect (meaning this test can be used) for the duration of the COVID-19 declaration under Section 564(b)(1) of the Act, 21 U.S.C.section 360bbb-3(b)(1), unless the authorization is terminated  or revoked sooner.       Influenza A by PCR NEGATIVE NEGATIVE Final   Influenza B by PCR NEGATIVE NEGATIVE Final    Comment: (NOTE) The Xpert Xpress SARS-CoV-2/FLU/RSV plus assay is intended as an aid in the diagnosis of influenza from Nasopharyngeal swab specimens and should not be used as a sole basis for treatment. Nasal washings and aspirates are unacceptable for Xpert Xpress SARS-CoV-2/FLU/RSV testing.  Fact Sheet for Patients: BloggerCourse.com  Fact Sheet for Healthcare Providers: SeriousBroker.it  This test is not yet approved or cleared by the [place] FDA and has been authorized for detection and/or diagnosis of SARS-CoV-2 by FDA under an Emergency Use Authorization (EUA). This EUA will remain in effect (meaning this test can be used) for the duration of the COVID-19 declaration under Section 564(b)(1) of the Act, 21 U.S.C. section 360bbb-3(b)(1), unless the authorization is terminated or revoked.     Resp Syncytial Virus by PCR NEGATIVE NEGATIVE Final  Comment: (NOTE) Fact Sheet for  Patients: BloggerCourse.com  Fact Sheet for Healthcare Providers: SeriousBroker.it  This test is not yet approved or cleared by the [place] FDA and has been authorized for detection and/or diagnosis of SARS-CoV-2 by FDA under an Emergency Use Authorization (EUA). This EUA will remain in effect (meaning this test can be used) for the duration of the COVID-19 declaration under Section 564(b)(1) of the Act, 21 U.S.C. section 360bbb-3(b)(1), unless the authorization is terminated or revoked.  Performed at [facility], [address] [place], [place] [address]   MRSA Next Gen by PCR, Nasal     Status: None   Collection Time: 04/17/22 11:15 PM   Specimen: Nasal Mucosa; Nasal Swab  Result Value Ref Range Status   MRSA by PCR Next Gen NOT DETECTED NOT DETECTED Final    Comment: (NOTE) The GeneXpert MRSA Assay (FDA approved for NASAL specimens only), is one component of a comprehensive MRSA colonization surveillance program. It is not intended to diagnose MRSA infection nor to guide or monitor treatment for MRSA infections. Test performance is not FDA approved in patients less than 42 years old. Performed at [facility], [address] [place], [place] [address]     Labs: CBC: Recent Labs  Lab 04/22/23 1208 04/23/23 0400 04/24/23 0326 04/25/23 0411 04/26/23 0459 04/28/23 0417  WBC 5.5 5.8 6.7 8.2 6.7 6.7  NEUTROABS 4.1  --   --   --   --   --   HGB 5.9* 7.2* 8.2* 8.3* 9.0* 8.2*  HCT 23.3* 27.0* 29.7* 30.7* 33.0* 30.1*  MCV 56.6* 59.5* 61.2* 62.8* 64.2* 66.6*  PLT 219 207 231 221 244 204   Basic Metabolic Panel: Recent Labs  Lab 04/22/23 1208 04/23/23 0400 04/24/23 0326  NA 135 135 136  K 3.8 3.6 3.5  CL 100 102 103  CO2 27 25 24   GLUCOSE 135* 106* 115*  BUN 19 13 13   CREATININE 1.02 0.89 1.05  CALCIUM 8.7* 8.4* 8.6*  MG  --  2.0 2.0  PHOS  --  2.5 2.5   Liver Function Tests: Recent Labs  Lab  04/22/23 1208  AST 16  ALT 14  ALKPHOS 75  BILITOT 0.8  PROT 6.9  ALBUMIN 3.2*   CBG: Recent Labs  Lab 04/27/23 1134 04/27/23 1620 04/27/23 2043 04/28/23 0729 04/28/23 1038  GLUCAP 90 122* 137* 114* 268*    Discharge time spent: greater than 30 minutes.  Signed: Vassie Loll, MD [facility] 04/28/2023

## 2023-04-28 NOTE — Telephone Encounter (Signed)
[name] came by office about his dad, came by office  last week for test got colon cancer and spread over to his liver, patient wants to know can Dr Allena Katz prescribe some pain medicine oxycodone for him.  He knows the other doctor Vassie Loll gave to him last but rather Dr Allena Katz take care of this.  Pharmacy: [facility].

## 2023-04-28 NOTE — Consult Note (Addendum)
[facility] Consult  Reason for Consult: Colon mass, GI requesting general surgery consult for potential surgical resection Referring Physician: Dr. Gwenlyn Perking  Chief Complaint   Fatigue     HPI: Wayne Middleton is a 83 y.o. male was admitted to the hospital with malaise, fatigue, and decreased oral intake.  Upon presentation to the hospital, he was thought to have an upper GI bleed with a hemoglobin of 5.9, on Coumadin for atrial fibrillation.  He underwent a EGD and colonoscopy on 2/15 with Dr. Tasia Catchings, with EGD demonstrating a gastric polyp without any evidence of bleeding source and colonoscopy demonstrating a malignant partially obstructing tumor at the hepatic flexure and multiple large polyps within the transverse colon, ascending colon, and cecum.  He subsequently underwent a CT of the chest abdomen and pelvis which demonstrated concerning findings for metastatic disease to both the lungs and liver.  Upon evaluation this morning, the patient overall just does not feel well.  He tolerated his diet without nausea and vomiting.  He has passed a little bit of flatus but denies any bowel movements since his colonoscopy.  He has a past medical history significant for atrial fibrillation on Coumadin, diabetes, hypertension, and cardiomyopathy.  He believes he might of had an abdominal surgery to his bowel, but he cannot give any further details of this.  Past Medical History:  Diagnosis Date   Acute metabolic encephalopathy 04/17/2022   Acute respiratory failure (HCC) 04/09/2022   Atrial fibrillation (HCC)    CAP (community acquired pneumonia) 04/09/2022   Cardiomyopathy (HCC)    Tachycardia mediated, LVEF improved to 50-55% as of 2012   Cholelithiasis    Chronic anticoagulation    Chronic anxiety    Diabetes (HCC)    Essential hypertension    History of GI bleed 2011   Hospitalized for lower GI bleed following polypectomy   Peripheral neuropathy 11/11/2018    Past Surgical  History:  Procedure Laterality Date   BACK SURGERY  03/11/1969   post-trauma   BIOPSY  04/26/2023   Procedure: BIOPSY;  Surgeon: Franky Macho, MD;  [facility];  Service: Endoscopy;;   COLONOSCOPY W/ POLYPECTOMY  2006, 2011   postprocedure bleed requiring hospital admission   COLONOSCOPY WITH PROPOFOL N/A 04/26/2023   Procedure: COLONOSCOPY WITH PROPOFOL;  Surgeon: Franky Macho, MD;  [facility];  Service: Endoscopy;  Laterality: N/A;   ESOPHAGOGASTRODUODENOSCOPY (EGD) WITH PROPOFOL N/A 04/26/2023   Procedure: ESOPHAGOGASTRODUODENOSCOPY (EGD) WITH PROPOFOL;  Surgeon: Franky Macho, MD;  [facility];  Service: Endoscopy;  Laterality: N/A;   POLYPECTOMY  04/26/2023   Procedure: POLYPECTOMY;  Surgeon: Franky Macho, MD;  [facility];  Service: Endoscopy;;   TUMOR EXCISION  1960s   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    Family History  Problem Relation Age of Onset   Stroke Mother 6   Heart attack Father 18   Lung cancer Brother        3 of 5 brothers deceased due to Carcinoma of the lung    Social History   Tobacco Use   Smoking status: Never   Smokeless tobacco: Never  Vaping Use   Vaping status: Never Used  Substance Use Topics   Alcohol use: No    Alcohol/week: 0.0 standard drinks of alcohol   Drug use: No    Medications: I have reviewed the patient's current medications.  No Known Allergies   ROS:  Pertinent items are noted  in HPI.  Blood pressure 115/67, pulse 82, temperature 99.4 F (37.4 C), temperature source Oral, resp. rate 18, height 5\' 8"  (1.727 m), weight 87 kg, SpO2 98%. Physical Exam Vitals reviewed.  Constitutional:      Appearance: Normal appearance.  HENT:     Head: Normocephalic and atraumatic.  Eyes:     Extraocular Movements: Extraocular movements intact.     Pupils: Pupils are equal, round, and reactive to light.  Cardiovascular:     Rate and Rhythm: Normal rate.  Pulmonary:      Effort: Pulmonary effort is normal.  Abdominal:     Comments: Abdomen soft, nondistended, no percussion tenderness, nontender to palpation; no rigidity, guarding, rebound tenderness  Musculoskeletal:        General: Normal range of motion.     Cervical back: Normal range of motion.  Skin:    General: Skin is warm and dry.  Neurological:     General: No focal deficit present.     Mental Status: He is alert.  Psychiatric:        Mood and Affect: Mood normal.        Behavior: Behavior normal.     Results: Results for orders placed or performed during the hospital encounter of 04/22/23 (from the past 48 hours)  Glucose, capillary     Status: Abnormal   Collection Time: 04/26/23  1:33 PM  Result Value Ref Range   Glucose-Capillary 111 (H) 70 - 99 mg/dL    Comment: Glucose reference range applies only to samples taken after fasting for at least 8 hours.  Glucose, capillary     Status: Abnormal   Collection Time: 04/26/23  4:41 PM  Result Value Ref Range   Glucose-Capillary 141 (H) 70 - 99 mg/dL    Comment: Glucose reference range applies only to samples taken after fasting for at least 8 hours.   Comment 1 Notify RN    Comment 2 Document in Chart   Glucose, capillary     Status: Abnormal   Collection Time: 04/26/23  9:42 PM  Result Value Ref Range   Glucose-Capillary 142 (H) 70 - 99 mg/dL    Comment: Glucose reference range applies only to samples taken after fasting for at least 8 hours.  Protime-INR     Status: Abnormal   Collection Time: 04/27/23  4:37 AM  Result Value Ref Range   Prothrombin Time 17.5 (H) 11.4 - 15.2 seconds   INR 1.4 (H) 0.8 - 1.2    Comment: (NOTE) INR goal varies based on device and disease states. Performed at [facility], [address], [place], [place] [address]   Glucose, capillary     Status: Abnormal   Collection Time: 04/27/23  7:33 AM  Result Value Ref Range   Glucose-Capillary 124 (H) 70 - 99 mg/dL    Comment: Glucose reference range  applies only to samples taken after fasting for at least 8 hours.  Glucose, capillary     Status: None   Collection Time: 04/27/23 11:34 AM  Result Value Ref Range   Glucose-Capillary 90 70 - 99 mg/dL    Comment: Glucose reference range applies only to samples taken after fasting for at least 8 hours.  Glucose, capillary     Status: Abnormal   Collection Time: 04/27/23  4:20 PM  Result Value Ref Range   Glucose-Capillary 122 (H) 70 - 99 mg/dL    Comment: Glucose reference range applies only to samples taken after fasting for at least 8  hours.  Glucose, capillary     Status: Abnormal   Collection Time: 04/27/23  8:43 PM  Result Value Ref Range   Glucose-Capillary 137 (H) 70 - 99 mg/dL    Comment: Glucose reference range applies only to samples taken after fasting for at least 8 hours.  Protime-INR     Status: Abnormal   Collection Time: 04/28/23  4:17 AM  Result Value Ref Range   Prothrombin Time 16.8 (H) 11.4 - 15.2 seconds   INR 1.3 (H) 0.8 - 1.2    Comment: (NOTE) INR goal varies based on device and disease states. Performed at [facility], [address] [place], [place] [address]   CBC     Status: Abnormal   Collection Time: 04/28/23  4:17 AM  Result Value Ref Range   WBC 6.7 4.0 - 10.5 K/uL   RBC 4.52 4.22 - 5.81 MIL/uL   Hemoglobin 8.2 (L) 13.0 - 17.0 g/dL    Comment: Reticulocyte Hemoglobin testing may be clinically indicated, consider ordering this additional test [id number]    HCT 30.1 (L) 39.0 - 52.0 %   MCV 66.6 (L) 80.0 - 100.0 fL   MCH 18.1 (L) 26.0 - 34.0 pg   MCHC 27.2 (L) 30.0 - 36.0 g/dL   RDW 56.2 (H) 13.0 - 86.5 %   Platelets 204 150 - 400 K/uL    Comment: REPEATED TO VERIFY PLATELET COUNT CONFIRMED BY SMEAR    nRBC 0.0 0.0 - 0.2 %    Comment: Performed at [facility], [address] [place], [place] [address]  Glucose, capillary     Status: Abnormal   Collection Time: 04/28/23  7:29 AM  Result Value Ref Range   Glucose-Capillary 114 (H) 70 - 99  mg/dL    Comment: Glucose reference range applies only to samples taken after fasting for at least 8 hours.   Comment 1 Notify RN    Comment 2 Document in Chart   Glucose, capillary     Status: Abnormal   Collection Time: 04/28/23 10:38 AM  Result Value Ref Range   Glucose-Capillary 268 (H) 70 - 99 mg/dL    Comment: Glucose reference range applies only to samples taken after fasting for at least 8 hours.   Comment 1 Notify RN    Comment 2 Document in Chart     CT CHEST ABDOMEN PELVIS W CONTRAST Result Date: 04/28/2023 CLINICAL DATA:  Colon cancer staging.  * Tracking Code: BO * EXAM: CT CHEST, ABDOMEN, AND PELVIS WITH CONTRAST TECHNIQUE: Multidetector CT imaging of the chest, abdomen and pelvis was performed following the standard protocol during bolus administration of intravenous contrast. RADIATION DOSE REDUCTION: This exam was performed according to the departmental dose-optimization program which includes automated exposure control, adjustment of the mA and/or kV according to patient size and/or use of iterative reconstruction technique. CONTRAST:  OMNIPAQUE IOHEXOL 300 MG/ML SOLN, 30mL OMNIPAQUE IOHEXOL 300 MG/ML SOLN COMPARISON:  Chest CT 04/09/2022 FINDINGS: CT CHEST FINDINGS Cardiovascular: Heart is enlarged. No substantial pericardial effusion. Coronary artery calcification is evident. Mild atherosclerotic calcification is noted in the wall of the thoracic aorta. Aortic valve calcification evident. Mediastinum/Nodes: No mediastinal lymphadenopathy. There is no hilar lymphadenopathy. The esophagus has normal imaging features. There is no axillary lymphadenopathy. Lungs/Pleura: Subpleural reticulation in both lungs suggests underlying component of chronic interstitial lung disease. Multiple bilateral pulmonary nodules identified measuring 2.8 cm in the right lower lobe on 122/6 and 1.5 cm in the left upper lobe on image 40/6. Additional  bilateral 5-10 mm pulmonary nodules are evident.  Bronchial wall thickening noted in the lower lungs with volume loss and collapse/consolidative opacity in the right middle lobe. No pleural effusion. Musculoskeletal: No worrisome lytic or sclerotic osseous abnormality. CT ABDOMEN PELVIS FINDINGS Hepatobiliary: Multiple ill-defined hypo dense lesions in the liver parenchyma are compatible with metastatic disease. Index lesion in the lateral segment left liver measures 15 mm on 72/2. Index lesion posterior right hepatic lobe measures 2.4 cm on 58/2. Multiple calcified gallstones evident. No intrahepatic or extrahepatic biliary dilation. Pancreas: No focal mass lesion. No dilatation of the main duct. No intraparenchymal cyst. No peripancreatic edema. Spleen: No splenomegaly. No suspicious focal mass lesion. Adrenals/Urinary Tract: No adrenal nodule or mass. 5 mm nonobstructing stone identified interpolar right kidney. 2.4 cm cyst identified upper interpolar left kidney. No followup imaging is recommended. No evidence for hydroureter. The urinary bladder appears normal for the degree of distention. Stomach/Bowel: Stomach is unremarkable. No gastric wall thickening. No evidence of outlet obstruction. Apparent hemostatic clip noted in the gastric fundus. Duodenum is normally positioned as is the ligament of Treitz. No small bowel wall thickening. No small bowel dilatation. The terminal ileum is normal. The appendix is not well visualized, but there is no edema or inflammation in the region of the cecal tip to suggest appendicitis. Soft tissue lesion identified in the ascending colon towards the hepatic flexure (axial 83/2) consistent with the reported history of colon cancer. Subtle stranding in the adjacent fat but no definite transmural extension by CT imaging. Transverse and left colon unremarkable. Vascular/Lymphatic: There is mild atherosclerotic calcification of the abdominal aorta without aneurysm. There is no gastrohepatic or hepatoduodenal ligament  lymphadenopathy. No retroperitoneal or mesenteric lymphadenopathy. No pelvic sidewall lymphadenopathy. Reproductive: Prostate gland is enlarged. Other: No intraperitoneal free fluid. Musculoskeletal: No worrisome lytic or sclerotic osseous abnormality. IMPRESSION: 1. Soft tissue lesion in the ascending colon towards the hepatic flexure consistent with the reported history of colon cancer. Subtle stranding in the adjacent fat but no definite transmural extension by CT imaging. 2. Multiple bilateral pulmonary nodules, largest measuring 2.8 cm in the right lower lobe, compatible with metastatic disease. 3. Multiple ill-defined hypo dense lesions in the liver parenchyma, compatible with metastatic disease. 4. Cholelithiasis. 5. 5 mm nonobstructing right renal stone. 6. Prostatomegaly. 7.  Aortic Atherosclerosis (ICD10-I70.0). Electronically Signed   By: Kennith Center M.D.   On: 04/28/2023 06:29     Assessment & Plan:  Wayne Middleton is a 83 y.o. male who was admitted to the hospital with malaise, fatigue, and decreased oral intake.  He was noted to have a GI bleed, and on colonoscopy was found to have a colon mass at the hepatic flexure which was thought to be the source of his bleeding.  CT of the chest abdomen and pelvis demonstrated concern for metastatic disease to both the lungs and liver.  Imaging and blood work evaluated by myself.  -I discussed with both the patient and his oldest son, Raiford Noble, the findings of his colonoscopy and his CT scan.  Patient has expressed that he does not want any surgical interventions.  I further questioned whether he would want surgery if he were bleeding from his colon mass or obstructed, to which he stated that he does not want any surgery.  I discussed with the patient's son that given the metastatic disease, I would only recommend palliative surgery if he were to have obstructive symptoms or having life-threatening bleeding from the mass -Given patient was having  GI bleed  from colon mass, recommend against restarting his Coumadin.  I would keep him off of his Coumadin indefinitely.  I did discuss with the patient's son that this puts him at higher risk for developing a clot from his atrial fibrillation and potentially suffering a stroke or PE.  He is understanding and agrees with holding his blood thinners -Patient's son expresses that he just wants to make his father comfortable.  Patient also stating that he wants to go home -I recommend against any surgical interventions at this time.   -Patient would benefit from palliative care consultation and possible evaluation by oncology if the patient would like to pursue any treatments -If the patient begins to have obstructive symptoms such as nausea, vomiting, and obstipation, would need to reevaluate need for possible palliative colectomy for an obstructive mass. -Discussed case with Dr. Gwenlyn Perking.  Appreciate hospitalist recommendations  All questions were answered to the satisfaction of the patient and family.  Please note that I spent 85 minutes on this encounter including personally reviewing imaging studies, coordination of the patient's care, counseling the patient and performing appropriate documentation.   -- Theophilus Kinds, DO [facility] [address] [place], [place] [address] [phone] (office)

## 2023-04-28 NOTE — Progress Notes (Signed)
Chaplain visited Pt and also met Pt's sons, at bedside. Pt and Chaplain had a moment to talk and reflect on Pt's feelings and emotions at this time. Chaplain invited Pt to reflect and review his life, to reflect on Pt's strengths at this moment. Pt shared with Chaplain about feeling in peace for what he had had in life. Pt is grateful for the life and family and feel at peace with God. Pt did a life review with Chaplain and shared his feelings and expectations. Pt was grateful for Chaplain's visit.    04/28/23 1709  Spiritual Encounters  Type of Visit Initial  Care provided to: Pt and family  Referral source Clinical staff;Family  Reason for visit Routine spiritual support  OnCall Visit No  Spiritual Framework  Presenting Themes Meaning/purpose/sources of inspiration  Community/Connection Family  Patient Stress Factors None identified  Family Stress Factors Major life changes

## 2023-04-29 ENCOUNTER — Telehealth: Payer: Self-pay

## 2023-04-29 LAB — SURGICAL PATHOLOGY

## 2023-04-29 NOTE — Progress Notes (Signed)
Hi Ann  Can you please refer this patient to oncology for diagnosis of metastatic colon cancer.  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  Also stomach polyp is hyperplastic polyp which is not necessarily considered benign in stomach and surveillance is usually pursued and healthy patients with good prognosis.  Patient [name] would like patient to be seen by oncology for initial consultation to discuss options  I again reiterated to patient [name] for all FDR ( First degree relatives) to begin colonoscopy at age 38   All questions were answered in detail over the phone

## 2023-04-29 NOTE — Transitions of Care (Post Inpatient/ED Visit) (Signed)
   04/29/2023  Name: Wayne Middleton MRN: [id number] DOB: 1941/02/22  Today's TOC FU Call Status: Today's TOC FU Call Status:: Unsuccessful Call (1st Attempt) Unsuccessful Call (1st Attempt) Date: 04/29/23 (a man answered and reports patient is not available)  Attempted to reach the patient regarding the most recent Inpatient/ED visit.  Follow Up Plan: Additional outreach attempts will be made to reach the patient to complete the Transitions of Care (Post Inpatient/ED visit) call.   Lonia Chimera, RN, BSN, CEN [employer]- Transition of Care Team.  [facility] [phone]

## 2023-04-30 ENCOUNTER — Telehealth: Payer: Self-pay

## 2023-04-30 NOTE — Transitions of Care (Post Inpatient/ED Visit) (Signed)
   04/30/2023  Name: Wayne Middleton MRN: [id number] DOB: 29-May-1940  Today's TOC FU Call Status: Today's TOC FU Call Status:: Unsuccessful Call (2nd Attempt) Unsuccessful Call (2nd Attempt) Date: 04/30/23  Attempted to reach the patient regarding the most recent Inpatient/ED visit.  Follow Up Plan: Additional outreach attempts will be made to reach the patient to complete the Transitions of Care (Post Inpatient/ED visit) call.   Lonia Chimera, RN, BSN, CEN [employer]- Transition of Care Team.  [facility] [phone]

## 2023-05-01 ENCOUNTER — Telehealth: Payer: Self-pay

## 2023-05-01 NOTE — Transitions of Care (Post Inpatient/ED Visit) (Signed)
   05/01/2023  Name: ZAKKARY THIBAULT MRN: [id number] DOB: 1940-09-15  Today's TOC FU Call Status: Today's TOC FU Call Status:: Unsuccessful Call (3rd Attempt) Unsuccessful Call (3rd Attempt) Date: 05/01/23  Attempted to reach the patient regarding the most recent Inpatient/ED visit.  Follow Up Plan: No further outreach attempts will be made at this time. We have been unable to contact the patient.  Lonia Chimera, RN, BSN, CEN [employer]- Transition of Care Team.  [facility] [phone]

## 2023-05-05 ENCOUNTER — Inpatient Hospital Stay: Payer: Medicare HMO

## 2023-05-05 ENCOUNTER — Inpatient Hospital Stay: Payer: Medicare HMO | Attending: Hematology | Admitting: Hematology

## 2023-05-05 VITALS — BP 118/71 | HR 97 | Temp 97.5°F | Resp 20

## 2023-05-05 DIAGNOSIS — C787 Secondary malignant neoplasm of liver and intrahepatic bile duct: Secondary | ICD-10-CM | POA: Diagnosis not present

## 2023-05-05 DIAGNOSIS — R197 Diarrhea, unspecified: Secondary | ICD-10-CM | POA: Diagnosis not present

## 2023-05-05 DIAGNOSIS — C7802 Secondary malignant neoplasm of left lung: Secondary | ICD-10-CM | POA: Insufficient documentation

## 2023-05-05 DIAGNOSIS — C183 Malignant neoplasm of hepatic flexure: Secondary | ICD-10-CM

## 2023-05-05 DIAGNOSIS — R634 Abnormal weight loss: Secondary | ICD-10-CM | POA: Insufficient documentation

## 2023-05-05 DIAGNOSIS — M545 Low back pain, unspecified: Secondary | ICD-10-CM | POA: Diagnosis not present

## 2023-05-05 DIAGNOSIS — D509 Iron deficiency anemia, unspecified: Secondary | ICD-10-CM | POA: Diagnosis not present

## 2023-05-05 DIAGNOSIS — F32A Depression, unspecified: Secondary | ICD-10-CM | POA: Diagnosis not present

## 2023-05-05 DIAGNOSIS — K8020 Calculus of gallbladder without cholecystitis without obstruction: Secondary | ICD-10-CM | POA: Insufficient documentation

## 2023-05-05 DIAGNOSIS — I700 Atherosclerosis of aorta: Secondary | ICD-10-CM | POA: Insufficient documentation

## 2023-05-05 DIAGNOSIS — D649 Anemia, unspecified: Secondary | ICD-10-CM | POA: Diagnosis not present

## 2023-05-05 DIAGNOSIS — I4891 Unspecified atrial fibrillation: Secondary | ICD-10-CM | POA: Diagnosis not present

## 2023-05-05 DIAGNOSIS — N400 Enlarged prostate without lower urinary tract symptoms: Secondary | ICD-10-CM | POA: Insufficient documentation

## 2023-05-05 DIAGNOSIS — K625 Hemorrhage of anus and rectum: Secondary | ICD-10-CM | POA: Diagnosis not present

## 2023-05-05 DIAGNOSIS — N200 Calculus of kidney: Secondary | ICD-10-CM | POA: Insufficient documentation

## 2023-05-05 DIAGNOSIS — E1140 Type 2 diabetes mellitus with diabetic neuropathy, unspecified: Secondary | ICD-10-CM | POA: Diagnosis not present

## 2023-05-05 DIAGNOSIS — C189 Malignant neoplasm of colon, unspecified: Secondary | ICD-10-CM | POA: Insufficient documentation

## 2023-05-05 DIAGNOSIS — Z79899 Other long term (current) drug therapy: Secondary | ICD-10-CM | POA: Insufficient documentation

## 2023-05-05 DIAGNOSIS — Z801 Family history of malignant neoplasm of trachea, bronchus and lung: Secondary | ICD-10-CM | POA: Insufficient documentation

## 2023-05-05 DIAGNOSIS — Z823 Family history of stroke: Secondary | ICD-10-CM | POA: Diagnosis not present

## 2023-05-05 DIAGNOSIS — I10 Essential (primary) hypertension: Secondary | ICD-10-CM | POA: Insufficient documentation

## 2023-05-05 DIAGNOSIS — C7801 Secondary malignant neoplasm of right lung: Secondary | ICD-10-CM

## 2023-05-05 DIAGNOSIS — Z7901 Long term (current) use of anticoagulants: Secondary | ICD-10-CM | POA: Diagnosis not present

## 2023-05-05 DIAGNOSIS — I6782 Cerebral ischemia: Secondary | ICD-10-CM | POA: Insufficient documentation

## 2023-05-05 DIAGNOSIS — R0602 Shortness of breath: Secondary | ICD-10-CM

## 2023-05-05 DIAGNOSIS — Z8249 Family history of ischemic heart disease and other diseases of the circulatory system: Secondary | ICD-10-CM | POA: Insufficient documentation

## 2023-05-05 LAB — CBC WITH DIFFERENTIAL/PLATELET
Abs Immature Granulocytes: 0.04 10*3/uL (ref 0.00–0.07)
Basophils Absolute: 0.1 10*3/uL (ref 0.0–0.1)
Basophils Relative: 1 %
Eosinophils Absolute: 0.2 10*3/uL (ref 0.0–0.5)
Eosinophils Relative: 4 %
HCT: 31.7 % — ABNORMAL LOW (ref 39.0–52.0)
Hemoglobin: 8.5 g/dL — ABNORMAL LOW (ref 13.0–17.0)
Immature Granulocytes: 1 %
Lymphocytes Relative: 13 %
Lymphs Abs: 0.8 10*3/uL (ref 0.7–4.0)
MCH: 18.8 pg — ABNORMAL LOW (ref 26.0–34.0)
MCHC: 26.8 g/dL — ABNORMAL LOW (ref 30.0–36.0)
MCV: 70.3 fL — ABNORMAL LOW (ref 80.0–100.0)
Monocytes Absolute: 0.9 10*3/uL (ref 0.1–1.0)
Monocytes Relative: 15 %
Neutro Abs: 3.9 10*3/uL (ref 1.7–7.7)
Neutrophils Relative %: 66 %
Platelets: 276 10*3/uL (ref 150–400)
RBC: 4.51 MIL/uL (ref 4.22–5.81)
WBC: 5.9 10*3/uL (ref 4.0–10.5)
nRBC: 0 % (ref 0.0–0.2)

## 2023-05-05 LAB — VITAMIN B12: Vitamin B-12: 359 pg/mL (ref 180–914)

## 2023-05-05 LAB — FOLATE: Folate: 37.7 ng/mL (ref >5.9)

## 2023-05-05 LAB — IRON AND TIBC
Iron: 20 ug/dL — ABNORMAL LOW (ref 45–182)
Saturation Ratios: 7 % — ABNORMAL LOW (ref 17.9–39.5)
TIBC: 309 ug/dL (ref 250–450)
UIBC: 289 ug/dL

## 2023-05-05 LAB — SAMPLE TO BLOOD BANK

## 2023-05-05 LAB — FERRITIN: Ferritin: 74 ng/mL (ref 24–336)

## 2023-05-05 MED ORDER — MEGESTROL ACETATE 400 MG/10ML PO SUSP: 400.0000 mg | Freq: Two times a day (BID) | ORAL | 3 refills | Status: DC

## 2023-05-05 NOTE — Patient Instructions (Addendum)
 [facility]  Discharge Instructions  You were seen and examined today by Dr. Ellin Saba. Dr. Ellin Saba is a medical oncologist, meaning that he specializes in the treatment of cancer diagnoses. Dr. Ellin Saba discussed your past medical history, family history of cancers, and the events that led to you being here today.  You were referred to Dr. Ellin Saba due to a new diagnosis of Stage IV Colon Cancer. Unfortunately, the cancer started in your colon but has spread to your liver and lungs. This means that it would be treated with chemotherapy, but it will not cure the cancer. In order to treat the cancer safely with chemotherapy, you would need a Port-A-Cath. We can ask the surgeon to place this.  You are anemic, Dr. Ellin Saba will check additional labs today.  Dr. Ellin Saba has prescribed an appetite stimulant.  We will arrange for you to start chemotherapy.  Thank you for choosing [facility] to provide your oncology and hematology care.   To afford each patient quality time with our provider, please arrive at least 15 minutes before your scheduled appointment time. You may need to reschedule your appointment if you arrive late (10 or more minutes). Arriving late affects you and other patients whose appointments are after yours.  Also, if you miss three or more appointments without notifying the office, you may be dismissed from the clinic at the provider's discretion.    Again, thank you for choosing [facility]  Our hope is that these requests will decrease the amount of time that you wait before being seen by our physicians.   If you have a lab appointment with the Cancer Center - please note that after April 8th, all labs will be drawn in the cancer center.  You do not have to check in or register with the main entrance as you have in the past but will complete your check-in at the cancer center.             _____________________________________________________________  Should you have questions after your visit to [facility], please contact our office at [phone] and follow the prompts.  Our office hours are 8:00 a.m. to 4:30 p.m. Monday - Thursday and 8:00 a.m. to 2:30 p.m. Friday.  Please note that voicemails left after 4:00 p.m. may not be returned until the following business day.  We are closed weekends and all major holidays.  You do have access to a nurse 24-7, just call the main number to the clinic [phone] and do not press any options, hold on the line and a nurse will answer the phone.    For prescription refill requests, have your pharmacy contact our office and allow 72 hours.    Masks are no longer required in the cancer centers. If you would like for your care team to wear a mask while they are taking care of you, please let them know. You may have one support person who is at least 84 years old accompany you for your appointments.

## 2023-05-05 NOTE — Progress Notes (Signed)
 [facility] [address] [place] [address]   Clinic Day:  05/05/2023  Referring physician: Anabel Halon, MD  Patient Care Team: Anabel Halon, MD as PCP - General (Internal Medicine) Jonelle Sidle, MD as PCP - Cardiology (Cardiology) Doreatha Massed, MD as Medical Oncologist (Medical Oncology) Therese Sarah, RN as Oncology Nurse Navigator (Medical Oncology)   ASSESSMENT & PLAN:   Assessment:  1.  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 - Pathology: Invasive moderately differentiated adenocarcinoma - CT CAP (04/27/2023): Soft tissue lesion in the ascending colon towards the hepatic flexure.  Multiple bilateral lung nodules, largest 2.8 cm in the right lower lobe.  Multiple ill-defined hypodense liver lesions consistent with metastatic disease.  2.  Social/family history: - He lives by himself at home.  He has cats and dogs.  His 2 sons live close by.  He is independent of ADLs.  He also drives. - 2 sisters had cancer.  Type unknown to the patient.  2 brothers had lung cancer.  Plan:  1.  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 - We have also reviewed images of the CT scan with the patient and his 2 sons. - We discussed prognosis of metastatic colon cancer with and without treatment. - Patient would like to have palliative chemotherapy.  If he does not tolerate it well, he will discontinue it. - Recommend port placement. - Recommend NGS testing on the biopsy. - RTC after port placement.  2.  Microcytic anemia: - Recent hospitalization on 04/21/2018 for GI bleed.  Received 3 units PRBC. - Will check CBC today and ferritin, iron panel, B12, folic acid, MMA and copper. -  Will consider parenteral iron therapy.  3.  Low back pain: - He had back surgery and takes oxycodone 10 mg 4 and half tablets per day.  Prescribed by Dr. Allena Katz.  4.  Weight loss: - He lost about 50 pounds in the last 6 months.  He reports decreased appetite. - Recommend starting Megace 400 mg twice daily.   Orders Placed This Encounter  Procedures   CBC with Differential    Standing Status:   Future    Number of Occurrences:   1    Expected Date:   05/05/2023    Expiration Date:   05/04/2024   CEA    Standing Status:   Future    Number of Occurrences:   1    Expected Date:   05/05/2023    Expiration Date:   05/04/2024   Ferritin    Standing Status:   Future    Number of Occurrences:   1    Expected Date:   05/05/2023    Expiration Date:   05/04/2024   Iron and TIBC ([facility] ONLY)    Standing Status:   Future    Number of Occurrences:   1    Expected Date:   05/05/2023    Expiration Date:   05/04/2024   Vitamin B12    Standing Status:   Future    Number of Occurrences:   1    Expected Date:   05/05/2023    Expiration Date:   05/04/2024   Folate    Standing Status:   Future    Number of  Occurrences:   1    Expected Date:   05/05/2023    Expiration Date:   05/04/2024   Methylmalonic acid, serum    Standing Status:   Future    Number of Occurrences:   1    Expected Date:   05/05/2023    Expiration Date:   05/04/2024   Copper, serum    Standing Status:   Future    Number of Occurrences:   1    Expected Date:   05/05/2023    Expiration Date:   05/04/2024   Ambulatory Referral to [facility]    Referral Priority:   Routine    Referral Type:   Consultation    Referral Reason:   Specialty Services Required    Number of Visits Requested:   1   Ambulatory referral to Social Work    Referral Priority:   Routine    Referral Type:   Consultation    Referral Reason:   Specialty Services Required    Number of Visits Requested:   1   Sample to Blood Bank     Standing Status:   Future    Number of Occurrences:   1    Expected Date:   05/05/2023    Expiration Date:   05/04/2024    Release to patient:   Immediate      I,Helena R Teague,acting as a scribe for Doreatha Massed, MD.,have documented all relevant documentation on the behalf of Doreatha Massed, MD,as directed by  Doreatha Massed, MD while in the presence of Doreatha Massed, MD.   I, Doreatha Massed MD, have reviewed the above documentation for accuracy and completeness, and I agree with the above.   Doreatha Massed, MD   2/24/20254:41 PM  CHIEF COMPLAINT/PURPOSE OF CONSULT:   Diagnosis: Metastatic colon cancer to the liver and lungs   Cancer Staging  Primary cancer of hepatic flexure of colon [facility]) Staging form: Colon and Rectum, AJCC 8th Edition - Clinical stage from 05/05/2023: Stage IVB (cTX, cNX, cM1b) - Unsigned    Prior Therapy: None  Current Therapy: Under workup   HISTORY OF PRESENT ILLNESS:   Oncology History   No history exists.      Wayne [facility] is a 83 y.o. male presenting to clinic today for evaluation of metastatic colon cancer at the request of Dr. Tasia Catchings.  Patient was admitted to the hospital from 04/22/23 to 04/28/23 for a GI bleed. Wayne [facility] was transfused 2 units of PRBC's and IV iron. He underwent an EGD and colonoscopy on 2/15 with Dr. Tasia Catchings. Surgical pathology of the biopsy of the hepatic flexure of the colon revealed: invasive moderately differentiated adenocarcinoma. Polypectomy and biopsy of the stomach were negative for  H. pylori, intestinal metaplasia, dysplasia and carcinoma.  CT C/A/P done on 216/25 showed: Soft tissue lesion in the ascending colon towards the hepatic flexure consistent with the reported history of colon cancer. Subtle stranding in the adjacent fat but no definite transmural extension by CT imaging. Multiple bilateral pulmonary nodules, largest measuring 2.8 cm in the right lower lobe, compatible with metastatic  disease. Multiple ill-defined hypo dense lesions in the liver parenchyma, compatible with metastatic disease. Cholelithiasis. 5 mm nonobstructing right renal stone. Prostatomegaly.  Today, he states that he is doing well overall. His appetite level is at 25%. His energy level is at 10%.  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   Cholelithiasis    Chronic anticoagulation    Chronic anxiety    Diabetes (HCC)    Essential hypertension    History of GI bleed 2011   Hospitalized for lower GI bleed following polypectomy   Peripheral neuropathy 11/11/2018    Surgical History: Past Surgical History:  Procedure Laterality Date   BACK SURGERY  03/11/1969   post-trauma   BIOPSY  04/26/2023   Procedure: BIOPSY;  Surgeon: Franky Macho, MD;  [facility];  Service: Endoscopy;;   COLONOSCOPY W/ POLYPECTOMY  2006, 2011   postprocedure bleed requiring hospital admission   COLONOSCOPY WITH PROPOFOL N/A 04/26/2023   Procedure: COLONOSCOPY WITH PROPOFOL;  Surgeon: Franky Macho, MD;  [facility];  Service: Endoscopy;  Laterality: N/A;   ESOPHAGOGASTRODUODENOSCOPY (EGD) WITH PROPOFOL N/A 04/26/2023   Procedure: ESOPHAGOGASTRODUODENOSCOPY (EGD) WITH PROPOFOL;  Surgeon: Franky Macho, MD;  [facility];  Service: Endoscopy;  Laterality: N/A;   POLYPECTOMY  04/26/2023   Procedure: POLYPECTOMY;  Surgeon: Franky Macho, MD;  [facility];  Service: Endoscopy;;   TUMOR EXCISION  1960s   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    Social History: Social History   Socioeconomic History   Marital status: Widowed    Spouse name: Not on file   Number of  children: Not on file   Years of education: Not on file   Highest education level: Not on file  Occupational History   Occupation: Retired from [employer]  Tobacco Use   Smoking status: Never   Smokeless tobacco: Never  Vaping Use   Vaping status: Never Used  Substance and Sexual Activity   Alcohol use: No    Alcohol/week: 0.0 standard drinks of alcohol   Drug use: No   Sexual activity: Not Currently  Other Topics Concern   Not on file  Social History Narrative   Patient previously walked four miles a day   Social Drivers of Corporate investment banker Strain: Not on file  Food Insecurity: No Food Insecurity (04/23/2023)   Hunger Vital Sign    Worried About Running Out of Food in the Last Year: Never true    Ran Out of Food in the Last Year: Never true  Transportation Needs: No Transportation Needs (04/23/2023)   PRAPARE - Administrator, Civil Service (Medical): No    Lack of Transportation (Non-Medical): No  Physical Activity: Not on file  Stress: Not on file  Social Connections: Moderately Integrated (04/23/2023)   Social Connection and Isolation Panel [NHANES]    Frequency of Communication with Friends and Family: More than three times a week    Frequency of Social Gatherings with Friends and Family: More than three times a week    Attends Religious Services: More than 4 times per year    Active Member of [employer] or Organizations: Yes    Attends Banker Meetings: More than 4 times per year    Marital Status: Divorced  Intimate Partner Violence: Not At Risk (04/23/2023)   Humiliation, Afraid, Rape, and Kick questionnaire    Fear of Current or Ex-Partner: No    Emotionally Abused: No    Physically Abused: No    Sexually Abused: No    Family History: Family History  Problem Relation Age of Onset   Stroke Mother 52   Heart attack Father 47   Lung cancer Brother        3 of 5 brothers deceased due to Carcinoma of the lung     Current Medications:  Current Outpatient Medications:    allopurinol (ZYLOPRIM) 300 MG tablet, Take 1 tablet (300 mg total) by mouth daily., Disp: 30 tablet, Rfl: 6   atenolol (TENORMIN) 25 MG tablet, Take 1 tablet (25 mg total) by mouth daily., Disp: 90 tablet, Rfl: 3   cetirizine (ZYRTEC) 10 MG tablet, Take 10 mg by mouth daily., Disp: , Rfl:    diltiazem (CARDIZEM CD) 240 MG 24 hr capsule, Take 1 capsule (240 mg total) by mouth daily., Disp: 90 capsule, Rfl: 1   FLUoxetine (PROZAC) 40 MG capsule, Take 1 capsule (40 mg total) by mouth daily., Disp: 90 capsule, Rfl: 1   fluticasone (FLONASE) 50 MCG/ACT nasal spray, Place 1 spray into both nostrils daily., Disp: 48 g, Rfl: 3   furosemide (LASIX) 40 MG tablet, Take 1 tablet (40 mg total) by mouth daily., Disp: 90 tablet, Rfl: 1   meclizine (ANTIVERT) 25 MG tablet, TAKE 1 TABLET THREE TIMES DAILY AS NEEDED FOR DIZZINESS (Patient taking differently: Take 25 mg by mouth 2 (two) times daily as needed for nausea or dizziness.), Disp: 90 tablet, Rfl: 2   megestrol (MEGACE) 400 MG/10ML suspension, Take 10 mLs (400 mg total) by mouth 2 (two) times daily., Disp: 480 mL, Rfl: 3   ondansetron (ZOFRAN) 4 MG tablet, Take 1 tablet (4 mg total) by mouth every 8 (eight) hours as needed for nausea or vomiting., Disp: 20 tablet, Rfl: 0   Oxycodone HCl 10 MG TABS, Take 0.5-1 tablets (5-10 mg total) by mouth every 6 (six) hours as needed (severe pain)., Disp: 30 tablet, Rfl: 0   pantoprazole (PROTONIX) 40 MG tablet, Take 1 tablet (40 mg total) by mouth daily., Disp: 90 tablet, Rfl: 1   potassium chloride (MICRO-K) 10 MEQ CR capsule, Take 1 capsule (10 mEq total) by mouth daily., Disp: 30 capsule, Rfl: 1   pregabalin (LYRICA) 200 MG capsule, Take 1 capsule (200 mg total) by mouth 2 (two) times daily., Disp: 60 capsule, Rfl: 3   zolpidem (AMBIEN) 10 MG tablet, Take 1 tablet (10 mg total) by mouth at bedtime as needed for sleep., Disp: 30 tablet, Rfl: 0    Allergies: No Known Allergies  REVIEW OF SYSTEMS:   Review of Systems  Constitutional:  Negative for chills, fatigue and fever.  HENT:   Negative for lump/mass, mouth sores, nosebleeds, sore throat and trouble swallowing.   Eyes:  Negative for eye problems.  Respiratory:  Positive for shortness of breath. Negative for cough.   Cardiovascular:  Negative for chest pain, leg swelling and palpitations.  Gastrointestinal:  Positive for diarrhea. Negative for abdominal pain, constipation, nausea and vomiting.  Genitourinary:  Negative for bladder incontinence, difficulty urinating, dysuria, frequency, hematuria and nocturia.   Musculoskeletal:  Negative for arthralgias, back pain, flank pain, myalgias and neck pain.  Skin:  Negative for itching and rash.  Neurological:  Negative for dizziness, headaches and numbness.  Hematological:  Does not bruise/bleed easily.  Psychiatric/Behavioral:  Positive for depression. Negative for sleep disturbance and suicidal ideas. The patient is not nervous/anxious.   All other systems reviewed and are negative.    VITALS:   Blood pressure 118/71, pulse 97, temperature (!) 97.5 F (36.4 C), temperature source Tympanic, resp. rate 20, SpO2  93%.  Wt Readings from Last 3 Encounters:  04/22/23 191 lb 12.8 oz (87 kg)  04/21/23 191 lb 12.8 oz (87 kg)  12/18/22 161 lb (73 kg)    There is no height or weight on file to calculate BMI.  Performance status (ECOG): 1 - Symptomatic but completely ambulatory  PHYSICAL EXAM:   Physical Exam Vitals and nursing note reviewed. Exam conducted with a chaperone present.  Constitutional:      Appearance: Normal appearance.  Cardiovascular:     Rate and Rhythm: Normal rate and regular rhythm.     Pulses: Normal pulses.     Heart sounds: Normal heart sounds.  Pulmonary:     Effort: Pulmonary effort is normal.     Breath sounds: Normal breath sounds.  Abdominal:     Palpations: Abdomen is soft. There is no  hepatomegaly, splenomegaly or mass.     Tenderness: There is no abdominal tenderness.  Musculoskeletal:     Right lower leg: No edema.     Left lower leg: No edema.  Lymphadenopathy:     Cervical: No cervical adenopathy.     Right cervical: No superficial, deep or posterior cervical adenopathy.    Left cervical: No superficial, deep or posterior cervical adenopathy.     Upper Body:     Right upper body: No supraclavicular or axillary adenopathy.     Left upper body: No supraclavicular or axillary adenopathy.  Neurological:     General: No focal deficit present.     Mental Status: He is alert and oriented to person, place, and time.  Psychiatric:        Mood and Affect: Mood normal.        Behavior: Behavior normal.     LABS:   CBC    Component Value Date/Time   WBC 5.9 05/05/2023 1427   RBC 4.51 05/05/2023 1427   HGB 8.5 (L) 05/05/2023 1427   HGB 6.0 (LL) 04/21/2023 1406   HCT 31.7 (L) 05/05/2023 1427   HCT 23.8 (L) 04/21/2023 1406   PLT 276 05/05/2023 1427   PLT 199 04/21/2023 1406   MCV 70.3 (L) 05/05/2023 1427   MCV 57 (L) 04/21/2023 1406   MCH 18.8 (L) 05/05/2023 1427   MCHC 26.8 (L) 05/05/2023 1427   RDW Not Measured 05/05/2023 1427   RDW 20.7 (H) 04/21/2023 1406   LYMPHSABS 0.8 05/05/2023 1427   LYMPHSABS 0.8 04/21/2023 1406   MONOABS 0.9 05/05/2023 1427   EOSABS 0.2 05/05/2023 1427   EOSABS 0.1 04/21/2023 1406   BASOSABS 0.1 05/05/2023 1427   BASOSABS 0.1 04/21/2023 1406    CMP    Component Value Date/Time   NA 136 04/24/2023 0326   NA 136 04/21/2023 1406   K 3.5 04/24/2023 0326   CL 103 04/24/2023 0326   CO2 24 04/24/2023 0326   GLUCOSE 115 (H) 04/24/2023 0326   BUN 13 04/24/2023 0326   BUN 21 04/21/2023 1406   CREATININE 1.05 04/24/2023 0326   CREATININE 1.04 01/02/2016 0758   CALCIUM 8.6 (L) 04/24/2023 0326   PROT 6.9 04/22/2023 1208   PROT 6.4 04/21/2023 1406   ALBUMIN 3.2 (L) 04/22/2023 1208   ALBUMIN 3.7 04/21/2023 1406   AST 16  04/22/2023 1208   ALT 14 04/22/2023 1208   ALKPHOS 75 04/22/2023 1208   BILITOT 0.8 04/22/2023 1208   BILITOT 0.6 04/21/2023 1406   GFRNONAA >60 04/24/2023 0326   GFRAA  02/22/2010 2303    >60  The eGFR has been calculated using the MDRD equation. This calculation has not been validated in all clinical situations. eGFR's persistently <60 mL/min signify possible Chronic Kidney Disease.     No results found for: "CEA1", "CEA" / No results found for: "CEA1", "CEA" No results found for: "PSA1" No results found for: "ZOX096" No results found for: "CAN125"  No results found for: "TOTALPROTELP", "ALBUMINELP", "A1GS", "A2GS", "BETS", "BETA2SER", "GAMS", "MSPIKE", "SPEI" Lab Results  Component Value Date   TIBC 411 04/21/2023   TIBC 404 01/02/2010   TIBC 404 01/02/2010   FERRITIN 29 (L) 04/21/2023   FERRITIN 23 01/02/2010   IRONPCTSAT 2 (LL) 04/21/2023   IRONPCTSAT 16 (L) 01/02/2010   IRONPCTSAT 16 01/02/2010   No results found for: "LDH"   STUDIES:   CT CHEST ABDOMEN PELVIS W CONTRAST Result Date: 04/28/2023 CLINICAL DATA:  Colon cancer staging.  * Tracking Code: BO * EXAM: CT CHEST, ABDOMEN, AND PELVIS WITH CONTRAST TECHNIQUE: Multidetector CT imaging of the chest, abdomen and pelvis was performed following the standard protocol during bolus administration of intravenous contrast. RADIATION DOSE REDUCTION: This exam was performed according to the departmental dose-optimization program which includes automated exposure control, adjustment of the mA and/or kV according to patient size and/or use of iterative reconstruction technique. CONTRAST:  OMNIPAQUE IOHEXOL 300 MG/ML SOLN, 30mL OMNIPAQUE IOHEXOL 300 MG/ML SOLN COMPARISON:  Chest CT 04/09/2022 FINDINGS: CT CHEST FINDINGS Cardiovascular: Heart is enlarged. No substantial pericardial effusion. Coronary artery calcification is evident. Mild atherosclerotic calcification is noted in the wall of the thoracic aorta. Aortic  valve calcification evident. Mediastinum/Nodes: No mediastinal lymphadenopathy. There is no hilar lymphadenopathy. The esophagus has normal imaging features. There is no axillary lymphadenopathy. Lungs/Pleura: Subpleural reticulation in both lungs suggests underlying component of chronic interstitial lung disease. Multiple bilateral pulmonary nodules identified measuring 2.8 cm in the right lower lobe on 122/6 and 1.5 cm in the left upper lobe on image 40/6. Additional bilateral 5-10 mm pulmonary nodules are evident. Bronchial wall thickening noted in the lower lungs with volume loss and collapse/consolidative opacity in the right middle lobe. No pleural effusion. Musculoskeletal: No worrisome lytic or sclerotic osseous abnormality. CT ABDOMEN PELVIS FINDINGS Hepatobiliary: Multiple ill-defined hypo dense lesions in the liver parenchyma are compatible with metastatic disease. Index lesion in the lateral segment left liver measures 15 mm on 72/2. Index lesion posterior right hepatic lobe measures 2.4 cm on 58/2. Multiple calcified gallstones evident. No intrahepatic or extrahepatic biliary dilation. Pancreas: No focal mass lesion. No dilatation of the main duct. No intraparenchymal cyst. No peripancreatic edema. Spleen: No splenomegaly. No suspicious focal mass lesion. Adrenals/Urinary Tract: No adrenal nodule or mass. 5 mm nonobstructing stone identified interpolar right kidney. 2.4 cm cyst identified upper interpolar left kidney. No followup imaging is recommended. No evidence for hydroureter. The urinary bladder appears normal for the degree of distention. Stomach/Bowel: Stomach is unremarkable. No gastric wall thickening. No evidence of outlet obstruction. Apparent hemostatic clip noted in the gastric fundus. Duodenum is normally positioned as is the ligament of Treitz. No small bowel wall thickening. No small bowel dilatation. The terminal ileum is normal. The appendix is not well visualized, but there is no  edema or inflammation in the region of the cecal tip to suggest appendicitis. Soft tissue lesion identified in the ascending colon towards the hepatic flexure (axial 83/2) consistent with the reported history of colon cancer. Subtle stranding in the adjacent fat but no definite transmural extension by CT imaging. Transverse and left colon  unremarkable. Vascular/Lymphatic: There is mild atherosclerotic calcification of the abdominal aorta without aneurysm. There is no gastrohepatic or hepatoduodenal ligament lymphadenopathy. No retroperitoneal or mesenteric lymphadenopathy. No pelvic sidewall lymphadenopathy. Reproductive: Prostate gland is enlarged. Other: No intraperitoneal free fluid. Musculoskeletal: No worrisome lytic or sclerotic osseous abnormality. IMPRESSION: 1. Soft tissue lesion in the ascending colon towards the hepatic flexure consistent with the reported history of colon cancer. Subtle stranding in the adjacent fat but no definite transmural extension by CT imaging. 2. Multiple bilateral pulmonary nodules, largest measuring 2.8 cm in the right lower lobe, compatible with metastatic disease. 3. Multiple ill-defined hypo dense lesions in the liver parenchyma, compatible with metastatic disease. 4. Cholelithiasis. 5. 5 mm nonobstructing right renal stone. 6. Prostatomegaly. 7.  Aortic Atherosclerosis (ICD10-I70.0). Electronically Signed   By: Kennith Middleton M.D.   On: 04/28/2023 06:29   CT Head Wo Contrast Result Date: 04/22/2023 CLINICAL DATA:  Mental status change of unknown cause EXAM: CT HEAD WITHOUT CONTRAST TECHNIQUE: Contiguous axial images were obtained from the base of the skull through the vertex without intravenous contrast. RADIATION DOSE REDUCTION: This exam was performed according to the departmental dose-optimization program which includes automated exposure control, adjustment of the mA and/or kV according to patient size and/or use of iterative reconstruction technique. COMPARISON:   04/17/2022 FINDINGS: Brain: Age related volume loss. 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 No sign of acute infarction, mass lesion, hemorrhage, hydrocephalus or extra-axial collection. Vascular: There is atherosclerotic calcification of the major vessels at the base of the brain. Skull: Negative Sinuses/Orbits: Clear/normal Other: None IMPRESSION: No acute CT finding. Age related volume loss. 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 Electronically Signed   By: Paulina Fusi M.D.   On: 04/22/2023 17:54

## 2023-05-06 ENCOUNTER — Ambulatory Visit: Payer: Self-pay | Admitting: Internal Medicine

## 2023-05-06 ENCOUNTER — Other Ambulatory Visit: Payer: Self-pay

## 2023-05-06 ENCOUNTER — Other Ambulatory Visit (HOSPITAL_COMMUNITY): Payer: Self-pay | Admitting: Hematology

## 2023-05-06 ENCOUNTER — Other Ambulatory Visit: Payer: Self-pay | Admitting: Internal Medicine

## 2023-05-06 DIAGNOSIS — G4701 Insomnia due to medical condition: Secondary | ICD-10-CM

## 2023-05-06 DIAGNOSIS — C183 Malignant neoplasm of hepatic flexure: Secondary | ICD-10-CM

## 2023-05-06 LAB — CEA: CEA: 8.7 ng/mL — ABNORMAL HIGH (ref 0.0–4.7)

## 2023-05-06 MED ORDER — ZOLPIDEM TARTRATE 10 MG PO TABS
10.0000 mg | ORAL_TABLET | Freq: Every evening | ORAL | 2 refills | Status: DC | PRN
Start: 2023-05-06 — End: 2023-06-24

## 2023-05-06 NOTE — Telephone Encounter (Signed)
 Copied from CRM [id number]. Topic: Clinical - Medication Question >> May 06, 2023  3:18 PM Dennison Nancy wrote: Reason for CRM: Darrel Shewmake the son of patient want to know the medication just picked up  Megestrol acet 40 mg /ml and take twice a day and want to know if patient take the need further instruction have question is want to know how to measure  Patient was diagnosis with cancer recently  Please call [phone] Reason for Disposition  Caller has medicine question only, adult not sick, AND triager answers question  Answer Assessment - Initial Assessment Questions 1. NAME of MEDICINE: "What medicine(s) are you calling about?"     Megastrol 2. QUESTION: "What is your question?" (e.g., double dose of medicine, side effect)     How to measure 10 ml 3. PRESCRIBER: "Who prescribed the medicine?" Reason: if prescribed by specialist, call should be referred to that group.     Cancer doctor  Protocols used: Medication Question Call-A-AH

## 2023-05-06 NOTE — Telephone Encounter (Signed)
 Copied from CRM [id number]. Topic: Clinical - Medication Refill >> May 06, 2023 12:22 PM Thomes Dinning wrote: Most Recent Primary Care Visit:  Provider: Anabel Halon  Department: [facility]  Visit Type: OFFICE VISIT  Date: 04/21/2023  Medication:  zolpidem (AMBIEN) 10 MG tablet  Has the patient contacted their pharmacy? Yes (Agent: If no, request that the patient contact the pharmacy for the refill. If patient does not wish to contact the pharmacy document the reason why and proceed with request.) (Agent: If yes, when and what did the pharmacy advise?) Patient has called the pharmacy and he has not been able to get a hold of anyone  Is this the correct pharmacy for this prescription? Yes If no, delete pharmacy and type the correct one.  This is the patient's preferred pharmacy:  [facility] [place], [place] - [address] [place] [address] Phone: [phone] Fax: [phone]  Has the prescription been filled recently? No  Is the patient out of the medication? Yes  Has the patient been seen for an appointment in the last year OR does the patient have an upcoming appointment? Yes  Can we respond through MyChart? No  Agent: Please be advised that Rx refills may take up to 3 business days. We ask that you follow-up with your pharmacy.

## 2023-05-06 NOTE — Telephone Encounter (Signed)
 Last Fill: 03/24/23 30 tabs/0 refills  Last OV: 04/21/23 Next OV: 07/21/23  Routing to provider for review/authorization.

## 2023-05-06 NOTE — Telephone Encounter (Signed)
 Chief Complaint: How to give 10 ml of megastrol Disposition: [] ED /[] Urgent Care (no appt availability in office) / [] Appointment(In office/virtual)/ []  [facility]/ [x] Home Care/ [] Refused Recommended Disposition /[] [facility]/ []  Follow-up with PCP Additional Notes: Patient's son Dorris Vangorder, on [place], called regarding the measuring of Megastrol. He says he looked it up and it said 1 tsp is 5 ml, so he will give 2. I asked if he has a medicine cup or syringe from the pharmacy. He says no, they didn't give him one. I advised to check with the pharmacy for either a medicine cup or a syringe to accurately measure out 10 ml. Advised the teaspoons used for eating may not be as accurate as a medicine cup or syringe He says his wife has insulin syringes. Advised that is not what he will need to use for medication to be given. He asked when should the medication be started, advised today. He says is it best to take by mouth or another way. Advised by mouth as prescribed, advised to reach out to the provider who prescribed it with any questions regarding the medication and reach out to the pharmacy to obtain a medicine cup or syringe to administer the medication. He verbalized understanding.

## 2023-05-07 ENCOUNTER — Inpatient Hospital Stay: Payer: Medicare HMO | Admitting: Licensed Clinical Social Worker

## 2023-05-07 DIAGNOSIS — C183 Malignant neoplasm of hepatic flexure: Secondary | ICD-10-CM

## 2023-05-07 LAB — COPPER, SERUM: Copper: 139 ug/dL — ABNORMAL HIGH (ref 69–132)

## 2023-05-07 LAB — METHYLMALONIC ACID, SERUM: Methylmalonic Acid, Quantitative: 336 nmol/L (ref 0–378)

## 2023-05-07 NOTE — Progress Notes (Signed)
 [facility] CSW Progress Note  Visual merchandiser  received a referral to assess pt.  CSW attempted to contact pt by phone; however, the line constantly rang busy.  CSW then called pt's son Laban Emperor.  Darrell answered and reported that pt is upset at the moment and has taken his phone off the hook.  Pt verbalized to son that he does not know if he actually wishes to pursue treatment.  Darrell was on his way to try to speak w/ his father.  CSW agreed to call back later this afternoon.  Pt's number continued to ring w/ no answer and Darrell's number went to voicemail.  CSW left a voicemail w/ contact details for supportive services should pt decide to move forward w/ treatment.        Rachel Moulds, LCSW Clinical Social Worker [facility]

## 2023-05-08 ENCOUNTER — Other Ambulatory Visit: Payer: Self-pay | Admitting: Internal Medicine

## 2023-05-08 DIAGNOSIS — I5032 Chronic diastolic (congestive) heart failure: Secondary | ICD-10-CM

## 2023-05-08 NOTE — Patient Instructions (Addendum)
 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  You will be treated in the clinic every 2 weeks with a combination of chemotherapy drugs. Those drugs are irinotecan, fluorouracil, and leucovorin (a vitamin, not chemo). The intent of treatment is to control this cancer, prevent it from spreading further, and to alleviate any symptoms you may be having related to this disease. You will see the doctor regularly throughout treatment.    We will obtain blood work from you prior to every treatment and monitor your results to make sure it is safe to give your treatment. The doctor monitors your response to treatment by the way you are feeling, your blood work, and by obtaining scans periodically.  There will be wait times while you are here for treatment.  It will take about 30 minutes to 1 hour for your lab work to result. Then there will be wait times while pharmacy mixes your medications.       Medications you will receive in the clinic prior to your chemotherapy medications:   Aloxi:  ALOXI is used in adults to help prevent nausea and vomiting that happens with certain chemotherapy drugs.  Aloxi is a long acting medication, and will remain in your system for about two days.    Emend:  This is an anti-nausea medication that is used with Aloxi to help prevent nausea and vomiting caused by chemotherapy.   Dexamethasone:  This is a steroid given prior to chemotherapy to help prevent allergic reactions; it may also help prevent and control nausea and diarrhea.         Irinotecan (Camptosar)     About This Drug  Irinotecan is used to treat cancer. It is given in the vein (IV). It will take 1.5 hours to infuse.     Possible Side Effects   Bone marrow suppression. This is a decrease in the number of white blood cells, red blood cells, and platelets. This may raise your risk of infection, make you tired and weak, and raise your risk of bleeding.      Soreness of the mouth and throat. You may have red areas, white patches, or sores that hurt.     Nausea and vomiting (throwing up)     Pain in your abdomen  Diarrhea (loose bowel movements)     Constipation (unable to move bowels)     Decreased appetite (decreased hunger)     Weight loss     Changes in your liver function     Pain     Weakness     Fever     Infection     Hair loss. Hair loss is often temporary, although with certain medicine, hair loss can sometimes be permanent. Hair loss may happen suddenly or gradually. If you lose hair, you may lose it from your head, face, armpits, pubic area, chest, and/or legs. You may also notice your hair getting thin.    Note: Each of the side effects above was reported in 30% or greater of patients treated with irinotecan alone or in combination with other chemotherapy. Not all possible side effects are included above.    Warnings and Precautions     Severe diarrhea and colitis which is swelling (inflammation) in the colon - symptoms are diarrhea, stomach cramping, and sometimes blood in the bowel movements. Very rarely, an abnormal hole in your stomach, small and/or large intestine can happen. Diarrhea can begin shortly after the infusion  or up to a week or two after, and can be life-threatening if it leads to dehydration, and other complications.      Severe bone marrow suppression which can be life-threatening     Allergic reactions, including anaphylaxis are rare but may happen in some patients. Signs of allergic reaction to this drug may be swelling of the face, feeling like your tongue or throat are swelling, trouble breathing, rash, itching, fever, chills, feeling dizzy, and/or feeling that your heart is beating in a fast or not normal way. If this happens, do not take another dose of this drug. You should get urgent medical treatment.     Changes in your kidney function which can cause kidney failure and be life-threatening      Inflammation (swelling) or scarring of the lungs, which can be life-threatening. You may have a cough or trouble breathing. Note: Some of the side effects above are very rare. If you have concerns and/or questions, please discuss them with your medical team. Important Information     This drug may be present in the saliva, tears, sweat, urine, stool, vomit, semen, and vaginal secretions. Talk to your doctor and/or your nurse about the necessary precautions to take during this time.     It is important that you notify your doctor and/or nurse at the first sign of diarrhea, so they can provide you with anti-diarrheal medication and give you further instructions. Notify your doctor and/ or nurse if you are taking anti-diarrheal medication and your symptoms have not improved or are worsening.      Treating Side Effects     Manage tiredness by pacing your activities for the day.     Be sure to include periods of rest between energy-draining activities.     To decrease the risk of infection, wash your hands regularly.  Avoid close contact with people who have a cold, the flu, or other infections.     Take your temperature as your doctor or nurse tells you, and whenever you feel like you may have a fever.     To help decrease the risk of bleeding, use a soft toothbrush. Check with your nurse before using dental floss.     Be very careful when using knives or tools.     Use an electric shaver instead of a razor.     Drink plenty of fluids (a minimum of eight glasses per day is recommended).     If you throw up or have diarrhea, you should drink more fluids so that you do not become dehydrated (lack of water in the body from losing too much fluid).     Mouth care is very important. Your mouth care should consist of routine, gentle cleaning of your teeth or dentures and rinsing your mouth with a mixture of 1/2 teaspoon of salt in 8 ounces of water or 1/2 teaspoon of baking soda in 8 ounces of water.  This should be done at least after each meal and at bedtime.     If you have mouth sores, avoid mouthwash that has alcohol. Also avoid alcohol and smoking because they can bother your mouth and throat.      To help with nausea and vomiting, eat small, frequent meals instead of three large meals a day. Choose foods and drinks that are at room temperature. Ask your nurse or doctor about other helpful tips and medicine that is available to help stop or lessen these symptoms.     If  you have diarrhea, eat low-fiber foods that are high in protein and calories and avoid foods that can irritate your digestive tracts or lead to cramping.     If you are not able to move your bowels, check with your doctor or nurse before you use enemas, laxatives, or suppositories.     Ask your doctor or nurse about medicines that are available to help stop or lessen constipation and/ or diarrhea.     To help with decreased appetite, eat small, frequent meals. Eat foods high in calories and protein, such as meat, poultry, fish, dry beans, tofu, eggs, nuts, milk, yogurt, cheese, ice cream, pudding, and nutritional supplements.     Consider using sauces and spices to increase taste. Daily exercise, with your doctor's approval, may increase your appetite.     To help with weight loss, drink fluids that contribute calories (whole milk, juice, soft drinks, sweetened beverages, milkshakes, and nutritional supplements) instead of water.     Keeping your pain under control is important to your well-being. Please tell your doctor or nurse if you are experiencing pain.     To help with hair loss, wash with a mild shampoo and avoid washing your hair every day. Avoid coloring your hair.     Avoid rubbing your scalp, pat your hair or scalp dry.     Limit your use of hair spray, electric curlers, blow dryers, and curling irons.     If you are interested in getting a wig, talk to your nurse and they can help you get in touch with  programs in your local area.      Food and Drug Interactions     This drug may interact with grapefruit and grapefruit juice. Talk to your doctor as this could make side effects worse.     Check with your doctor or pharmacist about all other prescription medicines and over-the-counter medicines and dietary supplements (vitamins, minerals, herbs, and others) you are taking before starting this medicine as there are known drug interactions with irinotecan. Also, check with your doctor or pharmacist before starting any new prescription or over-the-counter medicines, or dietary supplements to make sure that there are no interactions.     Avoid the use of St. John's Wort while taking irinotecan as this may lower the levels of the drug in your body, which can make it less effective.      When to Call the Doctor    Call your doctor or nurse if you have any of these symptoms and/or any new or unusual symptoms:     Fever of 100.4 F (38 C) or higher     Chills     Pain in your chest      Dry cough     Trouble breathing and/or wheezing     Feeling dizzy or lightheaded     Easy bleeding or bruising     Tiredness or weakness that interferes with your daily activities     Pain in your mouth or throat that makes it hard to eat or drink     Nausea that stops you from eating or drinking and/or is not relieved by prescribed medicines     Throwing up    Diarrhea, 4 times in one day or diarrhea with lack of strength or a feeling of being dizzy     No bowel movement in 3 days or when you feel uncomfortable     Severe abdominal pain that does not go away  Difficulty swallowing     General pain that does not go away, or is not relieved by prescribed medicines     Blood in your stool     Lasting loss of appetite or rapid weight loss of five pounds in a week     Decreased or very dark urine     Signs of allergic reaction: swelling of the face, feeling like your tongue or throat are  swelling, trouble breathing, rash, itching, fever, chills, feeling dizzy, and/or feeling that your heart is beating in a fast or not normal way. If this happens, call 911 for emergency care.     Signs of possible liver problems: dark urine, pale bowel movements, pain in your abdomen, feeling very tired and weak, unusual itching, or yellowing of the eyes or skin     If you think you may be pregnant or may have impregnated your partner      Reproduction Warnings     Pregnancy warning: This drug can have harmful effects on the unborn baby. Women of childbearing potential should use effective methods of birth control during your cancer treatment and for 6 months after treatment. Men with male partners of childbearing potential should use effective methods of birth control during your cancer treatment and for 3 months after your cancer treatment. Let your doctor know right away if you think you may be pregnant or may have impregnated your partner.     Breastfeeding warning: Women should not breastfeed during treatment and for at least 7 days after treatment because this drug can enter the breast milk and cause harm to a breastfeeding baby.  Fertility warning: In men and women both, this drug may affect your ability to have children in the future. Talk with your doctor or nurse if you plan to have children. Ask for information on sperm or egg banking. Revised August 2021     Leucovorin Calcium   About This Drug   Leucovorin is a vitamin. It is used in combination with other cancer fighting drugs such as 5-fluorouracil and methotrexate. Leucovorin is given in the vein (IV).  This drug runs at the same time as the oxaliplatin and takes 2 hours to infuse.    Possible Side Effects  Rash and itching   Note: Leucovorin by itself has very few side effects. Other side effects you may have can be caused by the other drugs you are taking, such as 5-fluorouracil.      Warnings and Precautions     Allergic reactions, including anaphylaxis are rare but may happen in some patients. Signs of allergic reaction to this drug may be swelling of the face, feeling like your tongue or throat are swelling, trouble breathing, rash, itching, fever, chills, feeling dizzy, and/or feeling that your heart is beating in a fast or not normal way. If this happens, do not take another dose of this drug. You should get urgent medical treatment.     Food and Drug Interactions    There are no known interactions of leucovorin with food.    This drug may interact with other medicines. Tell your doctor and pharmacist about all the prescription and over-the-counter medicines and dietary supplements (vitamins, minerals, herbs and others) that you are taking at this time.    Also, check with your doctor or pharmacist before starting any new prescription or over-the-counter medicines, or dietary supplements to make sure that there are no interactions.     When to Call the Doctor   Call  your doctor or nurse if you have any of these symptoms and/or any new or unusual symptoms:    A new rash or a rash that is not relieved by prescribed medicines    Signs of allergic reaction: swelling of the face, feeling like your tongue or throat are swelling, trouble breathing, rash, itching, fever, chills, feeling dizzy, and/or feeling that your heart is beating in a fast or not normal way. If this happens, call 911 for emergency care.    If you think you may be pregnant     Reproduction Warnings    Pregnancy warning: It is not known if this drug may harm an unborn child. For this reason, be sure to talk with your doctor if you are pregnant or planning to become pregnant while receiving this drug. Let your doctor know right away if you think you may be pregnant    Breastfeeding warning: It is not known if this drug passes into breast milk. For this reason, women should talk to their doctor about the risks and benefits of  breastfeeding during treatment with this drug because this drug may enter the breast milk and cause harm to a breastfeeding baby.    Fertility warning: Human fertility studies have not been done with this drug. Talk with your doctor or nurse if you plan to have children. Ask for information on sperm or egg banking.     5-Fluorouracil (Adrucil; 5FU)   About This Drug   Fluorouracil is used to treat cancer. It is given in the vein (IV). It is given as an IV push from a syringe and also as a continuous infusion given via an ambulatory pump (a pump you take home and wear for a specified amount of time).   Possible Side Effects    Bone marrow suppression. This is a decrease in the number of white blood cells, red blood cells, and platelets. This may raise your risk of infection, make you tired and weak (fatigue), and raise your risk of bleeding    Changes in the tissue of the heart and/or heart attack. Some changes may happen that can cause your heart to have less ability to pump blood.    Blurred vision or other changes in eyesight    Nausea and throwing up (vomiting)    Diarrhea (loose bowel movements)    Ulcers - sores that may cause pain or bleeding in your digestive tract, which includes your mouth, esophagus, stomach, small/large intestines and rectum    Soreness of the mouth and throat. You may have red areas, white patches, or sores that hurt.    Allergic reactions, including anaphylaxis are rare but may happen in some patients. Signs of allergic reaction to this drug may be swelling of the face, feeling like your tongue or throat are swelling, trouble breathing, rash, itching, fever, chills, feeling dizzy, and/or feeling that your heart is beating in a fast or not normal way. If this happens, do not take another dose of this drug. You should get urgent medical treatment.    Sensitivity to light (photosensitivity). Photosensitivity means that you may become more sensitive to the sun  and/or light. You may get a skin rash/reaction if you are in the sun or are exposed to sun lamps and tanning beds. Your eyes may water more, mostly in bright light.    Changes in your nail color, nail loss and/or brittle nail    Darkening of the skin, or changes to the color of your skin  and/or veins used for infusion    Rash, dry skin, or itching   Note: Not all possible side effects are included above.   Warnings and Precautions    Hand-and-foot syndrome. The palms of your hands or soles of your feet may tingle, become numb, painful, swollen, or red.    Changes in your central nervous system can happen. The central nervous system is made up of your brain and spinal cord. You could feel extreme tiredness, agitation, confusion, hallucinations (see or hear things that are not there), trouble understanding or speaking, loss of control of your bowels or bladder, eyesight changes, numbness or lack of strength to your arms, legs, face, or body, or coma. If you start to have any of these symptoms let your doctor know right away.    Side effects of this drug may be unexpectedly severe in some patients   Note: Some of the side effects above are very rare. If you have concerns and/or questions, please discuss them with your medical team.     Important Information    This drug may be present in the saliva, tears, sweat, urine, stool, vomit, semen, and vaginal secretions. Talk to your doctor and/or your nurse about the necessary precautions to take during this time.     Treating Side Effects    Manage tiredness by pacing your activities for the day.    Be sure to include periods of rest between energy-draining activities.    To help decrease the risk of infections, wash your hands regularly.    Avoid close contact with people who have a cold, the flu, or other infections.    Take your temperature as your doctor or nurse tells you, and whenever you feel like you may have a fever.    Use a  soft toothbrush. Check with your nurse before using dental floss.    Be very careful when using knives or tools.    Use an electric shaver instead of a razor.    If you have a nose bleed, sit with your head tipped slightly forward. Apply pressure by lightly pinching the bridge of your nose between your thumb and forefinger. Call your doctor if you feel dizzy or faint or if the bleeding doesn't stop after 10 to 15 minutes.    Drink plenty of fluids (a minimum of eight glasses per day is recommended).    If you throw up or have loose bowel movements, you should drink more fluids so that you do not   become dehydrated (lack of water in the body from losing too much fluid).    To help with nausea and vomiting, eat small, frequent meals instead of three large meals a day. Choose foods and drinks that are at room temperature. Ask your nurse or doctor about other helpful tips and medicine that is available to help, stop, or lessen these symptoms.    If you have diarrhea, eat low-fiber foods that are high in protein and calories and avoid foods that can irritate your digestive tracts or lead to cramping.    Ask your nurse or doctor about medicine that can lessen or stop your diarrhea.    Mouth care is very important. Your mouth care should consist of routine, gentle cleaning of your teeth or dentures and rinsing your mouth with a mixture of 1/2 teaspoon of salt in 8 ounces of water or 1/2 teaspoon of baking soda in 8 ounces of water. This should be done at least after each  meal and at bedtime.    If you have mouth sores, avoid mouthwash that has alcohol. Also avoid alcohol and smoking because they can bother your mouth and throat.    Keeping your nails moisturized may help with brittleness.    To help with itching, moisturize your skin several times day.    Use sunscreen with SPF 30 or higher when you are outdoors even for a short time. Cover up when you are out in the sun. Wear wide-brimmed  hats, long-sleeved shirts, and pants. Keep your neck, chest, and back covered. Wear dark sun glasses when in the sun or bright lights.    If you get a rash do not put anything on it unless your doctor or nurse says you may. Keep the area around the rash clean and dry. Ask your doctor for medicine if your rash bothers you.    Keeping your pain under control is important to your well-being. Please tell your doctor or nurse if you are experiencing pain.     Food and Drug Interactions    There are no known interactions of fluorouracil with food.    Check with your doctor or pharmacist about all other prescription medicines and over-the-counter medicines and dietary supplements (vitamins, minerals, herbs and others) you are taking before starting this medicine as there are known drug interactions with 5-fluoroucacil. Also, check with your doctor or pharmacist before starting any new prescription or over-the-counter medicines, or dietary supplements to make sure that there are no interactions.     When to Call the Doctor   Call your doctor or nurse if you have any of these symptoms and/or any new or unusual symptoms:    Fever of 100.4 F (38 C) or higher    Chills    Easy bleeding or bruising    Nose bleed that doesn't stop bleeding after 10-15 minutes    Trouble breathing    Feeling dizzy or lightheaded    Feeling that your heart is beating in a fast or not normal way (palpitations)    Chest pain or symptoms of a heart attack. Most heart attacks involve pain in the center of the chest that lasts more than a few minutes. The pain may go away and come back or it can be constant. It can feel like pressure, squeezing, fullness, or pain. Sometimes pain is felt in one or both arms, the back, neck, jaw, or stomach. If any of these symptoms last 2 minutes, call 911.    Confusion and/or agitation    Hallucinations    Trouble understanding or speaking    Loss of control of bowels or  bladder    Blurry vision or changes in your eyesight    Headache that does not go away    Numbness or lack of strength to your arms, legs, face, or body    Nausea that stops you from eating or drinking and/or is not relieved by prescribed medicines    Throwing up     Diarrhea, 4 times in one day or diarrhea with lack of strength or a feeling of being dizzy    Pain in your mouth or throat that makes it hard to eat or drink    Pain along the digestive tract - especially if worse after eating    Blood in your vomit (bright red or coffee-ground) and/or stools (bright red, or black/tarry)    Coughing up blood    Tiredness that interferes with your daily activities  Painful, red, or swollen areas on your hands or feet or around your nails    A new rash or a rash that is not relieved by prescribed medicines    Develop sensitivity to sunlight/light    Numbness and/or tingling of your hands and/or feet    Signs of allergic reaction: swelling of the face, feeling like your tongue or throat are swelling, trouble breathing, rash, itching, fever, chills, feeling dizzy, and/or feeling that your heart is beating in a fast or not normal way. If this happens, call 911 for emergency care.    If you think you are pregnant or may have impregnated your partner   Reproduction Warnings    Pregnancy warning: This drug may have harmful effects on the unborn baby. Women of child bearing potential should use effective methods of birth control during your cancer treatment and 3 months after treatment. Men with male partners of childbearing potential should use effective methods of birth control during your cancer treatment and for 3 months after your cancer treatment. Let your doctor know right away if you think you may be pregnant or may have impregnated your partner.    Breastfeeding warning: It is not known if this drug passes into breast milk. For this reason, Women should not breastfeed during  treatment because this drug could enter the breast milk and cause harm to a breastfeeding baby.    Fertility warning: In men and women both, this drug may affect your ability to have children in the future. Talk with your doctor or nurse if you plan to have children. Ask for information on sperm or egg banking.   SELF CARE ACTIVITIES WHILE ON CHEMOTHERAPY/IMMUNOTHERAPY:  Hydration Increase your fluid intake and drink at least 64 ounces (2 liters) of water/decaffeinated beverages per day after treatment. You can still have your cup of coffee or soda but these beverages do not count as part of the 64 ounces that you need to drink daily. Limit alcohol intake.  Medications Continue taking your normal prescription medication as prescribed.  If you start any new herbal or new supplements please let us know first to make sure it is safe.  Mouth Care Have teeth cleaned professionally before starting treatment. Keep dentures and partial plates clean. Use soft toothbrush and do not use mouthwashes that contain alcohol. Biotene is a good mouthwash that is available at most pharmacies or may be ordered by calling [phone]. Use warm salt water gargles (1 teaspoon salt per 1 quart warm water) before and after meals and at bedtime. If you are still having problems with your mouth or sores in your mouth please call the clinic. If you need dental work, please let the doctor know before you go for your appointment so that we can coordinate the best possible time for you in regards to your chemo regimen. You need to also let your dentist know that you are actively taking chemo. We may need to do labs prior to your dental appointment.  Skin Care Always use sunscreen that has not expired and with SPF (Sun Protection Factor) of 50 or higher. Wear hats to protect your head from the sun. Remember to use sunscreen on your hands, ears, face, & feet.  Use good moisturizing lotions such as udder cream, eucerin, or even  Vaseline. Some chemotherapies can cause dry skin, color changes in your skin and nails.    Avoid long, hot showers or baths. Use gentle, fragrance-free soaps and laundry detergent. Use moisturizers, preferably creams  or ointments rather than lotions because the thicker consistency is better at preventing skin dehydration. Apply the cream or ointment within 15 minutes of showering. Reapply moisturizer at night, and moisturize your hands every time after you wash them.   Infection Prevention Please wash your hands for at least 30 seconds using warm soapy water. Handwashing is the #1 way to prevent the spread of germs. Stay away from sick people or people who are getting over a cold. If you develop respiratory systems such as green/yellow mucus production or productive cough or persistent cough let us know and we will see if you need an antibiotic. It is a good idea to keep a pair of gloves on when going into grocery stores/[employer] to decrease your risk of coming into contact with germs on the carts, etc. Carry alcohol hand gel with you at all times and use it frequently if out in public. If your temperature reaches 100.5 or higher please call the clinic and let us know.  If it is after hours or on the weekend please go to the ER if your temperature is over 100.4.  Please have your own personal thermometer at home to use.    Sex and bodily fluids If you are going to have sex, a condom must be used to protect the person that isn't taking immunotherapy. For a few days after treatment, immunotherapy can be excreted through your bodily fluids.  When using the toilet please close the lid and flush the toilet twice.  Do this for a few day after you have had immunotherapy.   Contraception It is not known for sure whether or not immunotherapy drugs can be passed on through semen or secretions from the vagina. Because of this some doctors advise people to use a barrier method if you have sex during treatment. This  applies to vaginal, anal or oral sex.  Generally, doctors advise a barrier method only for the time you are actually having the treatment and for about a week after your treatment.  Advice like this can be worrying, but this does not mean that you have to avoid being intimate with your partner. You can still have close contact with your partner and continue to enjoy sex.  Animals If you have cats or birds we ask that you not change the litter or change the cage.  Please have someone else do this for you while you are on immunotherapy.   Food Safety During and After Cancer Treatment Food safety is important for people both during and after cancer treatment. Cancer and cancer treatments, such as chemotherapy, radiation therapy, and stem cell/bone marrow transplantation, often weaken the immune system. This makes it harder for your body to protect itself from foodborne illness, also called food poisoning. Foodborne illness is caused by eating food that contains harmful bacteria, parasites, or viruses.  Foods to avoid Some foods have a higher risk of becoming tainted with bacteria. These include: Unwashed fresh fruit and vegetables, especially leafy vegetables that can hide dirt and other contaminants Raw sprouts, such as alfalfa sprouts Raw or undercooked beef, especially ground beef, or other raw or undercooked meat and poultry Fatty, fried, or spicy foods immediately before or after treatment.  These can sit heavy on your stomach and make you feel nauseous. Raw or undercooked shellfish, such as oysters. Sushi and sashimi, which often contain raw fish.  Unpasteurized beverages, such as unpasteurized fruit juices, raw milk, raw yogurt, or cider Undercooked eggs, such as soft boiled, over  easy, and poached; raw, unpasteurized eggs; or foods made with raw egg, such as homemade raw cookie dough and homemade mayonnaise  Simple steps for food safety  Shop smart. Do not buy food stored or displayed  in an unclean area. Do not buy bruised or damaged fruits or vegetables. Do not buy cans that have cracks, dents, or bulges. Pick up foods that can spoil at the end of your shopping trip and store them in a cooler on the way home.  Prepare and clean up foods carefully. Rinse all fresh fruits and vegetables under running water, and dry them with a clean towel or paper towel. Clean the top of cans before opening them. After preparing food, wash your hands for 20 seconds with hot water and soap. Pay special attention to areas between fingers and under nails. Clean your utensils and dishes with hot water and soap. Disinfect your kitchen and cutting boards using 1 teaspoon of liquid, unscented bleach mixed into 1 quart of water.    Dispose of old food. Eat canned and packaged food before its expiration date (the "use by" or "best before" date). Consume refrigerated leftovers within 3 to 4 days. After that time, throw out the food. Even if the food does not smell or look spoiled, it still may be unsafe. Some bacteria, such as Listeria, can grow even on foods stored in the refrigerator if they are kept for too long.  Take precautions when eating out. At restaurants, avoid buffets and salad bars where food sits out for a long time and comes in contact with many people. Food can become contaminated when someone with a virus, often a norovirus, or another "bug" handles it. Put any leftover food in a "to-go" container yourself, rather than having the server do it. And, refrigerate leftovers as soon as you get home. Choose restaurants that are clean and that are willing to prepare your food as you order it cooked.    SYMPTOMS TO REPORT AS SOON AS POSSIBLE AFTER TREATMENT:  FEVER GREATER THAN 100.4 F CHILLS WITH OR WITHOUT FEVER NAUSEA AND VOMITING THAT IS NOT CONTROLLED WITH YOUR NAUSEA MEDICATION UNUSUAL SHORTNESS OF BREATH UNUSUAL BRUISING OR BLEEDING TENDERNESS IN MOUTH AND THROAT WITH OR WITHOUT  PRESENCE OF ULCERS URINARY PROBLEMS BOWEL PROBLEMS UNUSUAL RASH     Wear comfortable clothing and clothing appropriate for easy access to any Portacath or PICC line. Let us know if there is anything that we can do to make your therapy better!   What to do if you need assistance after hours or on the weekends: CALL [phone].  HOLD on the line, do not hang up.  You will hear multiple messages but at the end you will be connected with a nurse triage line.  They will contact the doctor if necessary.  Most of the time they will be able to assist you.  Do not call the hospital operator.    I have been informed and understand all of the instructions given to me and have received a copy. I have been instructed to call the clinic [phone] or my family physician as soon as possible for continued medical care, if indicated. I do not have any more questions at this time but understand that I may call the Cancer Center or the Patient Navigator at [phone] during office hours should I have questions or need assistance in obtaining follow-up care.

## 2023-05-12 ENCOUNTER — Inpatient Hospital Stay: Payer: Medicare HMO

## 2023-05-12 ENCOUNTER — Other Ambulatory Visit: Payer: Self-pay | Admitting: Internal Medicine

## 2023-05-12 DIAGNOSIS — K219 Gastro-esophageal reflux disease without esophagitis: Secondary | ICD-10-CM

## 2023-05-12 NOTE — Progress Notes (Signed)
 Patient no showed for iron infusion and chemo teaching. Call placed to son, Laban Emperor, who states that the patient and family have decided after further conversation not to pursue treatment. All future appointments cancelled. Patient referred to Hospice per Dr. Ellin Saba.

## 2023-05-14 ENCOUNTER — Other Ambulatory Visit: Payer: Self-pay | Admitting: Internal Medicine

## 2023-05-14 DIAGNOSIS — G894 Chronic pain syndrome: Secondary | ICD-10-CM

## 2023-05-14 DIAGNOSIS — M48062 Spinal stenosis, lumbar region with neurogenic claudication: Secondary | ICD-10-CM

## 2023-05-14 NOTE — Telephone Encounter (Unsigned)
 Copied from CRM [id number]. Topic: Clinical - Medication Refill >> May 14, 2023  3:06 PM Santiya F wrote: Most Recent Primary Care Visit:  Provider: Anabel Halon  Department: [facility]  Visit Type: OFFICE VISIT  Date: 04/21/2023  Medication: Oxycodone HCl 10 MG TABS [[id number]]  Has the patient contacted their pharmacy? Yes  (Agent: If yes, when and what did the pharmacy advise?) contact office   Is this the correct pharmacy for this prescription? Yes  This is the patient's preferred pharmacy:  [facility] - [place], [place] - [address] [place] [address] Phone: [phone] Fax: [phone]   Has the prescription been filled recently? Yes  Is the patient out of the medication? Yes  Has the patient been seen for an appointment in the last year OR does the patient have an upcoming appointment? Yes  Can we respond through MyChart? No  Agent: Please be advised that Rx refills may take up to 3 business days. We ask that you follow-up with your pharmacy.    **Patient's son is calling in because he says patient is out of medication and is in pain. Darrell wants to know if this medication can be refilled today, and can a nurse or Dr. Allena Katz call him today.**

## 2023-05-14 NOTE — Telephone Encounter (Signed)
 Last Fill: 04/28/23 30 tabs/0 RF  Last OV: 12/17/22 Next OV: 07/21/23  Routing to provider for review/authorization.

## 2023-05-15 ENCOUNTER — Inpatient Hospital Stay: Payer: Medicare HMO

## 2023-05-15 ENCOUNTER — Ambulatory Visit: Payer: Medicare HMO

## 2023-05-15 ENCOUNTER — Inpatient Hospital Stay: Payer: Medicare HMO | Admitting: Dietician

## 2023-05-15 MED ORDER — OXYCODONE HCL 10 MG PO TABS: 15.0000 mg | ORAL_TABLET | Freq: Three times a day (TID) | ORAL | 0 refills | Status: DC | PRN

## 2023-05-20 ENCOUNTER — Ambulatory Visit (INDEPENDENT_AMBULATORY_CARE_PROVIDER_SITE_OTHER): Admitting: Internal Medicine

## 2023-05-20 ENCOUNTER — Other Ambulatory Visit (HOSPITAL_COMMUNITY): Payer: Medicare HMO

## 2023-05-20 ENCOUNTER — Ambulatory Visit (HOSPITAL_COMMUNITY): Payer: Medicare HMO

## 2023-05-20 ENCOUNTER — Encounter: Payer: Self-pay | Admitting: Internal Medicine

## 2023-05-20 VITALS — BP 132/82 | HR 89 | Ht 68.0 in | Wt 206.8 lb

## 2023-05-20 DIAGNOSIS — C183 Malignant neoplasm of hepatic flexure: Secondary | ICD-10-CM | POA: Diagnosis not present

## 2023-05-20 DIAGNOSIS — M62838 Other muscle spasm: Secondary | ICD-10-CM | POA: Insufficient documentation

## 2023-05-20 DIAGNOSIS — C787 Secondary malignant neoplasm of liver and intrahepatic bile duct: Secondary | ICD-10-CM

## 2023-05-20 DIAGNOSIS — G894 Chronic pain syndrome: Secondary | ICD-10-CM | POA: Diagnosis not present

## 2023-05-20 DIAGNOSIS — M48062 Spinal stenosis, lumbar region with neurogenic claudication: Secondary | ICD-10-CM | POA: Diagnosis not present

## 2023-05-20 MED ORDER — OXYCODONE HCL 20 MG PO TABS
1.0000 | ORAL_TABLET | Freq: Three times a day (TID) | ORAL | 0 refills | Status: DC
Start: 2023-06-02 — End: 2023-06-24

## 2023-05-20 MED ORDER — CYCLOBENZAPRINE HCL 5 MG PO TABS
5.0000 mg | ORAL_TABLET | Freq: Two times a day (BID) | ORAL | 1 refills | Status: DC | PRN
Start: 2023-05-20 — End: 2023-06-11

## 2023-05-20 NOTE — Patient Instructions (Signed)
 Please start taking Oxycodone 20 mg 3 times daily.  Please take Flexeril as needed for muscle spasms.

## 2023-05-21 ENCOUNTER — Other Ambulatory Visit (HOSPITAL_COMMUNITY): Payer: Self-pay

## 2023-05-21 ENCOUNTER — Inpatient Hospital Stay: Payer: Medicare HMO | Admitting: Hematology

## 2023-05-21 ENCOUNTER — Inpatient Hospital Stay: Payer: Medicare HMO

## 2023-05-21 ENCOUNTER — Encounter (HOSPITAL_COMMUNITY): Payer: Self-pay | Admitting: Hematology

## 2023-05-21 ENCOUNTER — Telehealth: Payer: Self-pay | Admitting: Pharmacy Technician

## 2023-05-21 NOTE — Telephone Encounter (Signed)
 Pharmacy Patient Advocate Encounter  Received notification from [facility] that Prior Authorization for CYCLOBENZAPRINE 5MG  TABLETS has been APPROVED from 05/21/2023 to 03/10/2024. Ran test claim, Copay is $1.00. This test claim was processed through [facility]- copay amounts may vary at other pharmacies due to pharmacy/plan contracts, or as the patient moves through the different stages of their insurance plan.   PA #/Case ID/Reference #: [id number]

## 2023-05-21 NOTE — Telephone Encounter (Signed)
 Pharmacy Patient Advocate Encounter   Received notification from Patient Pharmacy that prior authorization for CYCLOBENZAPRINE 5MG  TABLETS is required/requested.   Insurance verification completed.   The patient is insured through [employer] .   Per test claim: PA required; PA submitted to above mentioned insurance via CoverMyMeds Key/confirmation #/EOC [id number] Status is pending

## 2023-05-23 ENCOUNTER — Inpatient Hospital Stay: Attending: Hematology | Admitting: Licensed Clinical Social Worker

## 2023-05-23 ENCOUNTER — Inpatient Hospital Stay: Payer: Medicare HMO

## 2023-05-23 DIAGNOSIS — C787 Secondary malignant neoplasm of liver and intrahepatic bile duct: Secondary | ICD-10-CM | POA: Insufficient documentation

## 2023-05-23 DIAGNOSIS — C183 Malignant neoplasm of hepatic flexure: Secondary | ICD-10-CM

## 2023-05-23 NOTE — Progress Notes (Signed)
 Established Patient Office Visit  Subjective:  Patient ID: Wayne Middleton, male    DOB: [birth year]  Age: 83 y.o. MRN: [id number]  CC:  Chief Complaint  Patient presents with   Care Management    Follow up, reports pain on his right shoulder/neck area. Also has shakiness in his hands.     HPI Wayne Middleton is a 83 y.o. male with past medical history of atrial fibrillation, HFpEF, HTN, COPD, peripheral neuropathy, lumbar spinal stenosis s/p lumbar surgeries, chronic pain syndrome, MDD, colon carcinoma and lymphedema who presents for follow-up after recent hospitalization.  He was admitted at [facility] from 04/22/23-04/28/23 for anemia. Was given PRBC transfusions. 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 He was referred to Oncology, but has decided to pursue palliative chemotherapy. Denies melena or hematochezia. He still feels fatigued and has gait disturbance.  He reports neck pain, which is chronic, constant, radiating towards bilateral UE and has had jerking movements of the bilateral hands.  He prefers to avoid any further investigation of neck pain for now.  He has chronic numbness and tingling of the UE, takes Lyrica 200 mg twice daily for it.   Past Medical History:  Diagnosis Date   Acute metabolic encephalopathy 04/17/2022   Acute respiratory failure (HCC) 04/09/2022   Atrial fibrillation (HCC)    CAP (community acquired pneumonia) 04/09/2022   Cardiomyopathy (HCC)    Tachycardia mediated, LVEF improved to 50-55% as of 2012   Cholelithiasis    Chronic anticoagulation    Chronic anxiety    Diabetes (HCC)    Essential hypertension    History of GI bleed 2011   Hospitalized for lower GI bleed following polypectomy   Peripheral neuropathy 11/11/2018    Past Surgical History:  Procedure Laterality Date   BACK SURGERY  03/11/1969   post-trauma   BIOPSY  04/26/2023   Procedure: BIOPSY;  Surgeon: Franky Macho, MD;  Location: [facility];  Service: Endoscopy;;   COLONOSCOPY W/ POLYPECTOMY  2006, 2011   postprocedure bleed requiring hospital admission   COLONOSCOPY WITH PROPOFOL N/A 04/26/2023   Procedure: COLONOSCOPY WITH PROPOFOL;  Surgeon: Franky Macho, MD;  Location: [facility];  Service: Endoscopy;  Laterality: N/A;   ESOPHAGOGASTRODUODENOSCOPY (EGD) WITH PROPOFOL N/A 04/26/2023   Procedure: ESOPHAGOGASTRODUODENOSCOPY (EGD) WITH PROPOFOL;  Surgeon: Franky Macho, MD;  Location: [facility];  Service: Endoscopy;  Laterality: N/A;   POLYPECTOMY  04/26/2023   Procedure: POLYPECTOMY;  Surgeon: Franky Macho, MD;  Location: [facility];  Service: Endoscopy;;   TUMOR EXCISION  1960s   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    Family History  Problem Relation Age of Onset   Stroke Mother 43   Heart attack Father 79   Lung cancer Brother        3 of 5 brothers deceased due to Carcinoma of the lung    Social History   Socioeconomic History   Marital status: Widowed    Spouse name: Not on file   Number of children: Not on file   Years of education: Not on file   Highest education level: Not on file  Occupational History   Occupation: Retired from [employer]  Tobacco Use   Smoking status: Never   Smokeless tobacco: Never  Vaping Use   Vaping status: Never Used  Substance and Sexual Activity   Alcohol use: No    Alcohol/week: 0.0 standard drinks of  alcohol   Drug use: No   Sexual activity: Not Currently  Other Topics Concern   Not on file  Social History Narrative   Patient previously walked four miles a day   Social Drivers of Health   Financial Resource Strain: Not on file  Food Insecurity: No Food Insecurity (04/23/2023)   Hunger Vital Sign    Worried About Running Out of Food in the Last Year: Never true    Ran Out of Food in the Last Year: Never true  Transportation Needs: No Transportation Needs (04/23/2023)   PRAPARE - Administrator, Civil Service  (Medical): No    Lack of Transportation (Non-Medical): No  Physical Activity: Not on file  Stress: Not on file  Social Connections: Moderately Integrated (04/23/2023)   Social Connection and Isolation Panel [NHANES]    Frequency of Communication with Friends and Family: More than three times a week    Frequency of Social Gatherings with Friends and Family: More than three times a week    Attends Religious Services: More than 4 times per year    Active Member of [employer] or Organizations: Yes    Attends Engineer, structural: More than 4 times per year    Marital Status: Divorced  Intimate Partner Violence: Not At Risk (04/23/2023)   Humiliation, Afraid, Rape, and Kick questionnaire    Fear of Current or Ex-Partner: No    Emotionally Abused: No    Physically Abused: No    Sexually Abused: No    Outpatient Medications Prior to Visit  Medication Sig Dispense Refill   allopurinol (ZYLOPRIM) 300 MG tablet Take 1 tablet (300 mg total) by mouth daily. 30 tablet 6   atenolol (TENORMIN) 25 MG tablet Take 1 tablet (25 mg total) by mouth daily. 90 tablet 3   cetirizine (ZYRTEC) 10 MG tablet Take 10 mg by mouth daily.     dexamethasone 20 mg in sodium chloride 0.9 % 50 mL Inject 20 mg into the vein once.     dexamethasone in sodium chloride 0.9 % 50 mL Inject into the vein once.     diltiazem (CARDIZEM CD) 240 MG 24 hr capsule Take 1 capsule (240 mg total) by mouth daily. 90 capsule 1   FLUoxetine (PROZAC) 40 MG capsule Take 1 capsule (40 mg total) by mouth daily. 90 capsule 1   fluticasone (FLONASE) 50 MCG/ACT nasal spray Place 1 spray into both nostrils daily. 48 g 3   Fosaprepitant Dimeglumine (EMEND IV) Inject into the vein.     furosemide (LASIX) 40 MG tablet TAKE 1 TABLET EVERY DAY 90 tablet 3   meclizine (ANTIVERT) 25 MG tablet TAKE 1 TABLET THREE TIMES DAILY AS NEEDED FOR DIZZINESS (Patient taking differently: Take 25 mg by mouth 2 (two) times daily as needed for nausea or dizziness.)  90 tablet 2   megestrol (MEGACE) 400 MG/10ML suspension Take 10 mLs (400 mg total) by mouth 2 (two) times daily. 480 mL 3   ondansetron (ZOFRAN) 4 MG tablet TAKE ONE TABLET BY MOUTH EVERY 8 HOURS AS NEEDED FOR NAUSEA AND VOMITING 30 tablet 1   Palonosetron HCl (ALOXI IV) Inject into the vein.     pantoprazole (PROTONIX) 40 MG tablet Take 1 tablet (40 mg total) by mouth daily. 90 tablet 1   potassium chloride (MICRO-K) 10 MEQ CR capsule Take 1 capsule (10 mEq total) by mouth daily. 30 capsule 1   pregabalin (LYRICA) 200 MG capsule Take 1 capsule (200 mg total)  by mouth 2 (two) times daily. 60 capsule 3   zolpidem (AMBIEN) 10 MG tablet Take 1 tablet (10 mg total) by mouth at bedtime as needed for sleep. 30 tablet 2   Oxycodone HCl 10 MG TABS Take 1.5 tablets (15 mg total) by mouth every 8 (eight) hours as needed (severe pain). 135 tablet 0   No facility-administered medications prior to visit.    No Known Allergies  ROS Review of Systems  Constitutional:  Positive for fatigue. Negative for chills and fever.  HENT:  Negative for congestion, postnasal drip, sinus pressure and sore throat.   Eyes:  Negative for pain and discharge.  Respiratory:  Negative for cough and shortness of breath.   Cardiovascular:  Positive for leg swelling. Negative for chest pain and palpitations.  Gastrointestinal:  Negative for diarrhea, nausea and vomiting.  Endocrine: Negative for polydipsia and polyuria.  Genitourinary:  Negative for dysuria and hematuria.  Musculoskeletal:  Positive for arthralgias, back pain, neck pain and neck stiffness.  Skin:  Positive for color change (Redness over bilateral legs, chronic). Negative for rash.  Neurological:  Positive for weakness and numbness (B/l hands). Negative for headaches.  Psychiatric/Behavioral:  Positive for sleep disturbance. Negative for agitation and behavioral problems. The patient is nervous/anxious.       Objective:    Physical Exam Vitals reviewed.   Constitutional:      General: He is not in acute distress.    Appearance: He is not diaphoretic.     Comments: In wheelchair  HENT:     Head: Normocephalic and atraumatic.     Nose: Nose normal.     Mouth/Throat:     Mouth: Mucous membranes are moist.  Eyes:     General: No scleral icterus.    Extraocular Movements: Extraocular movements intact.  Cardiovascular:     Rate and Rhythm: Normal rate and regular rhythm.     Heart sounds: Normal heart sounds. No murmur heard. Pulmonary:     Breath sounds: Normal breath sounds. No wheezing or rales.  Musculoskeletal:     Cervical back: Neck supple. No tenderness.     Right lower leg: Edema (Trace) present.     Left lower leg: Edema (Trace) present.  Skin:    General: Skin is warm.     Findings: Rash (Erythema over bilateral legs up to knee, with excoriations) present.  Neurological:     General: No focal deficit present.     Mental Status: He is alert and oriented to person, place, and time.     Sensory: Sensory deficit (Bilateral feet and hands) present.     Motor: Weakness (B/l LE - 3/5) present.  Psychiatric:        Mood and Affect: Mood normal.        Behavior: Behavior normal.     BP 132/82   Pulse 89   Ht 5\' 8"  (1.727 m)   Wt 206 lb 12.8 oz (93.8 kg)   SpO2 94%   BMI 31.44 kg/m  Wt Readings from Last 3 Encounters:  05/20/23 206 lb 12.8 oz (93.8 kg)  04/22/23 191 lb 12.8 oz (87 kg)  04/21/23 191 lb 12.8 oz (87 kg)    Lab Results  Component Value Date   TSH 1.530 08/30/2022   Lab Results  Component Value Date   WBC 5.9 05/05/2023   HGB 8.5 (L) 05/05/2023   HCT 31.7 (L) 05/05/2023   MCV 70.3 (L) 05/05/2023   PLT 276 05/05/2023   Lab  Results  Component Value Date   NA 136 04/24/2023   K 3.5 04/24/2023   CO2 24 04/24/2023   GLUCOSE 115 (H) 04/24/2023   BUN 13 04/24/2023   CREATININE 1.05 04/24/2023   BILITOT 0.8 04/22/2023   ALKPHOS 75 04/22/2023   AST 16 04/22/2023   ALT 14 04/22/2023   PROT 6.9  04/22/2023   ALBUMIN 3.2 (L) 04/22/2023   CALCIUM 8.6 (L) 04/24/2023   ANIONGAP 9 04/24/2023   EGFR 56 (L) 04/21/2023   Lab Results  Component Value Date   CHOL 98 (L) 08/30/2022   Lab Results  Component Value Date   HDL 29 (L) 08/30/2022   Lab Results  Component Value Date   LDLCALC 51 08/30/2022   Lab Results  Component Value Date   TRIG 92 08/30/2022   Lab Results  Component Value Date   CHOLHDL 3.4 08/30/2022   Lab Results  Component Value Date   HGBA1C 6.8 (H) 04/21/2023      Assessment & Plan:   Problem List Items Addressed This Visit       Digestive   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) - Primary   Had recurrent anemia, was noncompliant with medical advice to get GI consultation in outpatient setting Recent admission for recurrent anemia, had colonoscopy in 02/25 which showed malignant colon carcinoma Has been evaluated by GI and oncology-has decided to do palliative chemotherapy      Metastasis to liver [facility])   Recent CT abdomen showed metastasis to liver and lungs, has primary colon carcinoma Has been evaluated by GI and oncology-has decided to do palliative chemotherapy        Musculoskeletal and Integument   Muscle spasms of neck   Likely has muscular strain in addition to DDD of cervical spine Flexeril as needed for muscle spasms Continue Lyrica for neuropathic symptoms      Relevant Medications   cyclobenzaprine (FLEXERIL) 5 MG tablet     Other   Chronic pain syndrome   Due to lumbar spine surgeries Has history of lumbar spinal stenosis On oxycodone 15 mg 3 times daily- he has chronic severe pain, due to recent diagnosis of cancer and chronic pain, increased dose of oxycodone to 20 mg TID PRN On Lyrica 200 mg BID for neuropathic pain      Relevant Medications   Oxycodone HCl 20 MG TABS (Start on 06/02/2023)   cyclobenzaprine (FLEXERIL) 5 MG tablet   Spinal stenosis of lumbar region with neurogenic claudication   S/p  lumbar spine surgeries Has chronic low back pain with radicular symptoms On oxycodone and Lyrica currently      Relevant Medications   Oxycodone HCl 20 MG TABS (Start on 06/02/2023)   cyclobenzaprine (FLEXERIL) 5 MG tablet    Meds ordered this encounter  Medications   Oxycodone HCl 20 MG TABS    Sig: Take 1 tablet (20 mg total) by mouth in the morning, at noon, and at bedtime.    Dispense:  90 tablet    Refill:  0    Dose change   cyclobenzaprine (FLEXERIL) 5 MG tablet    Sig: Take 1 tablet (5 mg total) by mouth 2 (two) times daily as needed for muscle spasms.    Dispense:  30 tablet    Refill:  1    Follow-up: No follow-ups on file.    Anabel Halon, MD

## 2023-05-23 NOTE — Assessment & Plan Note (Signed)
 Recent CT abdomen showed metastasis to liver and lungs, has primary colon carcinoma Has been evaluated by GI and oncology-has decided to do palliative chemotherapy

## 2023-05-23 NOTE — Progress Notes (Signed)
 [facility] CSW Progress Note  Visual merchandiser  received a call from pt's son inquiring if there are services pt qualifies for to assist in the home.  A hospice referral was placed for pt last week.  CSW inquired if hospice services are in place.  Per pt's son the hospice agency is scheduled to come to the house today at 4:30 to complete an intake.  CSW encouraged pt's son to be present for that meeting if possible.  Pt has two sons who are currently taking turns staying w/ pt who is reportedly physically declining quickly.  CSW advised pt's son to inform the hospice agency of any limits they may have to ensure hospice services are maximized, also informing that inpatient hospice may be an option if necessary as pt declines further.  CSW to remain available to provide support as appropriate.        Rachel Moulds, LCSW Clinical Social Worker [facility]

## 2023-05-23 NOTE — Assessment & Plan Note (Signed)
 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

## 2023-05-23 NOTE — Assessment & Plan Note (Signed)
S/p lumbar spine surgeries Has chronic low back pain with radicular symptoms On oxycodone and Lyrica currently

## 2023-05-23 NOTE — Assessment & Plan Note (Signed)
 Likely has muscular strain in addition to DDD of cervical spine Flexeril as needed for muscle spasms Continue Lyrica for neuropathic symptoms

## 2023-05-23 NOTE — Assessment & Plan Note (Signed)
 Had recurrent anemia, was noncompliant with medical advice to get GI consultation in outpatient setting Recent admission for recurrent anemia, had colonoscopy in 02/25 which showed malignant colon carcinoma Has been evaluated by GI and oncology-has decided to do palliative chemotherapy

## 2023-06-03 ENCOUNTER — Telehealth: Payer: Self-pay | Admitting: Internal Medicine

## 2023-06-03 NOTE — Telephone Encounter (Signed)
 Patient's son advised, Thank you.

## 2023-06-03 NOTE — Telephone Encounter (Signed)
 Please advise     Copied from CRM [id number]. Topic: General - Other >> Jun 03, 2023 12:12 PM Wayne Middleton wrote: Reason for CRM: The patient is calling to see if a Child psychotherapist is coming to his home today. I told the patient I didn't see any appointments scheduled for today. I did send a message to Wayne Middleton an LCSW at [facility] Middleton [facility] but she didn't respond. Patient is requesting a call back at [phone]

## 2023-06-04 ENCOUNTER — Inpatient Hospital Stay: Payer: Medicare HMO | Admitting: Hematology

## 2023-06-04 ENCOUNTER — Inpatient Hospital Stay: Payer: Medicare HMO

## 2023-06-06 ENCOUNTER — Inpatient Hospital Stay: Payer: Medicare HMO

## 2023-06-09 ENCOUNTER — Ambulatory Visit: Payer: Self-pay

## 2023-06-09 NOTE — Telephone Encounter (Signed)
 Copied from CRM [id number]. Topic: Clinical - Red Word Triage >> Jun 09, 2023  2:20 PM Alessandra Bevels wrote: Red Word that prompted transfer to Nurse Triage: Patients son is calling for advice - as they are trying to support that the patient. Patient leg below the knee cap is numb is causing the patient to fall a lot even though there is a walker. Raiford Noble has to call EMS to come pick up the patient off the floor.  Patient is under hospice care at home. Patient is urinating on himself, there is no power of attorney, there is no living will. The patient does not wish to leave his home. But is needing more support. Does DSS need to get involved? Answer Assessment - Initial Assessment Questions 1. REASON FOR CALL or QUESTION: "What is your reason for calling today?" or "How can I best help you?" or "What question do you have that I can help answer?"     Had questions regarding hospice care instructed to call hospice for answers.  Protocols used: Information Only Call - No Triage-A-AH

## 2023-06-11 ENCOUNTER — Other Ambulatory Visit: Payer: Self-pay | Admitting: Internal Medicine

## 2023-06-11 DIAGNOSIS — M48062 Spinal stenosis, lumbar region with neurogenic claudication: Secondary | ICD-10-CM

## 2023-06-11 DIAGNOSIS — M62838 Other muscle spasm: Secondary | ICD-10-CM

## 2023-06-18 ENCOUNTER — Inpatient Hospital Stay: Payer: Medicare HMO

## 2023-06-18 ENCOUNTER — Telehealth: Payer: Self-pay | Admitting: Internal Medicine

## 2023-06-18 ENCOUNTER — Other Ambulatory Visit: Payer: Self-pay

## 2023-06-18 ENCOUNTER — Inpatient Hospital Stay: Payer: Medicare HMO | Admitting: Hematology

## 2023-06-18 NOTE — Telephone Encounter (Signed)
Patient son picked up letter.

## 2023-06-18 NOTE — Telephone Encounter (Signed)
 Patient son Clide Cliff came by office, his father is at the hospice house and they need a medical summary statement with provider date and signature that his father not able to stand, he is bedwritten, can not make his own decisions on his own due to he has no POA.  Call patient at [phone].

## 2023-06-18 NOTE — Telephone Encounter (Signed)
 Lmtrc to let him know letter is ready

## 2023-06-20 ENCOUNTER — Inpatient Hospital Stay: Payer: Medicare HMO

## 2023-07-02 ENCOUNTER — Inpatient Hospital Stay: Payer: Medicare HMO

## 2023-07-02 ENCOUNTER — Inpatient Hospital Stay: Payer: Medicare HMO | Admitting: Hematology

## 2023-07-04 ENCOUNTER — Inpatient Hospital Stay: Payer: Medicare HMO

## 2023-07-10 DEATH — deceased

## 2023-07-21 ENCOUNTER — Ambulatory Visit: Payer: Medicare HMO | Admitting: Internal Medicine
